# Patient Record
Sex: Female | Born: 1965
Health system: Southern US, Community
[De-identification: ages and names within clinical notes are randomized; demographics above are authoritative.]

## PROBLEM LIST (undated history)

## (undated) DIAGNOSIS — E559 Vitamin D deficiency, unspecified: Secondary | ICD-10-CM

## (undated) DIAGNOSIS — Z8489 Family history of other specified conditions: Secondary | ICD-10-CM

## (undated) DIAGNOSIS — R232 Flushing: Secondary | ICD-10-CM

## (undated) DIAGNOSIS — T7840XA Allergy, unspecified, initial encounter: Secondary | ICD-10-CM

## (undated) DIAGNOSIS — M1990 Unspecified osteoarthritis, unspecified site: Secondary | ICD-10-CM

## (undated) DIAGNOSIS — I10 Essential (primary) hypertension: Secondary | ICD-10-CM

## (undated) DIAGNOSIS — E876 Hypokalemia: Secondary | ICD-10-CM

## (undated) HISTORY — PX: CATARACT EXTRACTION: SUR2

## (undated) HISTORY — PX: WISDOM TOOTH EXTRACTION: SHX21

## (undated) HISTORY — PX: HERNIA REPAIR: SHX51

---

## 2010-03-16 ENCOUNTER — Emergency Department (HOSPITAL_COMMUNITY): Admission: EM | Admit: 2010-03-16 | Discharge: 2010-03-16 | Payer: Self-pay | Admitting: Family Medicine

## 2011-10-04 ENCOUNTER — Emergency Department (INDEPENDENT_AMBULATORY_CARE_PROVIDER_SITE_OTHER)
Admission: EM | Admit: 2011-10-04 | Discharge: 2011-10-04 | Disposition: A | Discharge status: Home / Self Care | Payer: 59 | Source: Home / Self Care

## 2011-10-04 DIAGNOSIS — T148XXA Other injury of unspecified body region, initial encounter: Secondary | ICD-10-CM

## 2011-10-04 DIAGNOSIS — M25569 Pain in unspecified knee: Secondary | ICD-10-CM

## 2011-10-04 HISTORY — DX: Essential (primary) hypertension: I10

## 2011-10-04 MED ORDER — CYCLOBENZAPRINE HCL 5 MG PO TABS: 5.0000 mg | ORAL_TABLET | Freq: Three times a day (TID) | ORAL | Status: AC | PRN

## 2011-10-04 NOTE — ED Provider Notes (Signed)
Medical screening examination/treatment/procedure(s) were performed by non-physician practitioner and as supervising physician I was immediately available for consultation/collaboration.  Raynald Blend, MD 10/04/11 2110
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
## 2013-03-24 ENCOUNTER — Encounter: Payer: Self-pay | Admitting: Nurse Practitioner

## 2013-03-24 ENCOUNTER — Ambulatory Visit (INDEPENDENT_AMBULATORY_CARE_PROVIDER_SITE_OTHER): Payer: 59 | Admitting: Nurse Practitioner

## 2013-03-24 VITALS — BP 122/58 | HR 66 | Ht 64.5 in | Wt 224.6 lb

## 2013-03-24 DIAGNOSIS — Z0000 Encounter for general adult medical examination without abnormal findings: Secondary | ICD-10-CM

## 2013-03-24 DIAGNOSIS — Z01419 Encounter for gynecological examination (general) (routine) without abnormal findings: Secondary | ICD-10-CM

## 2013-03-24 DIAGNOSIS — T8339XA Other mechanical complication of intrauterine contraceptive device, initial encounter: Secondary | ICD-10-CM

## 2013-03-24 DIAGNOSIS — T8332XA Displacement of intrauterine contraceptive device, initial encounter: Secondary | ICD-10-CM

## 2013-03-24 DIAGNOSIS — Z30433 Encounter for removal and reinsertion of intrauterine contraceptive device: Secondary | ICD-10-CM

## 2013-03-24 LAB — COMPREHENSIVE METABOLIC PANEL
AST: 15 U/L (ref 0–37)
Albumin: 3.7 g/dL (ref 3.5–5.2)
Alkaline Phosphatase: 57 U/L (ref 39–117)
BUN: 12 mg/dL (ref 6–23)
Potassium: 3.6 mEq/L (ref 3.5–5.3)
Sodium: 136 mEq/L (ref 135–145)
Total Bilirubin: 0.4 mg/dL (ref 0.3–1.2)
Total Protein: 6.6 g/dL (ref 6.0–8.3)

## 2013-03-24 LAB — LIPID PANEL
LDL Cholesterol: 107 mg/dL — ABNORMAL HIGH (ref 0–99)
VLDL: 30 mg/dL (ref 0–40)

## 2013-03-24 LAB — POCT URINALYSIS DIPSTICK
Leukocytes, UA: NEGATIVE
pH, UA: 5.5

## 2013-03-24 LAB — TSH: TSH: 2.703 u[IU]/mL (ref 0.350–4.500)

## 2013-03-24 NOTE — Patient Instructions (Signed)

## 2013-03-24 NOTE — Progress Notes (Signed)
47 y.o. G2. Single African American Fe here for annual exam.  Same partner for 4 years .Menses is regular lasting 7 days, moderate to light, some cramps. Has IUD for over 10 years.  (inserted after last pregnancy about 4-6 months later - per history last pregnancy was 1985). She is unsure as to which type of IUD and does not check her strings. She has no vaso symptoms.  Patient's last menstrual period was 03/14/2013.          Sexually active: yes  The current method of family planning is IUD.    Exercising: yes  walking Smoker:  no  Health Maintenance: Pap:  2012 normal MMG:  2012 normal done in [facility] Colonoscopy:  never TDaP: within 6 years per pt.  Labs: PCP does lab (blood) work.  Dr. Renae Fickle in [place]   reports that she has never smoked. She has never used smokeless tobacco. She reports that she does not drink alcohol or use illicit drugs.  Past Medical History  Diagnosis Date  . Hypertension   . Asthma     Past Surgical History  Procedure Laterality Date  . Cesarean section    . Hernia repair    . Wisdom tooth extraction      Current Outpatient Prescriptions  Medication Sig Dispense Refill  . aspirin 81 MG tablet Take 81 mg by mouth daily.      Marland Kitchen glucosamine-chondroitin 500-400 MG tablet Take 1 tablet by mouth 3 (three) times daily.      Marland Kitchen losartan-hydrochlorothiazide (HYZAAR) 50-12.5 MG per tablet Take 50 tablets by mouth.      . Mag Aspart-Potassium Aspart (POTASSIUM & MAGNESIUM ASPARTAT) 250-250 MG CAPS Take 10 mg by mouth.      . vitamin B-12 (CYANOCOBALAMIN) 100 MCG tablet Take 50 mcg by mouth daily.      Marland Kitchen olmesartan (BENICAR) 20 MG tablet Take 20 mg by mouth daily.         No current facility-administered medications for this visit.    Family History  Problem Relation Age of Onset  . Diabetes Mother   . Hypertension Mother   . Hypertension Father     ROS:  Pertinent items are noted in HPI.  Otherwise, a comprehensive ROS was negative.  Exam:    BP 122/58  Pulse 66  Ht 5' 4.5" (1.638 m)  Wt 224 lb 9.6 oz (101.878 kg)  BMI 37.97 kg/m2  LMP 03/14/2013 Height: 5' 4.5" (163.8 cm)  Ht Readings from Last 3 Encounters:  03/24/13 5' 4.5" (1.638 m)    General appearance: alert, cooperative and appears stated age Head: Normocephalic, without obvious abnormality, atraumatic Neck: no adenopathy, supple, symmetrical, trachea midline and thyroid normal to inspection and palpation Lungs: clear to auscultation bilaterally Breasts: normal appearance, no masses or tenderness Heart: regular rate and rhythm Abdomen: soft, non-tender; no masses,  no organomegaly, presence of a fairly large umbilical hernia Extremities: extremities normal, atraumatic, no cyanosis or edema Skin: Skin color, texture, turgor normal. No rashes or lesions Lymph nodes: Cervical, supraclavicular, and axillary nodes normal. No abnormal inguinal nodes palpated Neurologic: Grossly normal   Pelvic: External genitalia:  no lesions              Urethra:  normal appearing urethra with no masses, tenderness or lesions              Bartholin's and Skene's: normal  Vagina: normal appearing vagina with normal color and discharge, no lesions              Cervix: multiparous appearance, nabothian cyst and no IUD strings found after trying numerous times              Pap taken: yes Bimanual Exam:  Uterus:  normal size, contour, position, consistency, mobility, non-tender              Adnexa: no mass, fullness, tenderness               Rectovaginal: Confirms               Anus:  normal sphincter tone, no lesions  A:  Well Woman with normal exam  S/P Para guard IUD since 1985 needs removal and replacement  Unable to find IUD string  S/P umbilical hernia with past surgical repair with mesh with failure  HTN  P:   Pap smear as per guidelines   Mammogram due now  Follow for labs and send results to PCP  Will Schedule PUS for confirmation of IUD and removal with  replacement  counseled on breast self exam, family planning choices, adequate intake of calcium and vitamin D,   diet and exercise return annually or prn  An After Visit Summary was printed and given to the patient.

## 2013-03-25 ENCOUNTER — Telehealth: Payer: Self-pay | Admitting: *Deleted

## 2013-03-25 NOTE — Telephone Encounter (Signed)
Pt is aware of all lab results and will begin to take Vitamin D 2000 IU po qd (otc).

## 2013-03-25 NOTE — Telephone Encounter (Signed)
Message copied by Osie Bond on Fri Mar 25, 2013  9:12 AM ------      Message from: Roanna Banning      Created: Fri Mar 25, 2013  8:23 AM       Let patient know lab results. Her triglyceride is elevated so needs to watch foods that are high is sugars, TSH is noraml, Vit D was a little low start Vit D 2000 IU daily. Liver and kidney function test are normal. She wanted a copy for PCP. ------

## 2013-03-25 NOTE — Progress Notes (Signed)
Reviewed personally. 

## 2013-03-25 NOTE — Telephone Encounter (Signed)
Message copied by Osie Bond on Fri Mar 25, 2013  8:54 AM ------      Message from: Roanna Banning      Created: Fri Mar 25, 2013  8:23 AM       Let patient know lab results. Her triglyceride is elevated so needs to watch foods that are high is sugars, TSH is noraml, Vit D was a little low start Vit D 2000 IU daily. Liver and kidney function test are normal. She wanted a copy for PCP. ------

## 2013-03-25 NOTE — Telephone Encounter (Signed)
LVM for pt to return my call in regards to lab results.  

## 2013-03-28 ENCOUNTER — Telehealth: Payer: Self-pay | Admitting: Nurse Practitioner

## 2013-03-28 NOTE — Telephone Encounter (Signed)
Patient wants to know if she can schedule procedure a couple months out ahead of time coinciding with her cycle schedule?

## 2013-03-28 NOTE — Telephone Encounter (Signed)
LVM advising 100% covered and to call with her cycle to schedule during 1st 5 days.

## 2013-03-30 ENCOUNTER — Telehealth: Payer: Self-pay | Admitting: Nurse Practitioner

## 2013-03-30 NOTE — Telephone Encounter (Signed)
Needs to talk to Cherlyn Cushing nurse about ultrasound

## 2013-03-30 NOTE — Telephone Encounter (Signed)
Please call her she has questions about ultrasound wants to speak with [facility]

## 2013-03-31 ENCOUNTER — Telehealth: Payer: Self-pay | Admitting: *Deleted

## 2013-03-31 NOTE — Telephone Encounter (Signed)
Pt had question's in regards to IUD placement-scheduling. I advised pt she needs to be having her menses at insertion. Pt is agreeable and will call office at next menses.

## 2013-03-31 NOTE — Telephone Encounter (Signed)
LVM for pt to return my call-returning pt's call.

## 2013-04-18 ENCOUNTER — Telehealth: Payer: Self-pay | Admitting: Nurse Practitioner

## 2013-04-18 NOTE — Telephone Encounter (Signed)
LMTCB  aa    (Need to tell her that her Pap was normal.)

## 2013-04-18 NOTE — Telephone Encounter (Signed)
Patient has not gotten results from her PAP.  03-24-2013. Please call with results.

## 2013-04-18 NOTE — Telephone Encounter (Signed)
Spoke with pt to let her know Pap was normal. Pt requests copy be mailed to her.

## 2013-04-20 ENCOUNTER — Telehealth: Payer: Self-pay | Admitting: Nurse Practitioner

## 2013-04-20 NOTE — Telephone Encounter (Signed)
Patient needs her appointment for mammogram

## 2013-04-20 NOTE — Telephone Encounter (Signed)
LMTCB  aa 

## 2013-04-21 ENCOUNTER — Other Ambulatory Visit: Payer: Self-pay | Admitting: Nurse Practitioner

## 2013-04-21 DIAGNOSIS — Z1231 Encounter for screening mammogram for malignant neoplasm of breast: Secondary | ICD-10-CM

## 2013-04-21 NOTE — Telephone Encounter (Signed)
LM on pt's VM [facility] at [facility] am for [facility]G. Advised she will need to make sure it has been at least 365 days since her previous MMG as of Dec 4 in order for insurance to pay for it. Advised I would mail out an appt slip with a map to [facility] on the back. Pt to have old films transferred prior to appt. Pt to call back with any questions.

## 2013-04-21 NOTE — Telephone Encounter (Signed)
Spoke with pt about MMG appt. Pt had last one done at [facility] in [place], but would like to have next one done locally in December. Advised pt to call and have old films transferred for comparison. Will try [facility] for [facility] appt on Dec 4 per pt request.

## 2013-06-01 ENCOUNTER — Telehealth: Payer: Self-pay | Admitting: Nurse Practitioner

## 2013-06-01 NOTE — Telephone Encounter (Signed)
6/2 LMTCB to discuss insurance benefits and schedule pus guided IUD removal/insertion.  7/2LMTCB 8/6 LMTCB & message to Patty.

## 2013-06-02 NOTE — Telephone Encounter (Signed)
Patient returned my call. She wanted to let me know that she does not want to move forward with having an ultrasound to locate her IUD or to have it removed/replaced. Per Dr. Hyacinth Meeker,  I informed the patient that a letter was stuck in the mail today explaining that she has 30 days to make the appointment or we will not be able to provide her care anymore. The patient was slightly frustrated and felt "pressured" into having the procedure. I assured her that we are just trying to provide adequate care and make sure that the IUD is not causing any damage. She asked if she could have the scan but not the procedure. I informed her that if she had the scan and the IUD was not in the proper place it would have to be removed and replaced in order to prevent further damage. The patient ultimately decided that she will just find another doctor that will not make her have this scan and possible procedure completed.

## 2013-06-08 ENCOUNTER — Telehealth: Payer: Self-pay | Admitting: Nurse Practitioner

## 2013-06-23 NOTE — Telephone Encounter (Signed)
error 

## 2013-09-01 ENCOUNTER — Other Ambulatory Visit: Payer: Self-pay

## 2013-09-29 ENCOUNTER — Ambulatory Visit
Admission: RE | Admit: 2013-09-29 | Discharge: 2013-09-29 | Disposition: A | Discharge status: Home / Self Care | Payer: 59 | Source: Ambulatory Visit | Attending: Nurse Practitioner | Admitting: Nurse Practitioner

## 2013-09-29 DIAGNOSIS — Z1231 Encounter for screening mammogram for malignant neoplasm of breast: Secondary | ICD-10-CM

## 2013-09-30 ENCOUNTER — Other Ambulatory Visit: Payer: Self-pay | Admitting: Nurse Practitioner

## 2013-09-30 DIAGNOSIS — R928 Other abnormal and inconclusive findings on diagnostic imaging of breast: Secondary | ICD-10-CM

## 2013-10-17 ENCOUNTER — Other Ambulatory Visit: Payer: Self-pay | Admitting: Nurse Practitioner

## 2013-10-17 ENCOUNTER — Other Ambulatory Visit: Payer: Self-pay

## 2013-10-17 DIAGNOSIS — R928 Other abnormal and inconclusive findings on diagnostic imaging of breast: Secondary | ICD-10-CM

## 2013-10-21 ENCOUNTER — Ambulatory Visit
Admission: RE | Admit: 2013-10-21 | Discharge: 2013-10-21 | Disposition: A | Discharge status: Home / Self Care | Payer: 59 | Source: Ambulatory Visit | Attending: Nurse Practitioner | Admitting: Nurse Practitioner

## 2013-10-21 DIAGNOSIS — R928 Other abnormal and inconclusive findings on diagnostic imaging of breast: Secondary | ICD-10-CM

## 2014-01-09 ENCOUNTER — Other Ambulatory Visit: Payer: Self-pay | Admitting: Family Medicine

## 2014-01-09 DIAGNOSIS — N63 Unspecified lump in unspecified breast: Secondary | ICD-10-CM

## 2014-04-24 ENCOUNTER — Ambulatory Visit
Admission: RE | Admit: 2014-04-24 | Discharge: 2014-04-24 | Disposition: A | Discharge status: Home / Self Care | Payer: 59 | Source: Ambulatory Visit | Attending: Family Medicine | Admitting: Family Medicine

## 2014-04-24 ENCOUNTER — Other Ambulatory Visit: Payer: Self-pay | Admitting: Family Medicine

## 2014-04-24 DIAGNOSIS — N63 Unspecified lump in unspecified breast: Secondary | ICD-10-CM

## 2014-04-24 DIAGNOSIS — R921 Mammographic calcification found on diagnostic imaging of breast: Secondary | ICD-10-CM

## 2014-08-28 ENCOUNTER — Encounter: Payer: Self-pay | Admitting: Nurse Practitioner

## 2014-10-24 ENCOUNTER — Other Ambulatory Visit: Payer: Self-pay | Admitting: Family Medicine

## 2014-10-24 ENCOUNTER — Ambulatory Visit
Admission: RE | Admit: 2014-10-24 | Discharge: 2014-10-24 | Disposition: A | Discharge status: Home / Self Care | Payer: 59 | Source: Ambulatory Visit | Attending: Family Medicine | Admitting: Family Medicine

## 2014-10-24 DIAGNOSIS — N63 Unspecified lump in unspecified breast: Secondary | ICD-10-CM

## 2014-10-25 ENCOUNTER — Other Ambulatory Visit: Payer: Self-pay

## 2014-10-25 DIAGNOSIS — Z1231 Encounter for screening mammogram for malignant neoplasm of breast: Secondary | ICD-10-CM

## 2015-10-26 ENCOUNTER — Ambulatory Visit
Admission: RE | Admit: 2015-10-26 | Discharge: 2015-10-26 | Disposition: A | Discharge status: Home / Self Care | Payer: 59 | Source: Ambulatory Visit

## 2015-10-26 DIAGNOSIS — Z1231 Encounter for screening mammogram for malignant neoplasm of breast: Secondary | ICD-10-CM

## 2016-05-08 ENCOUNTER — Other Ambulatory Visit: Payer: Self-pay | Admitting: Family Medicine

## 2016-05-08 DIAGNOSIS — Z1231 Encounter for screening mammogram for malignant neoplasm of breast: Secondary | ICD-10-CM

## 2016-11-06 DIAGNOSIS — M546 Pain in thoracic spine: Secondary | ICD-10-CM | POA: Diagnosis not present

## 2016-11-06 DIAGNOSIS — M5442 Lumbago with sciatica, left side: Secondary | ICD-10-CM | POA: Diagnosis not present

## 2016-11-06 DIAGNOSIS — M5136 Other intervertebral disc degeneration, lumbar region: Secondary | ICD-10-CM | POA: Diagnosis not present

## 2016-11-19 ENCOUNTER — Ambulatory Visit (INDEPENDENT_AMBULATORY_CARE_PROVIDER_SITE_OTHER): Payer: 59 | Admitting: Sports Medicine

## 2016-11-19 ENCOUNTER — Ambulatory Visit
Admission: RE | Admit: 2016-11-19 | Discharge: 2016-11-19 | Disposition: A | Discharge status: Home / Self Care | Payer: 59 | Source: Ambulatory Visit | Attending: Family Medicine | Admitting: Family Medicine

## 2016-11-19 ENCOUNTER — Ambulatory Visit (INDEPENDENT_AMBULATORY_CARE_PROVIDER_SITE_OTHER): Payer: 59

## 2016-11-19 ENCOUNTER — Encounter: Payer: Self-pay | Admitting: Sports Medicine

## 2016-11-19 DIAGNOSIS — M2142 Flat foot [pes planus] (acquired), left foot: Secondary | ICD-10-CM | POA: Diagnosis not present

## 2016-11-19 DIAGNOSIS — M2141 Flat foot [pes planus] (acquired), right foot: Secondary | ICD-10-CM | POA: Diagnosis not present

## 2016-11-19 DIAGNOSIS — Z1231 Encounter for screening mammogram for malignant neoplasm of breast: Secondary | ICD-10-CM

## 2016-11-19 DIAGNOSIS — M722 Plantar fascial fibromatosis: Secondary | ICD-10-CM

## 2016-11-19 MED ORDER — TRIAMCINOLONE ACETONIDE 40 MG/ML IJ SUSP: 20.0000 mg | Freq: Once | INTRAMUSCULAR | Status: AC

## 2016-11-19 NOTE — Progress Notes (Signed)
Subjective: Lisa Everett is a 51 y.o. female patient presents to office with complaint of heel pain on the right>left. Patient admits to post static dyskinesia for 2 months in duration. Patient has treated this problem with change in shoes and inserts which help some. Denies any other pedal complaints.   There are no active problems to display for this patient.   Current Outpatient Prescriptions on File Prior to Visit  Medication Sig Dispense Refill  . aspirin 81 MG tablet Take 81 mg by mouth daily.    Marland Kitchen. glucosamine-chondroitin 500-400 MG tablet Take 1 tablet by mouth 3 (three) times daily.    Marland Kitchen. losartan-hydrochlorothiazide (HYZAAR) 50-12.5 MG per tablet Take 50 tablets by mouth.    . Mag Aspart-Potassium Aspart (POTASSIUM & MAGNESIUM ASPARTAT) 250-250 MG CAPS Take 10 mg by mouth.    . vitamin B-12 (CYANOCOBALAMIN) 100 MCG tablet Take 50 mcg by mouth daily.     No current facility-administered medications on file prior to visit.     No Known Allergies  Objective: Physical Exam General: The patient is alert and oriented x3 in no acute distress.  Dermatology: Skin is warm, dry and supple bilateral lower extremities. Nails 1-10 are normal. There is no erythema, edema, no eccymosis, no open lesions present. Integument is otherwise unremarkable.  Vascular: Dorsalis Pedis pulse and Posterior Tibial pulse are 2/4 bilateral. Capillary fill time is immediate to all digits.  Neurological: Grossly intact to light touch with an achilles reflex of +2/5 and a negative Tinel's sign bilateral.  Musculoskeletal: Tenderness to palpation at the medial calcaneal tubercale and through the insertion of the plantar fascia on the right foot. No pain to left. No pain with compression of calcaneus bilateral. No pain with tuning fork to calcaneus bilateral. No pain with calf compression bilateral. There is decreased Ankle joint range of motion bilateral. All other joints range of motion within normal limits  bilateral. Strength 5/5 in all groups bilateral.   Gait: Unassisted, Antalgic avoid weight on right heel  Xray, Right and Left foot:  Normal osseous mineralization. Joint spaces preserved. Pes planus foot type. No fracture/dislocation/boney destruction. Calcaneal spur present with mild thickening of plantar fascia R>L. No other soft tissue abnormalities or radiopaque foreign bodies.   Assessment and Plan: Problem List Items Addressed This Visit    None    Visit Diagnoses    Plantar fasciitis, bilateral    -  Primary   R>L   Relevant Orders   DG Foot 2 Views Left   DG Foot 2 Views Right   Plantar fasciitis of right foot       Relevant Medications   triamcinolone acetonide (KENALOG-40) injection 20 mg (Start on 11/19/2016 11:45 AM)   Pes planus of both feet          -Complete examination performed.  -Xrays reviewed -Discussed with patient in detail the condition of plantar fasciitis, how this occurs and general treatment options. Explained both conservative and surgical treatments.  -After oral consent and aseptic prep, injected a mixture containing 1 ml of 2% plain lidocaine, 1 ml 0.5% plain marcaine, 0.5 ml of kenalog 40 and 0.5 ml of dexamethasone phosphate into right heel. Post-injection care discussed with patient.  -Oral medication will be considered in moderation at next visit if symptoms are still present since hx of HTN -Recommended good supportive shoes and advised use of OTC insert. Explained to patient that if these orthoses work well, we will continue with these. If these do not improve her  condition and  pain, we will consider custom molded orthoses. - Explained in detail the use of the fascial brace for right and left which was dispensed at today's visit. -Explained and dispensed to patient daily stretching exercises. -Recommend patient to ice affected area 1-2x daily. -Patient to return to office in 3-4 weeks for follow up or sooner if problems or questions  arise.  Asencion Islam, DPM

## 2016-11-19 NOTE — Patient Instructions (Addendum)
Plantar Fasciitis Rehab Ask your health care provider which exercises are safe for you. Do exercises exactly as told by your health care provider and adjust them as directed. It is normal to feel mild stretching, pulling, tightness, or discomfort as you do these exercises, but you should stop right away if you feel sudden pain or your pain gets worse. Do not begin these exercises until told by your health care provider. Stretching and range of motion exercises These exercises warm up your muscles and joints and improve the movement and flexibility of your foot. These exercises also help to relieve pain. Exercise A: Plantar fascia stretch 1. Sit with your left / right leg crossed over your opposite knee. 2. Hold your heel with one hand with that thumb near your arch. With your other hand, hold your toes and gently pull them back toward the top of your foot. You should feel a stretch on the bottom of your toes or your foot or both. 3. Hold this stretch for__________ seconds. 4. Slowly release your toes and return to the starting position. Repeat __________ times. Complete this exercise __________ times a day. Exercise B: Gastroc, standing 1. Stand with your hands against a wall. 2. Extend your left / right leg behind you, and bend your front knee slightly. 3. Keeping your heels on the floor and keeping your back knee straight, shift your weight toward the wall without arching your back. You should feel a gentle stretch in your left / right calf. 4. Hold this position for __________ seconds. Repeat __________ times. Complete this exercise __________ times a day. Exercise C: Soleus, standing 1. Stand with your hands against a wall. 2. Extend your left / right leg behind you, and bend your front knee slightly. 3. Keeping your heels on the floor, bend your back knee and slightly shift your weight over the back leg. You should feel a gentle stretch deep in your calf. 4. Hold this position for __________  seconds. Repeat __________ times. Complete this exercise __________ times a day. Exercise D: Gastrocsoleus, standing 1. Stand with the ball of your left / right foot on a step. The ball of your foot is on the walking surface, right under your toes. 2. Keep your other foot firmly on the same step. 3. Hold onto the wall or a railing for balance. 4. Slowly lift your other foot, allowing your body weight to press your heel down over the edge of the step. You should feel a stretch in your left / right calf. 5. Hold this position for __________ seconds. 6. Return both feet to the step. 7. Repeat this exercise with a slight bend in your left / right knee. Repeat __________ times with your left / right knee straight and __________ times with your left / right knee bent. Complete this exercise __________ times a day. Balance exercise This exercise builds your balance and strength control of your arch to help take pressure off your plantar fascia. Exercise E: Single leg stand 1. Without shoes, stand near a railing or in a doorway. You may hold onto the railing or door frame as needed. 2. Stand on your left / right foot. Keep your big toe down on the floor and try to keep your arch lifted. Do not let your foot roll inward. 3. Hold this position for __________ seconds. 4. If this exercise is too easy, you can try it with your eyes closed or while standing on a pillow. Repeat __________ times. Complete this exercise   __________ times a day. This information is not intended to replace advice given to you by your health care provider. Make sure you discuss any questions you have with your health care provider. Document Released: 10/13/2005 Document Revised: 06/17/2016 Document Reviewed: 08/27/2015 Elsevier Interactive Patient Education  2017 [employer]  For tennis shoes recommend:  Anne ShutterBrooks Beast Ascis New balance Saucony Can be purchased at [employer] or [employer]  Vionic  SAS Can be purchased  at [employer] or [employer]   For work shoes recommend: [employer]ket[employer] Work Ryland [employer]berland boots  Can be purchased at a variety of places or Scientist, product/process developmenthoe Market   For casual shoes recommend: Vionic  Can be purchased at [employer] or [employer]   Over The counter Inserts Airplus- [email or web address] Superfeet- Omega sports or Fleetfeet Powersteps- Can be purchased from Dr. Wynema BirchStover's office

## 2016-11-22 DIAGNOSIS — Z79899 Other long term (current) drug therapy: Secondary | ICD-10-CM | POA: Diagnosis not present

## 2016-12-04 DIAGNOSIS — R6889 Other general symptoms and signs: Secondary | ICD-10-CM | POA: Diagnosis not present

## 2016-12-08 DIAGNOSIS — J189 Pneumonia, unspecified organism: Secondary | ICD-10-CM | POA: Diagnosis not present

## 2016-12-08 DIAGNOSIS — J301 Allergic rhinitis due to pollen: Secondary | ICD-10-CM | POA: Diagnosis not present

## 2016-12-10 ENCOUNTER — Ambulatory Visit: Payer: 59 | Admitting: Sports Medicine

## 2016-12-12 DIAGNOSIS — J189 Pneumonia, unspecified organism: Secondary | ICD-10-CM | POA: Diagnosis not present

## 2016-12-12 DIAGNOSIS — E538 Deficiency of other specified B group vitamins: Secondary | ICD-10-CM | POA: Diagnosis not present

## 2016-12-12 DIAGNOSIS — E785 Hyperlipidemia, unspecified: Secondary | ICD-10-CM | POA: Diagnosis not present

## 2016-12-12 DIAGNOSIS — E559 Vitamin D deficiency, unspecified: Secondary | ICD-10-CM | POA: Diagnosis not present

## 2016-12-12 DIAGNOSIS — J209 Acute bronchitis, unspecified: Secondary | ICD-10-CM | POA: Diagnosis not present

## 2016-12-18 DIAGNOSIS — M546 Pain in thoracic spine: Secondary | ICD-10-CM | POA: Diagnosis not present

## 2016-12-18 DIAGNOSIS — M5136 Other intervertebral disc degeneration, lumbar region: Secondary | ICD-10-CM | POA: Diagnosis not present

## 2016-12-18 DIAGNOSIS — M5442 Lumbago with sciatica, left side: Secondary | ICD-10-CM | POA: Diagnosis not present

## 2017-01-05 DIAGNOSIS — I10 Essential (primary) hypertension: Secondary | ICD-10-CM | POA: Diagnosis not present

## 2017-01-05 DIAGNOSIS — K439 Ventral hernia without obstruction or gangrene: Secondary | ICD-10-CM | POA: Diagnosis not present

## 2017-01-05 DIAGNOSIS — R1033 Periumbilical pain: Secondary | ICD-10-CM | POA: Diagnosis not present

## 2017-01-15 DIAGNOSIS — M5136 Other intervertebral disc degeneration, lumbar region: Secondary | ICD-10-CM | POA: Diagnosis not present

## 2017-01-15 DIAGNOSIS — M5442 Lumbago with sciatica, left side: Secondary | ICD-10-CM | POA: Diagnosis not present

## 2017-01-15 DIAGNOSIS — M546 Pain in thoracic spine: Secondary | ICD-10-CM | POA: Diagnosis not present

## 2017-02-12 DIAGNOSIS — M546 Pain in thoracic spine: Secondary | ICD-10-CM | POA: Diagnosis not present

## 2017-02-12 DIAGNOSIS — M5442 Lumbago with sciatica, left side: Secondary | ICD-10-CM | POA: Diagnosis not present

## 2017-02-12 DIAGNOSIS — M5136 Other intervertebral disc degeneration, lumbar region: Secondary | ICD-10-CM | POA: Diagnosis not present

## 2017-03-07 DIAGNOSIS — Z79899 Other long term (current) drug therapy: Secondary | ICD-10-CM | POA: Diagnosis not present

## 2017-03-07 DIAGNOSIS — R5383 Other fatigue: Secondary | ICD-10-CM | POA: Diagnosis not present

## 2017-03-07 DIAGNOSIS — E559 Vitamin D deficiency, unspecified: Secondary | ICD-10-CM | POA: Diagnosis not present

## 2017-03-07 DIAGNOSIS — R0602 Shortness of breath: Secondary | ICD-10-CM | POA: Diagnosis not present

## 2017-03-12 DIAGNOSIS — M5136 Other intervertebral disc degeneration, lumbar region: Secondary | ICD-10-CM | POA: Diagnosis not present

## 2017-03-12 DIAGNOSIS — M5442 Lumbago with sciatica, left side: Secondary | ICD-10-CM | POA: Diagnosis not present

## 2017-03-12 DIAGNOSIS — M546 Pain in thoracic spine: Secondary | ICD-10-CM | POA: Diagnosis not present

## 2017-04-18 DIAGNOSIS — E559 Vitamin D deficiency, unspecified: Secondary | ICD-10-CM | POA: Diagnosis not present

## 2017-04-18 DIAGNOSIS — I10 Essential (primary) hypertension: Secondary | ICD-10-CM | POA: Diagnosis not present

## 2017-04-18 DIAGNOSIS — E785 Hyperlipidemia, unspecified: Secondary | ICD-10-CM | POA: Diagnosis not present

## 2017-06-14 DIAGNOSIS — E782 Mixed hyperlipidemia: Secondary | ICD-10-CM | POA: Diagnosis not present

## 2017-06-14 DIAGNOSIS — Z79899 Other long term (current) drug therapy: Secondary | ICD-10-CM | POA: Diagnosis not present

## 2017-06-18 ENCOUNTER — Encounter (HOSPITAL_COMMUNITY): Payer: Self-pay | Admitting: Emergency Medicine

## 2017-06-18 ENCOUNTER — Ambulatory Visit (HOSPITAL_COMMUNITY)
Admission: EM | Admit: 2017-06-18 | Discharge: 2017-06-18 | Disposition: A | Discharge status: Home / Self Care | Payer: 59 | Attending: Emergency Medicine | Admitting: Emergency Medicine

## 2017-06-18 DIAGNOSIS — I10 Essential (primary) hypertension: Secondary | ICD-10-CM

## 2017-06-18 NOTE — ED Triage Notes (Signed)
PT reports she saw a doctor Sunday and her BP was 144/100. PT has had flucutations all week. Highest systolic reading was 185. Highest diastolic reading has been 113. PT reports she has felt "foggy".   PT's BP meds were switched 2-3 weeks ago. She used to take Losartan/HCTZ 50-12.5 mg.  She switched to Losartan 50mg . She was having issues with low potassium and wanted to drop the HCTZ.  PT went back to Losartan with HCTZ Tuesday due to high BP readings.

## 2017-06-18 NOTE — Discharge Instructions (Signed)
Continue taking Losartan HCT and follow up with PCP in next few weeks

## 2017-06-18 NOTE — ED Provider Notes (Signed)
  [facility]   [id number] 06/18/17 Arrival Time: 1808  ASSESSMENT & PLAN:  1. Essential hypertension     No orders of the defined types were placed in this encounter.   Reviewed expectations re: course of current medical issues. Questions answered. Outlined signs and symptoms indicating need for more acute intervention. Patient verbalized understanding. After Visit Summary given.  Explained to stay on losartan hct and follow up with pcp in next few weeks.  Try and eat banana or tomatoes.  SUBJECTIVE:  Lisa Everett is a 51 y.o. female who presents with complaint of having elevated bp readings on her losartan.  She started to take losartan hct.  ROS: As per HPI.   OBJECTIVE:  Vitals:   06/18/17 1858 06/18/17 1859  BP:  133/90  Pulse:  100  Resp:  16  Temp:  98.5 F (36.9 C)  TempSrc:  Oral  SpO2:  100%  Weight: 200 lb (90.7 kg)   Height: 5\' 2"  (1.575 m)      General appearance: alert; no distress Eyes: PERRLA; EOMI; conjunctiva normal HENT: normocephalic; atraumatic; TMs normal; nasal mucosa normal; oral mucosa normal Neck: supple Lungs: clear to auscultation bilaterally Heart: regular rate and rhythm Abdomen: soft, non-tender; bowel sounds normal; no masses or organomegaly; no guarding or rebound tenderness Back: no CVA tenderness Extremities: no cyanosis or edema; symmetrical with no gross deformities Skin: warm and dry Neurologic: normal gait; normal symmetric reflexes Psychological: alert and cooperative; normal mood and affect  Past Medical History:  Diagnosis Date  . Asthma   . Hypertension      has a past medical history of Asthma and Hypertension.    Labs Reviewed - No data to display  Imaging: No results found.  No Known Allergies  Family History  Problem Relation Age of Onset  . Diabetes Mother   . Hypertension Mother   . Hypertension Father   . Osteoarthritis Sister   . Diabetes Paternal Grandmother   . Diabetes  Paternal Aunt    Past Surgical History:  Procedure Laterality Date  . CESAREAN SECTION    . HERNIA REPAIR    . WISDOM TOOTH EXTRACTION           Deatra Canter, [place] 06/18/17 [phone]

## 2017-06-24 DIAGNOSIS — E559 Vitamin D deficiency, unspecified: Secondary | ICD-10-CM | POA: Diagnosis not present

## 2017-06-24 DIAGNOSIS — I10 Essential (primary) hypertension: Secondary | ICD-10-CM | POA: Diagnosis not present

## 2017-06-24 DIAGNOSIS — E785 Hyperlipidemia, unspecified: Secondary | ICD-10-CM | POA: Diagnosis not present

## 2017-08-01 DIAGNOSIS — E559 Vitamin D deficiency, unspecified: Secondary | ICD-10-CM | POA: Diagnosis not present

## 2017-08-01 DIAGNOSIS — E538 Deficiency of other specified B group vitamins: Secondary | ICD-10-CM | POA: Diagnosis not present

## 2017-08-01 DIAGNOSIS — I10 Essential (primary) hypertension: Secondary | ICD-10-CM | POA: Diagnosis not present

## 2017-10-03 DIAGNOSIS — E559 Vitamin D deficiency, unspecified: Secondary | ICD-10-CM | POA: Diagnosis not present

## 2017-10-03 DIAGNOSIS — E785 Hyperlipidemia, unspecified: Secondary | ICD-10-CM | POA: Diagnosis not present

## 2017-10-03 DIAGNOSIS — I10 Essential (primary) hypertension: Secondary | ICD-10-CM | POA: Diagnosis not present

## 2017-10-03 DIAGNOSIS — E539 Vitamin B deficiency, unspecified: Secondary | ICD-10-CM | POA: Diagnosis not present

## 2017-12-12 DIAGNOSIS — E785 Hyperlipidemia, unspecified: Secondary | ICD-10-CM | POA: Diagnosis not present

## 2017-12-12 DIAGNOSIS — K439 Ventral hernia without obstruction or gangrene: Secondary | ICD-10-CM | POA: Diagnosis not present

## 2017-12-12 DIAGNOSIS — I10 Essential (primary) hypertension: Secondary | ICD-10-CM | POA: Diagnosis not present

## 2017-12-16 ENCOUNTER — Other Ambulatory Visit: Payer: Self-pay | Admitting: Internal Medicine

## 2017-12-16 DIAGNOSIS — Z139 Encounter for screening, unspecified: Secondary | ICD-10-CM

## 2017-12-25 ENCOUNTER — Other Ambulatory Visit (HOSPITAL_COMMUNITY): Payer: Self-pay | Admitting: Surgery

## 2017-12-25 DIAGNOSIS — K430 Incisional hernia with obstruction, without gangrene: Secondary | ICD-10-CM

## 2018-01-04 ENCOUNTER — Encounter (HOSPITAL_COMMUNITY): Payer: Self-pay

## 2018-01-04 ENCOUNTER — Ambulatory Visit (HOSPITAL_COMMUNITY)
Admission: RE | Admit: 2018-01-04 | Discharge: 2018-01-04 | Disposition: A | Discharge status: Home / Self Care | Payer: 59 | Source: Ambulatory Visit | Attending: Surgery | Admitting: Surgery

## 2018-01-04 ENCOUNTER — Ambulatory Visit
Admission: RE | Admit: 2018-01-04 | Discharge: 2018-01-04 | Disposition: A | Discharge status: Home / Self Care | Payer: 59 | Source: Ambulatory Visit | Attending: Internal Medicine | Admitting: Internal Medicine

## 2018-01-04 DIAGNOSIS — Z1231 Encounter for screening mammogram for malignant neoplasm of breast: Secondary | ICD-10-CM | POA: Diagnosis not present

## 2018-01-04 DIAGNOSIS — K430 Incisional hernia with obstruction, without gangrene: Secondary | ICD-10-CM | POA: Insufficient documentation

## 2018-01-04 DIAGNOSIS — K439 Ventral hernia without obstruction or gangrene: Secondary | ICD-10-CM | POA: Diagnosis not present

## 2018-01-04 DIAGNOSIS — K5730 Diverticulosis of large intestine without perforation or abscess without bleeding: Secondary | ICD-10-CM | POA: Insufficient documentation

## 2018-01-04 DIAGNOSIS — Z139 Encounter for screening, unspecified: Secondary | ICD-10-CM

## 2018-01-04 MED ORDER — IOPAMIDOL (ISOVUE-300) INJECTION 61%
100.0000 mL | Freq: Once | INTRAVENOUS | Status: AC | PRN
  Administered 2018-01-04: 100 mL via INTRAVENOUS

## 2018-01-04 MED ORDER — IOPAMIDOL (ISOVUE-300) INJECTION 61%
INTRAVENOUS | Status: AC
  Administered 2018-01-04: 100 mL via INTRAVENOUS
  Filled 2018-01-04: qty 100

## 2018-01-04 MED ORDER — SODIUM CHLORIDE 0.9 % IJ SOLN
INTRAMUSCULAR | Status: AC
  Filled 2018-01-04: qty 50

## 2018-01-27 ENCOUNTER — Encounter: Payer: Self-pay | Admitting: Podiatry

## 2018-01-27 ENCOUNTER — Ambulatory Visit: Payer: 59 | Admitting: Podiatry

## 2018-01-27 DIAGNOSIS — M722 Plantar fascial fibromatosis: Secondary | ICD-10-CM | POA: Diagnosis not present

## 2018-01-27 DIAGNOSIS — Z6836 Body mass index (BMI) 36.0-36.9, adult: Secondary | ICD-10-CM | POA: Diagnosis not present

## 2018-01-27 DIAGNOSIS — K430 Incisional hernia with obstruction, without gangrene: Secondary | ICD-10-CM | POA: Diagnosis not present

## 2018-01-27 DIAGNOSIS — Z01419 Encounter for gynecological examination (general) (routine) without abnormal findings: Secondary | ICD-10-CM | POA: Diagnosis not present

## 2018-01-27 MED ORDER — MELOXICAM 15 MG PO TABS: 15.0000 mg | ORAL_TABLET | Freq: Every day | ORAL | 1 refills | Status: DC

## 2018-01-29 ENCOUNTER — Telehealth: Payer: Self-pay | Admitting: Podiatry

## 2018-01-29 NOTE — Telephone Encounter (Signed)
Left message for pt to call to discuss orthotic benefit coverage. °

## 2018-01-29 NOTE — Progress Notes (Signed)
   Subjective:  52 year old female presenting today with a chief complaint of right heel pain and flat feet bilaterally. She has been diagnosed with plantar fasciitis in the past and believes she is having a flare up. The first steps out of bed in the morning are the worst but walking and standing for long periods of time also increase the pain. She states the injection she has received in the past have helped alleviate the pain for a few months. Patient is here for further evaluation and treatment.   Past Medical History:  Diagnosis Date  . Asthma   . Hypertension       Objective/Physical Exam General: The patient is alert and oriented x3 in no acute distress.  Dermatology: Skin is warm, dry and supple bilateral lower extremities. Negative for open lesions or macerations.  Vascular: Palpable pedal pulses bilaterally. No edema or erythema noted. Capillary refill within normal limits.  Neurological: Epicritic and protective threshold grossly intact bilaterally.   Musculoskeletal Exam: Range of motion within normal limits to all pedal and ankle joints bilateral. Muscle strength 5/5 in all groups bilateral.  Upon weightbearing there is a medial longitudinal arch collapse bilaterally. Remove foot valgus noted to the bilateral lower extremities with excessive pronation upon mid stance. Tenderness to palpation to the plantar aspect of the right heel along the plantar fascia.  Assessment: 1. pes planus bilateral 2. pain in bilateral feet 3. plantar fasciitis right    Plan of Care:  1. Patient was evaluated. 2. Injection of 0.5 mLs Celestone Soluspan injected into the right heel.  3. Prescription for Meloxicam provided to patient.  4. Orthotics department to check orthotics coverage. 5. Continue wearing plantar fascial brace. 6. Return to clinic as needed.   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    Felecia ShellingBrent M. Yuka Lallier, DPM [facility]  Dr. Felecia ShellingBrent M. Porsha Skilton, DPM      [address]                                        [place], [place] [address]                Office [phone]  Fax [phone]

## 2018-02-06 DIAGNOSIS — I10 Essential (primary) hypertension: Secondary | ICD-10-CM | POA: Diagnosis not present

## 2018-02-06 DIAGNOSIS — E538 Deficiency of other specified B group vitamins: Secondary | ICD-10-CM | POA: Diagnosis not present

## 2018-02-06 DIAGNOSIS — E559 Vitamin D deficiency, unspecified: Secondary | ICD-10-CM | POA: Diagnosis not present

## 2018-02-06 DIAGNOSIS — E785 Hyperlipidemia, unspecified: Secondary | ICD-10-CM | POA: Diagnosis not present

## 2018-02-18 ENCOUNTER — Encounter (HOSPITAL_COMMUNITY): Payer: Self-pay | Admitting: Emergency Medicine

## 2018-02-18 ENCOUNTER — Other Ambulatory Visit: Payer: Self-pay

## 2018-02-18 ENCOUNTER — Ambulatory Visit (HOSPITAL_COMMUNITY)
Admission: EM | Admit: 2018-02-18 | Discharge: 2018-02-18 | Disposition: A | Discharge status: Home / Self Care | Payer: 59 | Attending: Internal Medicine | Admitting: Internal Medicine

## 2018-02-18 DIAGNOSIS — J019 Acute sinusitis, unspecified: Secondary | ICD-10-CM | POA: Diagnosis not present

## 2018-02-18 MED ORDER — AMOXICILLIN-POT CLAVULANATE 875-125 MG PO TABS: 1.0000 | ORAL_TABLET | Freq: Two times a day (BID) | ORAL | 0 refills | Status: AC

## 2018-02-18 MED ORDER — BENZONATATE 200 MG PO CAPS: 200.0000 mg | ORAL_CAPSULE | Freq: Three times a day (TID) | ORAL | 0 refills | Status: AC | PRN

## 2018-02-18 MED ORDER — PREDNISONE 50 MG PO TABS: 50.0000 mg | ORAL_TABLET | Freq: Every day | ORAL | 0 refills | Status: AC

## 2018-02-18 MED ORDER — FLUTICASONE PROPIONATE 50 MCG/ACT NA SUSP: 1.0000 | Freq: Every day | NASAL | 0 refills | Status: AC

## 2018-02-18 NOTE — ED Triage Notes (Signed)
Patient has had symptoms for more than a week.  Patient reports coughing sinus drainage, particularly at night, chest congestion.

## 2018-02-18 NOTE — ED Provider Notes (Signed)
[facility]    CSN: [id number] Arrival date & time: 02/18/18  1736     History   Chief Complaint Chief Complaint  Patient presents with  . URI    HPI Lisa Everett is a 52 y.o. female history of asthma and hypertension presenting today for evaluation of URI symptoms.  Patient has been having cough, congestion, rhinorrhea, body aches and phlegm production for over a week.  Patient has been taking over-the-counter cold medicine as well as Zyrtec.  Also taking Coricidin.  Using humidifier at home.  Feels like symptoms worse at night.  Denies sore throat.  Denies nausea, vomiting, abdominal pain, diarrhea.  Denies fevers.  HPI  Past Medical History:  Diagnosis Date  . Asthma   . Hypertension     There are no active problems to display for this patient.   Past Surgical History:  Procedure Laterality Date  . CESAREAN SECTION    . HERNIA REPAIR    . WISDOM TOOTH EXTRACTION      OB History    Gravida  2   Para  2   Term      Preterm      AB      Living  2     SAB      TAB      Ectopic      Multiple      Live Births               Home Medications    Prior to Admission medications   Medication Sig Start Date End Date Taking? Authorizing Provider  BLACK COHOSH EXTRACT PO Take by mouth.   Yes [provider]  cetirizine (ZYRTEC) 10 MG chewable tablet Chew 10 mg by mouth daily.   Yes [provider]  Chlorpheniramine-DM (CORICIDIN HBP COUGH/COLD PO) Take by mouth.   Yes [provider]  LISINOPRIL PO Take by mouth.   Yes [provider]  POTASSIUM CHLORIDE ER PO Take by mouth.   Yes [provider]  amoxicillin-clavulanate (AUGMENTIN) 875-125 MG tablet Take 1 tablet by mouth 2 (two) times daily for 10 days. 02/18/18 02/28/18  Wieters, Hallie C, PA-C  aspirin 81 MG tablet Take 81 mg by mouth daily.    [provider]  benzonatate (TESSALON) 200 MG capsule Take 1 capsule (200 mg total) by  mouth 3 (three) times daily as needed for up to 7 days for cough. 02/18/18 02/25/18  Wieters, Hallie C, PA-C  fluticasone (FLONASE) 50 MCG/ACT nasal spray Place 1-2 sprays into both nostrils daily for 7 days. 02/18/18 02/25/18  Wieters, Hallie C, PA-C  glucosamine-chondroitin 500-400 MG tablet Take 1 tablet by mouth 3 (three) times daily.    [provider]  Lorcaserin HCl ER (BELVIQ XR) 20 MG TB24  04/15/16   [provider]  losartan-hydrochlorothiazide (HYZAAR) 50-12.5 MG per tablet Take 50 tablets by mouth. 01/05/13   [provider]  Mag Aspart-Potassium Aspart (POTASSIUM & MAGNESIUM ASPARTAT) 250-250 MG CAPS Take 10 mg by mouth. 01/05/13   [provider]  meloxicam (MOBIC) 15 MG tablet Take 1 tablet (15 mg total) by mouth daily. 01/27/18 02/26/18  Felecia ShellingEvans, Brent M, DPM  ondansetron (ZOFRAN-ODT) 4 MG disintegrating tablet DISSOLVE 1 TABLET BY MOUTH EVERY FOUR-SIX HOURS AS NEEDED 09/26/16   [provider]  predniSONE (DELTASONE) 50 MG tablet Take 1 tablet (50 mg total) by mouth daily for 5 days. 02/18/18 02/23/18  Wieters, Hallie C, PA-C  vitamin B-12 (  CYANOCOBALAMIN) 100 MCG tablet Take 50 mcg by mouth daily.    [provider]    Family History Family History  Problem Relation Age of Onset  . Diabetes Mother   . Hypertension Mother   . Hypertension Father   . Osteoarthritis Sister   . Diabetes Paternal Grandmother   . Diabetes Paternal Aunt     Social History Social History   Tobacco Use  . Smoking status: Never Smoker  . Smokeless tobacco: Never Used  Substance Use Topics  . Alcohol use: No  . Drug use: No     Allergies   Patient has no known allergies.   Review of Systems Review of Systems  Constitutional: Positive for fatigue. Negative for chills and fever.  HENT: Positive for congestion, rhinorrhea and sinus pressure. Negative for ear pain, sore throat and trouble swallowing.   Respiratory: Positive for cough. Negative for  chest tightness and shortness of breath.   Cardiovascular: Negative for chest pain.  Gastrointestinal: Negative for abdominal pain, nausea and vomiting.  Musculoskeletal: Positive for myalgias.  Skin: Negative for rash.  Neurological: Negative for dizziness, light-headedness and headaches.     Physical Exam Triage Vital Signs ED Triage Vitals  Enc Vitals Group     BP 02/18/18 1817 133/86     Pulse Rate 02/18/18 1817 78     Resp 02/18/18 1817 18     Temp 02/18/18 1817 98.5 F (36.9 C)     Temp Source 02/18/18 1817 Oral     SpO2 02/18/18 1817 100 %     Weight --      Height --      Head Circumference --      Peak Flow --      Pain Score 02/18/18 1812 4     Pain Loc --      Pain Edu? --      Excl. in GC? --    No data found.  Updated Vital Signs BP 133/86 (BP Location: Left Arm) Comment (BP Location): large cuff  Pulse 78   Temp 98.5 F (36.9 C) (Oral)   Resp 18   SpO2 100%   Visual Acuity Right Eye Distance:   Left Eye Distance:   Bilateral Distance:    Right Eye Near:   Left Eye Near:    Bilateral Near:     Physical Exam  Constitutional: She is oriented to person, place, and time. She appears well-developed and well-nourished. No distress.  HENT:  Head: Normocephalic and atraumatic.  Bilateral TMs are erythematous, nasal mucosa erythematous with clear rhinorrhea present, posterior pharynx erythematous, no tonsillar enlargement or exudate.  Eyes: Conjunctivae are normal.  Neck: Neck supple.  Cardiovascular: Normal rate and regular rhythm.  No murmur heard. Pulmonary/Chest: Effort normal and breath sounds normal. No respiratory distress.  Breathing comfortably at rest, CTA BL  Musculoskeletal: She exhibits no edema.  Neurological: She is alert and oriented to person, place, and time.  Skin: Skin is warm and dry.  Psychiatric: She has a normal mood and affect.  Nursing note and vitals reviewed.    UC Treatments / Results  Labs (all labs ordered are  listed, but only abnormal results are displayed) Labs Reviewed - No data to display  EKG None Radiology No results found.  Procedures Procedures (including critical care time)  Medications Ordered in UC Medications - No data to display   Initial Impression / Assessment and Plan / UC Course  I have reviewed the triage vital signs and  the nursing notes.  Pertinent labs & imaging results that were available during my care of the patient were reviewed by me and considered in my medical decision making (see chart for details).     Patient with URI symptoms for over 1 week, concerning for acute sinusitis, will treat with Augmentin for 10 days, prednisone for 5 days.  Will continue Zyrtec, add in Flonase, Tessalon for cough.  Patient requesting Rocephin shot, request declined given vital signs stable.   Final Clinical Impressions(s) / UC Diagnoses   Final diagnoses:  Acute sinusitis with symptoms > 10 days    ED Discharge Orders        Ordered    amoxicillin-clavulanate (AUGMENTIN) 875-125 MG tablet  2 times daily     02/18/18 1837    predniSONE (DELTASONE) 50 MG tablet  Daily     02/18/18 1837    benzonatate (TESSALON) 200 MG capsule  3 times daily PRN     02/18/18 1837    fluticasone (FLONASE) 50 MCG/ACT nasal spray  Daily     02/18/18 1837       Controlled Substance Prescriptions [place] Controlled Substance Registry consulted? Not Applicable   Lew Dawes, [place] 02/18/18 1847

## 2018-02-18 NOTE — Discharge Instructions (Signed)
Please begin Augmentin twice daily for the next 10 days.  Please begin prednisone daily for the next 5 days.  Continue daily Zyrtec that you are given by your PCP, please add in Flonase nasal spray to help with congestion.  Tessalon for cough.

## 2018-03-17 ENCOUNTER — Other Ambulatory Visit: Payer: Self-pay | Admitting: Podiatry

## 2018-03-29 DIAGNOSIS — S86919A Strain of unspecified muscle(s) and tendon(s) at lower leg level, unspecified leg, initial encounter: Secondary | ICD-10-CM | POA: Diagnosis not present

## 2018-03-29 DIAGNOSIS — M25561 Pain in right knee: Secondary | ICD-10-CM | POA: Diagnosis not present

## 2018-04-06 DIAGNOSIS — M25561 Pain in right knee: Secondary | ICD-10-CM | POA: Diagnosis not present

## 2018-04-06 DIAGNOSIS — M9906 Segmental and somatic dysfunction of lower extremity: Secondary | ICD-10-CM | POA: Diagnosis not present

## 2018-04-06 DIAGNOSIS — M25562 Pain in left knee: Secondary | ICD-10-CM | POA: Diagnosis not present

## 2018-04-08 DIAGNOSIS — K432 Incisional hernia without obstruction or gangrene: Secondary | ICD-10-CM | POA: Diagnosis not present

## 2018-04-10 DIAGNOSIS — E559 Vitamin D deficiency, unspecified: Secondary | ICD-10-CM | POA: Diagnosis not present

## 2018-04-10 DIAGNOSIS — M25561 Pain in right knee: Secondary | ICD-10-CM | POA: Diagnosis not present

## 2018-04-10 DIAGNOSIS — E538 Deficiency of other specified B group vitamins: Secondary | ICD-10-CM | POA: Diagnosis not present

## 2018-04-12 DIAGNOSIS — M2142 Flat foot [pes planus] (acquired), left foot: Secondary | ICD-10-CM | POA: Diagnosis not present

## 2018-04-12 DIAGNOSIS — M9906 Segmental and somatic dysfunction of lower extremity: Secondary | ICD-10-CM | POA: Diagnosis not present

## 2018-04-12 DIAGNOSIS — M25562 Pain in left knee: Secondary | ICD-10-CM | POA: Diagnosis not present

## 2018-04-12 DIAGNOSIS — M25561 Pain in right knee: Secondary | ICD-10-CM | POA: Diagnosis not present

## 2018-04-14 DIAGNOSIS — M2142 Flat foot [pes planus] (acquired), left foot: Secondary | ICD-10-CM | POA: Diagnosis not present

## 2018-04-14 DIAGNOSIS — M25562 Pain in left knee: Secondary | ICD-10-CM | POA: Diagnosis not present

## 2018-04-14 DIAGNOSIS — M9906 Segmental and somatic dysfunction of lower extremity: Secondary | ICD-10-CM | POA: Diagnosis not present

## 2018-04-14 DIAGNOSIS — M25561 Pain in right knee: Secondary | ICD-10-CM | POA: Diagnosis not present

## 2018-04-15 DIAGNOSIS — M9906 Segmental and somatic dysfunction of lower extremity: Secondary | ICD-10-CM | POA: Diagnosis not present

## 2018-04-15 DIAGNOSIS — M2142 Flat foot [pes planus] (acquired), left foot: Secondary | ICD-10-CM | POA: Diagnosis not present

## 2018-04-15 DIAGNOSIS — M25562 Pain in left knee: Secondary | ICD-10-CM | POA: Diagnosis not present

## 2018-04-15 DIAGNOSIS — M25561 Pain in right knee: Secondary | ICD-10-CM | POA: Diagnosis not present

## 2018-04-19 DIAGNOSIS — M25562 Pain in left knee: Secondary | ICD-10-CM | POA: Diagnosis not present

## 2018-04-19 DIAGNOSIS — M2142 Flat foot [pes planus] (acquired), left foot: Secondary | ICD-10-CM | POA: Diagnosis not present

## 2018-04-19 DIAGNOSIS — M25561 Pain in right knee: Secondary | ICD-10-CM | POA: Diagnosis not present

## 2018-04-19 DIAGNOSIS — M9906 Segmental and somatic dysfunction of lower extremity: Secondary | ICD-10-CM | POA: Diagnosis not present

## 2018-04-21 DIAGNOSIS — M9906 Segmental and somatic dysfunction of lower extremity: Secondary | ICD-10-CM | POA: Diagnosis not present

## 2018-04-21 DIAGNOSIS — M25561 Pain in right knee: Secondary | ICD-10-CM | POA: Diagnosis not present

## 2018-04-21 DIAGNOSIS — M25562 Pain in left knee: Secondary | ICD-10-CM | POA: Diagnosis not present

## 2018-04-21 DIAGNOSIS — M2142 Flat foot [pes planus] (acquired), left foot: Secondary | ICD-10-CM | POA: Diagnosis not present

## 2018-04-22 DIAGNOSIS — M9906 Segmental and somatic dysfunction of lower extremity: Secondary | ICD-10-CM | POA: Diagnosis not present

## 2018-04-22 DIAGNOSIS — M25562 Pain in left knee: Secondary | ICD-10-CM | POA: Diagnosis not present

## 2018-04-22 DIAGNOSIS — M2142 Flat foot [pes planus] (acquired), left foot: Secondary | ICD-10-CM | POA: Diagnosis not present

## 2018-04-22 DIAGNOSIS — M25561 Pain in right knee: Secondary | ICD-10-CM | POA: Diagnosis not present

## 2018-04-26 DIAGNOSIS — M9906 Segmental and somatic dysfunction of lower extremity: Secondary | ICD-10-CM | POA: Diagnosis not present

## 2018-04-26 DIAGNOSIS — M25562 Pain in left knee: Secondary | ICD-10-CM | POA: Diagnosis not present

## 2018-04-26 DIAGNOSIS — M25561 Pain in right knee: Secondary | ICD-10-CM | POA: Diagnosis not present

## 2018-04-26 DIAGNOSIS — M2142 Flat foot [pes planus] (acquired), left foot: Secondary | ICD-10-CM | POA: Diagnosis not present

## 2018-04-27 DIAGNOSIS — M9906 Segmental and somatic dysfunction of lower extremity: Secondary | ICD-10-CM | POA: Diagnosis not present

## 2018-04-27 DIAGNOSIS — M2142 Flat foot [pes planus] (acquired), left foot: Secondary | ICD-10-CM | POA: Diagnosis not present

## 2018-04-27 DIAGNOSIS — M25561 Pain in right knee: Secondary | ICD-10-CM | POA: Diagnosis not present

## 2018-04-27 DIAGNOSIS — M25562 Pain in left knee: Secondary | ICD-10-CM | POA: Diagnosis not present

## 2018-05-05 DIAGNOSIS — M25561 Pain in right knee: Secondary | ICD-10-CM | POA: Diagnosis not present

## 2018-05-05 DIAGNOSIS — M2142 Flat foot [pes planus] (acquired), left foot: Secondary | ICD-10-CM | POA: Diagnosis not present

## 2018-05-05 DIAGNOSIS — M9906 Segmental and somatic dysfunction of lower extremity: Secondary | ICD-10-CM | POA: Diagnosis not present

## 2018-05-05 DIAGNOSIS — M25562 Pain in left knee: Secondary | ICD-10-CM | POA: Diagnosis not present

## 2018-05-08 DIAGNOSIS — Z79899 Other long term (current) drug therapy: Secondary | ICD-10-CM | POA: Diagnosis not present

## 2018-05-08 DIAGNOSIS — E785 Hyperlipidemia, unspecified: Secondary | ICD-10-CM | POA: Diagnosis not present

## 2018-05-08 DIAGNOSIS — Z0000 Encounter for general adult medical examination without abnormal findings: Secondary | ICD-10-CM | POA: Diagnosis not present

## 2018-05-08 DIAGNOSIS — I10 Essential (primary) hypertension: Secondary | ICD-10-CM | POA: Diagnosis not present

## 2018-05-10 DIAGNOSIS — M9906 Segmental and somatic dysfunction of lower extremity: Secondary | ICD-10-CM | POA: Diagnosis not present

## 2018-05-10 DIAGNOSIS — M25562 Pain in left knee: Secondary | ICD-10-CM | POA: Diagnosis not present

## 2018-05-10 DIAGNOSIS — M25561 Pain in right knee: Secondary | ICD-10-CM | POA: Diagnosis not present

## 2018-05-10 DIAGNOSIS — M2142 Flat foot [pes planus] (acquired), left foot: Secondary | ICD-10-CM | POA: Diagnosis not present

## 2018-05-11 DIAGNOSIS — M25561 Pain in right knee: Secondary | ICD-10-CM | POA: Diagnosis not present

## 2018-05-11 DIAGNOSIS — M25562 Pain in left knee: Secondary | ICD-10-CM | POA: Diagnosis not present

## 2018-05-11 DIAGNOSIS — M9906 Segmental and somatic dysfunction of lower extremity: Secondary | ICD-10-CM | POA: Diagnosis not present

## 2018-05-11 DIAGNOSIS — M2142 Flat foot [pes planus] (acquired), left foot: Secondary | ICD-10-CM | POA: Diagnosis not present

## 2018-05-12 DIAGNOSIS — M25561 Pain in right knee: Secondary | ICD-10-CM | POA: Diagnosis not present

## 2018-05-12 DIAGNOSIS — M9906 Segmental and somatic dysfunction of lower extremity: Secondary | ICD-10-CM | POA: Diagnosis not present

## 2018-05-12 DIAGNOSIS — M2142 Flat foot [pes planus] (acquired), left foot: Secondary | ICD-10-CM | POA: Diagnosis not present

## 2018-05-12 DIAGNOSIS — M25562 Pain in left knee: Secondary | ICD-10-CM | POA: Diagnosis not present

## 2018-05-15 DIAGNOSIS — M25561 Pain in right knee: Secondary | ICD-10-CM | POA: Diagnosis not present

## 2018-05-15 DIAGNOSIS — E538 Deficiency of other specified B group vitamins: Secondary | ICD-10-CM | POA: Diagnosis not present

## 2018-05-15 DIAGNOSIS — E559 Vitamin D deficiency, unspecified: Secondary | ICD-10-CM | POA: Diagnosis not present

## 2018-05-17 ENCOUNTER — Ambulatory Visit (INDEPENDENT_AMBULATORY_CARE_PROVIDER_SITE_OTHER): Payer: 59 | Admitting: Orthotics

## 2018-05-17 ENCOUNTER — Ambulatory Visit: Payer: 59 | Admitting: Podiatry

## 2018-05-17 DIAGNOSIS — M722 Plantar fascial fibromatosis: Secondary | ICD-10-CM | POA: Diagnosis not present

## 2018-05-17 DIAGNOSIS — M25561 Pain in right knee: Secondary | ICD-10-CM | POA: Diagnosis not present

## 2018-05-17 NOTE — Progress Notes (Signed)

## 2018-05-19 DIAGNOSIS — M25561 Pain in right knee: Secondary | ICD-10-CM | POA: Diagnosis not present

## 2018-05-19 DIAGNOSIS — M25562 Pain in left knee: Secondary | ICD-10-CM | POA: Diagnosis not present

## 2018-05-19 DIAGNOSIS — M9906 Segmental and somatic dysfunction of lower extremity: Secondary | ICD-10-CM | POA: Diagnosis not present

## 2018-05-19 DIAGNOSIS — M2142 Flat foot [pes planus] (acquired), left foot: Secondary | ICD-10-CM | POA: Diagnosis not present

## 2018-05-27 DIAGNOSIS — M25561 Pain in right knee: Secondary | ICD-10-CM | POA: Diagnosis not present

## 2018-05-27 DIAGNOSIS — M25562 Pain in left knee: Secondary | ICD-10-CM | POA: Diagnosis not present

## 2018-05-27 DIAGNOSIS — M9906 Segmental and somatic dysfunction of lower extremity: Secondary | ICD-10-CM | POA: Diagnosis not present

## 2018-05-27 DIAGNOSIS — M2142 Flat foot [pes planus] (acquired), left foot: Secondary | ICD-10-CM | POA: Diagnosis not present

## 2018-06-02 DIAGNOSIS — M25561 Pain in right knee: Secondary | ICD-10-CM | POA: Diagnosis not present

## 2018-06-02 DIAGNOSIS — M2142 Flat foot [pes planus] (acquired), left foot: Secondary | ICD-10-CM | POA: Diagnosis not present

## 2018-06-02 DIAGNOSIS — M9906 Segmental and somatic dysfunction of lower extremity: Secondary | ICD-10-CM | POA: Diagnosis not present

## 2018-06-02 DIAGNOSIS — M25562 Pain in left knee: Secondary | ICD-10-CM | POA: Diagnosis not present

## 2018-06-08 ENCOUNTER — Ambulatory Visit: Payer: 59 | Admitting: Orthotics

## 2018-06-08 DIAGNOSIS — M722 Plantar fascial fibromatosis: Secondary | ICD-10-CM

## 2018-06-08 DIAGNOSIS — M2141 Flat foot [pes planus] (acquired), right foot: Secondary | ICD-10-CM

## 2018-06-08 DIAGNOSIS — M2142 Flat foot [pes planus] (acquired), left foot: Secondary | ICD-10-CM

## 2018-06-08 NOTE — Progress Notes (Signed)
Patient came in today to pick up custom made foot orthotics.  The goals were accomplished and the patient reported no dissatisfaction with said orthotics.  Patient was advised of breakin period and how to report any issues. 

## 2018-06-09 ENCOUNTER — Ambulatory Visit: Payer: Self-pay | Admitting: General Surgery

## 2018-06-24 NOTE — Patient Instructions (Addendum)
Lisa KellyClarissa J Everett  06/24/2018   Your procedure is scheduled on: 07-02-18  Report to [facility] Main  Entrance  Report to admitting at        0530 AM    Call this number if you have problems the morning of surgery [phone]   Remember: Do not eat food:After Midnight.   NO SOLID FOOD AFTER MIDNIGHT THE NIGHT PRIOR TO SURGERY. NOTHING BY MOUTH EXCEPT CLEAR LIQUIDS UNTIL 3 HOURS PRIOR TO SCHEULED SURGERY. PLEASE FINISH ENSURE DRINK PER SURGEON ORDER 3 HOURS PRIOR TO SCHEDULED SURGERY TIME WHICH NEEDS TO BE COMPLETED AT ___0430 am_________.   Take these medicines the morning of surgery with A SIP OF WATER: none                                You may not have any metal on your body including hair pins and              piercings  Do not wear jewelry, make-up, lotions, powders or perfumes, deodorant             Do not wear nail polish.  Do not shave  48 hours prior to surgery.     Do not bring valuables to the hospital. [facility] IS NOT             RESPONSIBLE   FOR VALUABLES.  Contacts, dentures or bridgework may not be worn into surgery.  Leave suitcase in the car. After surgery it may be brought to your room.                  Please read over the following fact sheets you were given: _____________________________________________________________________          [facility] - Preparing for Surgery Before surgery, you can play an important role.  Because skin is not sterile, your skin needs to be as free of germs as possible.  You can reduce the number of germs on your skin by washing with CHG (chlorahexidine gluconate) soap before surgery.  CHG is an antiseptic cleaner which kills germs and bonds with the skin to continue killing germs even after washing. Please DO NOT use if you have an allergy to CHG or antibacterial soaps.  If your skin becomes reddened/irritated stop using the CHG and inform your nurse when you arrive at Short Stay. Do not shave (including  legs and underarms) for at least 48 hours prior to the first CHG shower.  You may shave your face/neck. Please follow these instructions carefully:  1.  Shower with CHG Soap the night before surgery and the  morning of Surgery.  2.  If you choose to wash your hair, wash your hair first as usual with your  normal  shampoo.  3.  After you shampoo, rinse your hair and body thoroughly to remove the  shampoo.                           4.  Use CHG as you would any other liquid soap.  You can apply chg directly  to the skin and wash                       Gently with a scrungie or clean washcloth.  5.  Apply  the CHG Soap to your body ONLY FROM THE NECK DOWN.   Do not use on face/ open                           Wound or open sores. Avoid contact with eyes, ears mouth and genitals (private parts).                       Wash face,  Genitals (private parts) with your normal soap.             6.  Wash thoroughly, paying special attention to the area where your surgery  will be performed.  7.  Thoroughly rinse your body with warm water from the neck down.  8.  DO NOT shower/wash with your normal soap after using and rinsing off  the CHG Soap.                9.  Pat yourself dry with a clean towel.            10.  Wear clean pajamas.            11.  Place clean sheets on your bed the night of your first shower and do not  sleep with pets. Day of Surgery : Do not apply any lotions/deodorants the morning of surgery.  Please wear clean clothes to the hospital/surgery center.  FAILURE TO FOLLOW THESE INSTRUCTIONS MAY RESULT IN THE CANCELLATION OF YOUR SURGERY PATIENT SIGNATURE_________________________________  NURSE SIGNATURE__________________________________  ________________________________________________________________________

## 2018-06-30 ENCOUNTER — Encounter (HOSPITAL_COMMUNITY): Payer: Self-pay

## 2018-06-30 ENCOUNTER — Encounter (HOSPITAL_COMMUNITY)
Admission: RE | Admit: 2018-06-30 | Discharge: 2018-06-30 | Disposition: A | Discharge status: Home / Self Care | Payer: 59 | Source: Ambulatory Visit | Attending: General Surgery | Admitting: General Surgery

## 2018-06-30 ENCOUNTER — Other Ambulatory Visit: Payer: Self-pay

## 2018-06-30 DIAGNOSIS — K432 Incisional hernia without obstruction or gangrene: Secondary | ICD-10-CM | POA: Diagnosis not present

## 2018-06-30 DIAGNOSIS — L7632 Postprocedural hematoma of skin and subcutaneous tissue following other procedure: Secondary | ICD-10-CM | POA: Diagnosis not present

## 2018-06-30 DIAGNOSIS — J9811 Atelectasis: Secondary | ICD-10-CM | POA: Diagnosis not present

## 2018-06-30 HISTORY — DX: Vitamin D deficiency, unspecified: E55.9

## 2018-06-30 HISTORY — DX: Flushing: R23.2

## 2018-06-30 HISTORY — DX: Allergy, unspecified, initial encounter: T78.40XA

## 2018-06-30 HISTORY — DX: Hypokalemia: E87.6

## 2018-06-30 HISTORY — DX: Family history of other specified conditions: Z84.89

## 2018-06-30 LAB — BASIC METABOLIC PANEL
ANION GAP: 8 (ref 5–15)
BUN: 15 mg/dL (ref 6–20)
CO2: 30 mmol/L (ref 22–32)
Calcium: 8.9 mg/dL (ref 8.9–10.3)
Chloride: 102 mmol/L (ref 98–111)
Creatinine, Ser: 1.02 mg/dL — ABNORMAL HIGH (ref 0.44–1.00)
GFR calc Af Amer: >60 mL/min (ref >60)
GFR calc non Af Amer: >60 mL/min (ref >60)
Glucose, Bld: 79 mg/dL (ref 70–99)
POTASSIUM: 3.7 mmol/L (ref 3.5–5.1)
SODIUM: 140 mmol/L (ref 135–145)

## 2018-06-30 LAB — CBC WITH DIFFERENTIAL/PLATELET
BASOS ABS: 0 10*3/uL (ref 0.0–0.1)
Basophils Relative: 1 %
EOS PCT: 4 %
Eosinophils Absolute: 0.2 10*3/uL (ref 0.0–0.7)
HCT: 41.4 % (ref 36.0–46.0)
Hemoglobin: 14 g/dL (ref 12.0–15.0)
LYMPHS PCT: 30 %
Lymphs Abs: 1.2 10*3/uL (ref 0.7–4.0)
MCH: 30.2 pg (ref 26.0–34.0)
MCHC: 33.8 g/dL (ref 30.0–36.0)
MCV: 89.2 fL (ref 78.0–100.0)
Monocytes Absolute: 0.3 10*3/uL (ref 0.1–1.0)
Monocytes Relative: 8 %
NEUTROS ABS: 2.3 10*3/uL (ref 1.7–7.7)
Neutrophils Relative %: 57 %
PLATELETS: 205 10*3/uL (ref 150–400)
RBC: 4.64 MIL/uL (ref 3.87–5.11)
RDW: 13.6 % (ref 11.5–15.5)
WBC: 3.9 10*3/uL — AB (ref 4.0–10.5)

## 2018-06-30 LAB — HCG, SERUM, QUALITATIVE: Preg, Serum: NEGATIVE

## 2018-07-02 ENCOUNTER — Inpatient Hospital Stay (HOSPITAL_COMMUNITY): Payer: 59 | Admitting: Anesthesiology

## 2018-07-02 ENCOUNTER — Encounter (HOSPITAL_COMMUNITY): Payer: Self-pay | Admitting: *Deleted

## 2018-07-02 ENCOUNTER — Inpatient Hospital Stay (HOSPITAL_COMMUNITY)
Admission: RE | Admit: 2018-07-02 | Discharge: 2018-07-08 | DRG: 354 | Disposition: A | Discharge status: Home / Self Care | Payer: 59 | Attending: General Surgery | Admitting: General Surgery

## 2018-07-02 ENCOUNTER — Encounter (HOSPITAL_COMMUNITY)
Admission: RE | Disposition: A | Discharge status: Home / Self Care | Payer: Self-pay | Source: Home / Self Care | Attending: General Surgery

## 2018-07-02 ENCOUNTER — Other Ambulatory Visit: Payer: Self-pay

## 2018-07-02 DIAGNOSIS — I10 Essential (primary) hypertension: Secondary | ICD-10-CM | POA: Diagnosis present

## 2018-07-02 DIAGNOSIS — J45909 Unspecified asthma, uncomplicated: Secondary | ICD-10-CM | POA: Diagnosis not present

## 2018-07-02 DIAGNOSIS — L7632 Postprocedural hematoma of skin and subcutaneous tissue following other procedure: Secondary | ICD-10-CM | POA: Diagnosis not present

## 2018-07-02 DIAGNOSIS — Z791 Long term (current) use of non-steroidal anti-inflammatories (NSAID): Secondary | ICD-10-CM

## 2018-07-02 DIAGNOSIS — R0600 Dyspnea, unspecified: Secondary | ICD-10-CM

## 2018-07-02 DIAGNOSIS — J9811 Atelectasis: Secondary | ICD-10-CM | POA: Diagnosis not present

## 2018-07-02 DIAGNOSIS — R0902 Hypoxemia: Secondary | ICD-10-CM

## 2018-07-02 DIAGNOSIS — E559 Vitamin D deficiency, unspecified: Secondary | ICD-10-CM | POA: Diagnosis present

## 2018-07-02 DIAGNOSIS — Z79899 Other long term (current) drug therapy: Secondary | ICD-10-CM | POA: Diagnosis not present

## 2018-07-02 DIAGNOSIS — K91840 Postprocedural hemorrhage and hematoma of a digestive system organ or structure following a digestive system procedure: Secondary | ICD-10-CM | POA: Diagnosis not present

## 2018-07-02 DIAGNOSIS — Z7982 Long term (current) use of aspirin: Secondary | ICD-10-CM

## 2018-07-02 DIAGNOSIS — Y848 Other medical procedures as the cause of abnormal reaction of the patient, or of later complication, without mention of misadventure at the time of the procedure: Secondary | ICD-10-CM | POA: Diagnosis not present

## 2018-07-02 DIAGNOSIS — K5900 Constipation, unspecified: Secondary | ICD-10-CM | POA: Diagnosis not present

## 2018-07-02 DIAGNOSIS — L7622 Postprocedural hemorrhage and hematoma of skin and subcutaneous tissue following other procedure: Secondary | ICD-10-CM | POA: Diagnosis not present

## 2018-07-02 DIAGNOSIS — K432 Incisional hernia without obstruction or gangrene: Principal | ICD-10-CM | POA: Diagnosis present

## 2018-07-02 DIAGNOSIS — I959 Hypotension, unspecified: Secondary | ICD-10-CM | POA: Diagnosis not present

## 2018-07-02 HISTORY — PX: INCISIONAL HERNIA REPAIR: SHX193

## 2018-07-02 HISTORY — PX: INSERTION OF MESH: SHX5868

## 2018-07-02 LAB — CBC
HCT: 40.8 % (ref 36.0–46.0)
Hemoglobin: 13.7 g/dL (ref 12.0–15.0)
MCH: 30 pg (ref 26.0–34.0)
MCHC: 33.6 g/dL (ref 30.0–36.0)
MCV: 89.5 fL (ref 78.0–100.0)
PLATELETS: 193 10*3/uL (ref 150–400)
RBC: 4.56 MIL/uL (ref 3.87–5.11)
RDW: 13.7 % (ref 11.5–15.5)
WBC: 9.5 10*3/uL (ref 4.0–10.5)

## 2018-07-02 LAB — CREATININE, SERUM
CREATININE: 1.03 mg/dL — AB (ref 0.44–1.00)
GFR calc Af Amer: >60 mL/min (ref >60)
GFR calc non Af Amer: >60 mL/min (ref >60)

## 2018-07-02 SURGERY — REPAIR, HERNIA, INCISIONAL
Anesthesia: General | Site: Abdomen

## 2018-07-02 MED ORDER — PROPOFOL 10 MG/ML IV BOLUS
INTRAVENOUS | Status: DC | PRN
  Administered 2018-07-02: 160 mg via INTRAVENOUS

## 2018-07-02 MED ORDER — KETOROLAC TROMETHAMINE 30 MG/ML IJ SOLN
30.0000 mg | Freq: Four times a day (QID) | INTRAMUSCULAR | Status: AC
  Administered 2018-07-02 – 2018-07-06 (×12): 30 mg via INTRAVENOUS
  Filled 2018-07-02 (×13): qty 1

## 2018-07-02 MED ORDER — POLYETHYLENE GLYCOL 3350 17 G PO PACK: 17.0000 g | PACK | Freq: Every day | ORAL | Status: DC | PRN

## 2018-07-02 MED ORDER — KETAMINE HCL 10 MG/ML IJ SOLN
INTRAMUSCULAR | Status: DC | PRN
  Administered 2018-07-02: 20 mg via INTRAVENOUS
  Administered 2018-07-02: 10 mg via INTRAVENOUS

## 2018-07-02 MED ORDER — LIDOCAINE 20MG/ML (2%) 15 ML SYRINGE OPTIME
INTRAMUSCULAR | Status: DC | PRN
  Administered 2018-07-02: 1.5 mg/kg/h via INTRAVENOUS

## 2018-07-02 MED ORDER — CHLORHEXIDINE GLUCONATE 4 % EX LIQD: 60.0000 mL | Freq: Once | CUTANEOUS | Status: DC

## 2018-07-02 MED ORDER — BUPIVACAINE HCL (PF) 0.5 % IJ SOLN
INTRAMUSCULAR | Status: AC
  Filled 2018-07-02: qty 30

## 2018-07-02 MED ORDER — CELECOXIB 200 MG PO CAPS
400.0000 mg | ORAL_CAPSULE | ORAL | Status: AC
  Administered 2018-07-02: 400 mg via ORAL
  Filled 2018-07-02: qty 2

## 2018-07-02 MED ORDER — DIPHENHYDRAMINE HCL 25 MG PO CAPS
25.0000 mg | ORAL_CAPSULE | Freq: Four times a day (QID) | ORAL | Status: DC | PRN
  Administered 2018-07-05 – 2018-07-06 (×2): 25 mg via ORAL
  Filled 2018-07-02 (×2): qty 1

## 2018-07-02 MED ORDER — ONDANSETRON HCL 4 MG/2ML IJ SOLN
4.0000 mg | Freq: Four times a day (QID) | INTRAMUSCULAR | Status: DC | PRN
  Administered 2018-07-02: 4 mg via INTRAVENOUS
  Filled 2018-07-02: qty 2

## 2018-07-02 MED ORDER — LIDOCAINE 2% (20 MG/ML) 5 ML SYRINGE
INTRAMUSCULAR | Status: DC | PRN
  Administered 2018-07-02: 50 mg via INTRAVENOUS

## 2018-07-02 MED ORDER — ROCURONIUM BROMIDE 10 MG/ML (PF) SYRINGE
PREFILLED_SYRINGE | INTRAVENOUS | Status: AC
  Filled 2018-07-02: qty 10

## 2018-07-02 MED ORDER — KETAMINE HCL 10 MG/ML IJ SOLN
INTRAMUSCULAR | Status: AC
  Filled 2018-07-02: qty 1

## 2018-07-02 MED ORDER — MIDAZOLAM HCL 5 MG/5ML IJ SOLN
INTRAMUSCULAR | Status: DC | PRN
  Administered 2018-07-02: 2 mg via INTRAVENOUS

## 2018-07-02 MED ORDER — ONDANSETRON HCL 4 MG/2ML IJ SOLN: 4.0000 mg | Freq: Once | INTRAMUSCULAR | Status: DC | PRN

## 2018-07-02 MED ORDER — HEPARIN SODIUM (PORCINE) 5000 UNIT/ML IJ SOLN
5000.0000 [IU] | Freq: Once | INTRAMUSCULAR | Status: AC
  Administered 2018-07-02: 5000 [IU] via SUBCUTANEOUS
  Filled 2018-07-02: qty 1

## 2018-07-02 MED ORDER — SUGAMMADEX SODIUM 200 MG/2ML IV SOLN
INTRAVENOUS | Status: DC | PRN
  Administered 2018-07-02: 200 mg via INTRAVENOUS

## 2018-07-02 MED ORDER — DEXAMETHASONE SODIUM PHOSPHATE 10 MG/ML IJ SOLN
INTRAMUSCULAR | Status: AC
  Filled 2018-07-02: qty 1

## 2018-07-02 MED ORDER — POTASSIUM CHLORIDE CRYS ER 10 MEQ PO TBCR
10.0000 meq | EXTENDED_RELEASE_TABLET | Freq: Every day | ORAL | Status: DC
  Administered 2018-07-02 – 2018-07-08 (×6): 10 meq via ORAL
  Filled 2018-07-02 (×6): qty 1

## 2018-07-02 MED ORDER — LORATADINE 10 MG PO TABS
10.0000 mg | ORAL_TABLET | Freq: Every day | ORAL | Status: DC
  Administered 2018-07-02 – 2018-07-08 (×6): 10 mg via ORAL
  Filled 2018-07-02 (×6): qty 1

## 2018-07-02 MED ORDER — GABAPENTIN 300 MG PO CAPS
300.0000 mg | ORAL_CAPSULE | Freq: Two times a day (BID) | ORAL | Status: DC
  Administered 2018-07-02 – 2018-07-08 (×11): 300 mg via ORAL
  Filled 2018-07-02 (×11): qty 1

## 2018-07-02 MED ORDER — HYDROCODONE-ACETAMINOPHEN 5-325 MG PO TABS
1.0000 | ORAL_TABLET | ORAL | Status: DC | PRN
  Administered 2018-07-02: 2 via ORAL
  Administered 2018-07-03: 1 via ORAL
  Administered 2018-07-03 – 2018-07-08 (×17): 2 via ORAL
  Filled 2018-07-02 (×8): qty 2
  Filled 2018-07-02: qty 1
  Filled 2018-07-02: qty 2
  Filled 2018-07-02: qty 1
  Filled 2018-07-02 (×8): qty 2
  Filled 2018-07-02: qty 1

## 2018-07-02 MED ORDER — BUPIVACAINE HCL (PF) 0.25 % IJ SOLN
INTRAMUSCULAR | Status: AC
  Filled 2018-07-02: qty 30

## 2018-07-02 MED ORDER — SUGAMMADEX SODIUM 200 MG/2ML IV SOLN
INTRAVENOUS | Status: AC
  Filled 2018-07-02: qty 2

## 2018-07-02 MED ORDER — ONDANSETRON 4 MG PO TBDP
4.0000 mg | ORAL_TABLET | Freq: Four times a day (QID) | ORAL | Status: DC | PRN
  Filled 2018-07-02: qty 1

## 2018-07-02 MED ORDER — ONDANSETRON HCL 4 MG/2ML IJ SOLN
INTRAMUSCULAR | Status: DC | PRN
  Administered 2018-07-02: 4 mg via INTRAVENOUS

## 2018-07-02 MED ORDER — GLUCOSAMINE 500 MG PO CAPS: ORAL_CAPSULE | Freq: Every day | ORAL | Status: DC

## 2018-07-02 MED ORDER — FENTANYL CITRATE (PF) 100 MCG/2ML IJ SOLN: 25.0000 ug | INTRAMUSCULAR | Status: DC | PRN

## 2018-07-02 MED ORDER — PROPOFOL 10 MG/ML IV BOLUS
INTRAVENOUS | Status: AC
  Filled 2018-07-02: qty 20

## 2018-07-02 MED ORDER — DEXTROSE-NACL 5-0.45 % IV SOLN
INTRAVENOUS | Status: DC
  Administered 2018-07-02 – 2018-07-05 (×5): via INTRAVENOUS

## 2018-07-02 MED ORDER — FENTANYL CITRATE (PF) 100 MCG/2ML IJ SOLN
INTRAMUSCULAR | Status: DC | PRN
  Administered 2018-07-02: 50 ug via INTRAVENOUS
  Administered 2018-07-02: 100 ug via INTRAVENOUS
  Administered 2018-07-02 (×2): 50 ug via INTRAVENOUS

## 2018-07-02 MED ORDER — CEFAZOLIN SODIUM-DEXTROSE 2-4 GM/100ML-% IV SOLN
2.0000 g | INTRAVENOUS | Status: AC
  Administered 2018-07-02: 2 g via INTRAVENOUS
  Filled 2018-07-02: qty 100

## 2018-07-02 MED ORDER — LACTATED RINGERS IV SOLN
INTRAVENOUS | Status: DC
  Administered 2018-07-02: 06:00:00 via INTRAVENOUS

## 2018-07-02 MED ORDER — DIPHENHYDRAMINE HCL 50 MG/ML IJ SOLN: 25.0000 mg | Freq: Four times a day (QID) | INTRAMUSCULAR | Status: DC | PRN

## 2018-07-02 MED ORDER — MORPHINE SULFATE (PF) 2 MG/ML IV SOLN
2.0000 mg | INTRAVENOUS | Status: DC | PRN
  Administered 2018-07-02 (×2): 2 mg via INTRAVENOUS
  Filled 2018-07-02 (×2): qty 1

## 2018-07-02 MED ORDER — FENTANYL CITRATE (PF) 250 MCG/5ML IJ SOLN
INTRAMUSCULAR | Status: AC
  Filled 2018-07-02: qty 5

## 2018-07-02 MED ORDER — ENSURE PRE-SURGERY PO LIQD: 296.0000 mL | Freq: Once | ORAL | Status: DC

## 2018-07-02 MED ORDER — OXYCODONE HCL 5 MG PO TABS: 5.0000 mg | ORAL_TABLET | Freq: Once | ORAL | Status: DC | PRN

## 2018-07-02 MED ORDER — BUPIVACAINE LIPOSOME 1.3 % IJ SUSP
20.0000 mL | Freq: Once | INTRAMUSCULAR | Status: AC
  Administered 2018-07-02: 20 mL
  Filled 2018-07-02: qty 20

## 2018-07-02 MED ORDER — BUPIVACAINE HCL (PF) 0.25 % IJ SOLN
INTRAMUSCULAR | Status: DC | PRN
  Administered 2018-07-02: 30 mL

## 2018-07-02 MED ORDER — LISINOPRIL 20 MG PO TABS
20.0000 mg | ORAL_TABLET | Freq: Every day | ORAL | Status: DC
  Administered 2018-07-02 – 2018-07-08 (×5): 20 mg via ORAL
  Filled 2018-07-02 (×5): qty 1

## 2018-07-02 MED ORDER — OXYCODONE HCL 5 MG/5ML PO SOLN
5.0000 mg | Freq: Once | ORAL | Status: DC | PRN
  Filled 2018-07-02: qty 5

## 2018-07-02 MED ORDER — HYDROCHLOROTHIAZIDE 25 MG PO TABS
25.0000 mg | ORAL_TABLET | Freq: Every day | ORAL | Status: DC
  Administered 2018-07-02 – 2018-07-08 (×5): 25 mg via ORAL
  Filled 2018-07-02 (×5): qty 1

## 2018-07-02 MED ORDER — ENOXAPARIN SODIUM 40 MG/0.4ML ~~LOC~~ SOLN
40.0000 mg | SUBCUTANEOUS | Status: DC
  Administered 2018-07-04 – 2018-07-08 (×5): 40 mg via SUBCUTANEOUS
  Filled 2018-07-02 (×5): qty 0.4

## 2018-07-02 MED ORDER — ONDANSETRON HCL 4 MG/2ML IJ SOLN
INTRAMUSCULAR | Status: AC
  Filled 2018-07-02: qty 2

## 2018-07-02 MED ORDER — LIDOCAINE 2% (20 MG/ML) 5 ML SYRINGE
INTRAMUSCULAR | Status: AC
  Filled 2018-07-02: qty 5

## 2018-07-02 MED ORDER — LISINOPRIL-HYDROCHLOROTHIAZIDE 20-25 MG PO TABS: 1.0000 | ORAL_TABLET | Freq: Every day | ORAL | Status: DC

## 2018-07-02 MED ORDER — HYDRALAZINE HCL 20 MG/ML IJ SOLN: 10.0000 mg | INTRAMUSCULAR | Status: DC | PRN

## 2018-07-02 MED ORDER — MIDAZOLAM HCL 2 MG/2ML IJ SOLN
INTRAMUSCULAR | Status: AC
  Filled 2018-07-02: qty 2

## 2018-07-02 MED ORDER — TRAMADOL HCL 50 MG PO TABS
50.0000 mg | ORAL_TABLET | Freq: Four times a day (QID) | ORAL | Status: DC | PRN
  Administered 2018-07-05 – 2018-07-07 (×3): 50 mg via ORAL
  Filled 2018-07-02 (×3): qty 1

## 2018-07-02 MED ORDER — ROCURONIUM BROMIDE 10 MG/ML (PF) SYRINGE
PREFILLED_SYRINGE | INTRAVENOUS | Status: DC | PRN
  Administered 2018-07-02 (×2): 10 mg via INTRAVENOUS
  Administered 2018-07-02: 50 mg via INTRAVENOUS

## 2018-07-02 MED ORDER — FENTANYL CITRATE (PF) 100 MCG/2ML IJ SOLN
INTRAMUSCULAR | Status: AC
  Filled 2018-07-02: qty 2

## 2018-07-02 MED ORDER — ACETAMINOPHEN 500 MG PO TABS
1000.0000 mg | ORAL_TABLET | ORAL | Status: AC
  Administered 2018-07-02: 1000 mg via ORAL
  Filled 2018-07-02: qty 2

## 2018-07-02 MED ORDER — DEXAMETHASONE SODIUM PHOSPHATE 10 MG/ML IJ SOLN
INTRAMUSCULAR | Status: DC | PRN
  Administered 2018-07-02: 10 mg via INTRAVENOUS

## 2018-07-02 MED ORDER — GABAPENTIN 300 MG PO CAPS
300.0000 mg | ORAL_CAPSULE | ORAL | Status: AC
  Administered 2018-07-02: 300 mg via ORAL
  Filled 2018-07-02: qty 1

## 2018-07-02 MED ORDER — SIMETHICONE 80 MG PO CHEW: 40.0000 mg | CHEWABLE_TABLET | Freq: Four times a day (QID) | ORAL | Status: DC | PRN

## 2018-07-02 SURGICAL SUPPLY — 48 items
BENZOIN TINCTURE PRP APPL 2/3 (GAUZE/BANDAGES/DRESSINGS) ×2 IMPLANT
BLADE SURG 15 STRL LF DISP TIS (BLADE) ×1 IMPLANT
BLADE SURG 15 STRL SS (BLADE) ×1
CELLS DAT CNTRL 66122 CELL SVR (MISCELLANEOUS) IMPLANT
CHLORAPREP W/TINT 26ML (MISCELLANEOUS) ×2 IMPLANT
COVER SURGICAL LIGHT HANDLE (MISCELLANEOUS) ×2 IMPLANT
DECANTER SPIKE VIAL GLASS SM (MISCELLANEOUS) ×2 IMPLANT
DRAPE LAPAROSCOPIC ABDOMINAL (DRAPES) ×2 IMPLANT
DRESSING TELFA ISLAND 4X8 (GAUZE/BANDAGES/DRESSINGS) ×2 IMPLANT
DRSG TEGADERM 4X4.75 (GAUZE/BANDAGES/DRESSINGS) IMPLANT
ELECT PENCIL ROCKER SW 15FT (MISCELLANEOUS) ×2 IMPLANT
ELECT REM PT RETURN 15FT ADLT (MISCELLANEOUS) ×2 IMPLANT
EVACUATOR SILICONE 100CC (DRAIN) ×2 IMPLANT
GAUZE SPONGE 4X4 12PLY STRL (GAUZE/BANDAGES/DRESSINGS) IMPLANT
GLOVE BIOGEL PI IND STRL 7.0 (GLOVE) ×1 IMPLANT
GLOVE BIOGEL PI INDICATOR 7.0 (GLOVE) ×1
GLOVE SURG SS PI 7.0 STRL IVOR (GLOVE) ×2 IMPLANT
GOWN STRL REUS W/TWL LRG LVL3 (GOWN DISPOSABLE) ×2 IMPLANT
GOWN STRL REUS W/TWL XL LVL3 (GOWN DISPOSABLE) ×2 IMPLANT
KIT BASIN OR (CUSTOM PROCEDURE TRAY) ×2 IMPLANT
MESH HERNIA 6X6 BARD (Mesh General) ×1 IMPLANT
MESH HERNIA BARD 6X6 (Mesh General) ×1 IMPLANT
NEEDLE HYPO 22GX1.5 SAFETY (NEEDLE) ×2 IMPLANT
PACK BASIC VI WITH GOWN DISP (CUSTOM PROCEDURE TRAY) ×2 IMPLANT
RETRACTOR WND ALEXIS 25 LRG (MISCELLANEOUS) IMPLANT
RTRCTR WOUND ALEXIS 18CM MED (MISCELLANEOUS)
RTRCTR WOUND ALEXIS 25CM LRG (MISCELLANEOUS)
SPONGE DRAIN TRACH 4X4 STRL 2S (GAUZE/BANDAGES/DRESSINGS) ×2 IMPLANT
SPONGE LAP 18X18 RF (DISPOSABLE) ×8 IMPLANT
SPONGE LAP 4X18 RFD (DISPOSABLE) ×2 IMPLANT
STRIP CLOSURE SKIN 1/2X4 (GAUZE/BANDAGES/DRESSINGS) ×2 IMPLANT
SUT ETHILON 2 0 PS N (SUTURE) ×2 IMPLANT
SUT MNCRL AB 4-0 PS2 18 (SUTURE) ×2 IMPLANT
SUT NOVA 0 T19/GS 22DT (SUTURE) ×4 IMPLANT
SUT NOVA NAB GS-21 0 18 T12 DT (SUTURE) ×2 IMPLANT
SUT PDS AB 0 CT1 36 (SUTURE) ×4 IMPLANT
SUT PROLENE 2 0 CT2 30 (SUTURE) ×4 IMPLANT
SUT SILK 2 0 SH CR/8 (SUTURE) IMPLANT
SUT VIC AB 2-0 CT1 27 (SUTURE) ×1
SUT VIC AB 2-0 CT1 TAPERPNT 27 (SUTURE) ×1 IMPLANT
SUT VIC AB 3-0 SH 27 (SUTURE) ×1
SUT VIC AB 3-0 SH 27XBRD (SUTURE) ×1 IMPLANT
SYR BULB IRRIGATION 50ML (SYRINGE) ×2 IMPLANT
SYR CONTROL 10ML LL (SYRINGE) ×2 IMPLANT
TOWEL OR 17X26 10 PK STRL BLUE (TOWEL DISPOSABLE) ×2 IMPLANT
TOWEL OR NON WOVEN STRL DISP B (DISPOSABLE) ×2 IMPLANT
TRAY FOLEY CATH 14FR (SET/KITS/TRAYS/PACK) ×2 IMPLANT
YANKAUER SUCT BULB TIP 10FT TU (MISCELLANEOUS) ×2 IMPLANT

## 2018-07-02 NOTE — H&P (Signed)
Lisa Everett is an 52 y.o. female.   Chief Complaint: abdominal pain HPI: 52 yo female with large abdominal ventral hernia. She had a laparoscopic repair followed by an infraumbilical incision based repair. The hernia gives pain multiple times a week.  Past Medical History:  Diagnosis Date  . Allergy    pet dander  . Asthma   . Family history of adverse reaction to anesthesia    Sister has [place] and vomiting  . Hot flashes   . Hypertension   . Hypokalemia   . Vitamin D deficiency     Past Surgical History:  Procedure Laterality Date  . CESAREAN SECTION    . HERNIA REPAIR     x3 3rd with Dr. Sheliah Hatch 07-02-18  . WISDOM TOOTH EXTRACTION      Family History  Problem Relation Age of Onset  . Diabetes Mother   . Hypertension Mother   . Hypertension Father   . Osteoarthritis Sister   . Diabetes Paternal Grandmother   . Diabetes Paternal Aunt    Social History:  reports that she has never smoked. She has never used smokeless tobacco. She reports that she does not drink alcohol or use drugs.  Allergies: No Known Allergies  Facility-Administered Medications Prior to Admission  Medication Dose Route Frequency Provider Last Rate Last Dose  . triamcinolone acetonide (KENALOG-40) injection 20 mg  20 mg Other Once Asencion Islam, DPM       Medications Prior to Admission  Medication Sig Dispense Refill  . aspirin 81 MG tablet Take 81 mg by mouth daily.    Marland Kitchen BLACK COHOSH EXTRACT PO Take 1 capsule by mouth daily.     . cetirizine (ZYRTEC) 10 MG tablet Take 10 mg by mouth at bedtime.     . Glucosamine HCl (GLUCOSAMINE PO) Take 1 tablet by mouth daily.    Marland Kitchen lisinopril-hydrochlorothiazide (PRINZIDE,ZESTORETIC) 20-25 MG tablet Take 1 tablet by mouth daily.    . meloxicam (MOBIC) 15 MG tablet TAKE 1 TABLET BY MOUTH  DAILY (Patient taking differently: Take 15 mg by mouth daily as needed for pain. ) 30 tablet 0  . potassium chloride (MICRO-K) 10 MEQ CR capsule Take 20 mEq by mouth  daily.     . SOY ISOFLAVONE PO Take 1 capsule by mouth daily.    . Turmeric Curcumin 500 MG CAPS Take 500 mg by mouth daily.    . vitamin B-12 (CYANOCOBALAMIN) 500 MCG tablet Take 500 mcg by mouth daily.     . Vitamin D, Ergocalciferol, (DRISDOL) 50000 units CAPS capsule Take 50,000 Units by mouth once a week.    . fluticasone (FLONASE) 50 MCG/ACT nasal spray Place 1-2 sprays into both nostrils daily for 7 days. (Patient not taking: Reported on 06/23/2018) 1 g 0    Results for orders placed or performed during the hospital encounter of 07/02/18 (from the past 48 hour(s))  hCG, serum, qualitative (Not at [facility])     Status: None   Collection Time: 06/30/18  8:01 AM  Result Value Ref Range   Preg, Serum NEGATIVE NEGATIVE    Comment:        THE SENSITIVITY OF THIS METHODOLOGY IS >10 mIU/mL. Performed at [facility], [address], [place], [place] [address]    No results found.  Review of Systems  Constitutional: Negative for chills and fever.  HENT: Negative for hearing loss.   Eyes: Negative for blurred vision and double vision.  Respiratory: Negative for cough and hemoptysis.  Cardiovascular: Negative for chest pain and palpitations.  Gastrointestinal: Positive for abdominal pain. Negative for nausea and vomiting.  Genitourinary: Negative for dysuria and urgency.  Musculoskeletal: Negative for myalgias and neck pain.  Skin: Negative for itching and rash.  Neurological: Negative for dizziness, tingling and headaches.  Endo/Heme/Allergies: Does not bruise/bleed easily.  Psychiatric/Behavioral: Negative for depression and suicidal ideas.    Blood pressure (!) 123/92, pulse 80, temperature 98.6 F (37 C), temperature source Oral, resp. rate 16, height 5\' 1"  (1.549 m), weight 94.3 kg, last menstrual period 06/30/2018, SpO2 97 %. Physical Exam  Vitals reviewed. Constitutional: She is oriented to person, place, and time. She appears well-developed and  well-nourished.  HENT:  Head: Normocephalic and atraumatic.  Eyes: Pupils are equal, round, and reactive to light. Conjunctivae and EOM are normal.  Neck: Normal range of motion. Neck supple.  Cardiovascular: Normal rate and regular rhythm.  Respiratory: Effort normal and breath sounds normal.  GI: Soft. Bowel sounds are normal. She exhibits no distension. There is no tenderness.  Large abdominal hernia  Musculoskeletal: Normal range of motion.  Neurological: She is alert and oriented to person, place, and time.  Skin: Skin is warm and dry.  Psychiatric: She has a normal mood and affect. Her behavior is normal.     Assessment/Plan 52 yo female with recurrent incisional hernia -open incisional hernia repair via retrorectus technique -inpatient admission  Rodman Pickle, MD 07/02/2018, 7:26 AM

## 2018-07-02 NOTE — Anesthesia Preprocedure Evaluation (Signed)
Anesthesia Evaluation  Patient identified by MRN, date of birth, ID band Patient awake    Reviewed: Allergy & Precautions, NPO status , Patient's Chart, lab work & pertinent test results  History of Anesthesia Complications Negative for: history of anesthetic complications  Airway Mallampati: I  TM Distance: >3 FB Neck ROM: Full    Dental no notable dental hx.    Pulmonary asthma ,    Pulmonary exam normal        Cardiovascular hypertension, Normal cardiovascular exam     Neuro/Psych negative neurological ROS  negative psych ROS   GI/Hepatic Neg liver ROS,   Endo/Other  negative endocrine ROS  Renal/GU negative Renal ROS  negative genitourinary   Musculoskeletal negative musculoskeletal ROS (+)   Abdominal   Peds  Hematology negative hematology ROS (+)   Anesthesia Other Findings   Reproductive/Obstetrics negative OB ROS                             Anesthesia Physical Anesthesia Plan  ASA: II  Anesthesia Plan: General   Post-op Pain Management:    Induction:   PONV Risk Score and Plan: 4 or greater and Ondansetron, Dexamethasone, Midazolam and Treatment may vary due to age or medical condition  Airway Management Planned: Oral ETT  Additional Equipment:   Intra-op Plan:   Post-operative Plan: Extubation in OR  Informed Consent: I have reviewed the patients History and Physical, chart, labs and discussed the procedure including the risks, benefits and alternatives for the proposed anesthesia with the patient or authorized representative who has indicated his/her understanding and acceptance.     Plan Discussed with: CRNA, Anesthesiologist and Surgeon  Anesthesia Plan Comments:         Anesthesia Quick Evaluation

## 2018-07-02 NOTE — Anesthesia Procedure Notes (Signed)
Procedure Name: Intubation Date/Time: 07/02/2018 7:38 AM Performed by: Anne Fu, CRNA Pre-anesthesia Checklist: Patient identified, Emergency Drugs available, Suction available, Patient being monitored and Timeout performed Patient Re-evaluated:Patient Re-evaluated prior to induction Oxygen Delivery Method: Circle system utilized Preoxygenation: Pre-oxygenation with 100% oxygen Induction Type: IV induction Ventilation: Mask ventilation without difficulty Laryngoscope Size: Mac and 4 Grade View: Grade I Tube type: Oral Tube size: 7.5 mm Number of attempts: 1 Airway Equipment and Method: Stylet Placement Confirmation: ETT inserted through vocal cords under direct vision,  positive ETCO2 and breath sounds checked- equal and bilateral Secured at: 21 cm Tube secured with: Tape Dental Injury: Teeth and Oropharynx as per pre-operative assessment

## 2018-07-02 NOTE — Op Note (Signed)
Preoperative diagnosis: recurrent incisional hernia  Postoperative diagnosis: same   Procedure: open recurrent incisional hernia repair with mesh by retrorectus technique  Surgeon: Feliciana Rossetti, M.D.  Asst: Estelle Grumbles  Anesthesia: general  Indications for procedure: Lisa Everett is a 52 y.o. year old female with symptoms of abdominal pain and enlarging abdominal bulge.  Description of procedure: The patient was brought into the operative suite. Anesthesia was administered with General endotracheal anesthesia. WHO checklist was applied. The patient was then placed in supine position. The area was prepped and draped in the usual sterile fashion.  Next a vertical incision was made directly over the palpable hernia.  Cautery was used to dissect the tissues using the odor.  Blunt dissection was used to dissect through the subtenons tissue away from the hernia sac the hernia sac was easily entered.  The hernia sac contained colon and omentum.  There were 2 attachments of the fascia to the visceral contents.  These were lysed and the visceral contents were reduced.  Defect was about 6 cm in size.  Patient was made to proceed with retrorectus repair.  Palpation of the peritoneum showed a intact small umbilical mesh that was inferior to the hernia recurrence.  This time the Exparel Marcaine mix was injected into the lateral rectus subfascial areas.  A vertical incision was made in the anterior rectus sheath.  This was continued superiorly and inferiorly and blunt dissection was used to separate the rectus muscle away from the posterior sheath.  This was continued on the left and right sides.  Hemostasis was applied with cautery.  Next the posterior rectus sheath was closed with 0 PDS in running fashion.  A 15 x 15 cm Bard mesh was inserted in the retrorectus space and sutured to the posterior sheath with 0 Novafil in 4 corners.  Next, the mesh was sutured to the anterior sheath with 4 additional  refills 2 on each left and right side.  Next, a 60 [place] Blake drain was introduced via a right lower quadrant stab incision such that the drain was placed into the simultaneous area and into the retrorectus area and sutured in place with a 2-0 nylon.  Wound was irrigated with saline.  Next the anterior rectus sheath was closed with a running 0 PDS.  The skin was removed with cautery.  3-0 Vicryl interrupted suture was used to close the deep subcutaneous areas to limit dead space.  And a running 4-0 Monocryl subcuticular stitch was used to close skin.  Steri-Strips and dressing were put in place.  Patient awoke from anesthesia brought to PACU in stable condition.  All counts are correct.  Findings: 6 x 4 cm supraumbilical hernia containing colon and omentum  Specimen: None  Implant: 15 x 15 cm Bard mesh in the retrorectus space  Blood loss:50 Millimeters  Local anesthesia: 50 ml Exparel:Marcaine Mix  Complications: none  Feliciana Rossetti, M.D. General, Bariatric, & Minimally Invasive Surgery [facility]

## 2018-07-02 NOTE — Anesthesia Postprocedure Evaluation (Signed)
Anesthesia Post Note  Patient: AROOSH VESTAL  Procedure(s) Performed: HERNIA REPAIR INCISIONAL OPEN RECURRENT (N/A Abdomen) INSERTION OF MESH (N/A Abdomen)     Patient location during evaluation: PACU Anesthesia Type: General Level of consciousness: awake and alert Pain management: pain level controlled Vital Signs Assessment: post-procedure vital signs reviewed and stable Respiratory status: spontaneous breathing, nonlabored ventilation, respiratory function stable and patient connected to nasal cannula oxygen Cardiovascular status: blood pressure returned to baseline and stable Postop Assessment: no apparent nausea or vomiting Anesthetic complications: no    Last Vitals:  Vitals:   07/02/18 1145 07/02/18 1200  BP: (!) 133/94   Pulse: 70   Resp: 12   Temp:  36.7 C  SpO2: 100%     Last Pain:  Vitals:   07/02/18 1200  TempSrc:   PainSc: Asleep                 Lucretia Kern

## 2018-07-02 NOTE — Transfer of Care (Signed)
Immediate Anesthesia Transfer of Care Note  Patient: Lisa Everett  Procedure(s) Performed: Procedure(s): HERNIA REPAIR INCISIONAL OPEN RECURRENT (N/A) INSERTION OF MESH (N/A)  Patient Location: PACU  Anesthesia Type:General  Level of Consciousness:  sedated, patient cooperative and responds to stimulation  Airway & Oxygen Therapy:Patient Spontanous Breathing and Patient connected to face mask oxgen  Post-op Assessment:  Report given to PACU RN and Post -op Vital signs reviewed and stable  Post vital signs:  Reviewed and stable  Last Vitals:  Vitals:   07/02/18 0600  BP: (!) 123/92  Pulse: 80  Resp: 16  Temp: 37 C  SpO2: 97%    Complications: No apparent anesthesia complications

## 2018-07-03 ENCOUNTER — Encounter (HOSPITAL_COMMUNITY)
Admission: RE | Disposition: A | Discharge status: Home / Self Care | Payer: Self-pay | Source: Home / Self Care | Attending: General Surgery

## 2018-07-03 ENCOUNTER — Encounter (HOSPITAL_COMMUNITY): Payer: Self-pay | Admitting: Certified Registered"

## 2018-07-03 ENCOUNTER — Inpatient Hospital Stay (HOSPITAL_COMMUNITY): Payer: 59 | Admitting: Anesthesiology

## 2018-07-03 HISTORY — PX: LAPAROTOMY: SHX154

## 2018-07-03 LAB — CBC
HCT: 30.8 % — ABNORMAL LOW (ref 36.0–46.0)
Hemoglobin: 10.2 g/dL — ABNORMAL LOW (ref 12.0–15.0)
MCH: 30.1 pg (ref 26.0–34.0)
MCHC: 33.1 g/dL (ref 30.0–36.0)
MCV: 90.9 fL (ref 78.0–100.0)
Platelets: 209 10*3/uL (ref 150–400)
RBC: 3.39 MIL/uL — AB (ref 3.87–5.11)
RDW: 13.9 % (ref 11.5–15.5)
WBC: 8.4 10*3/uL (ref 4.0–10.5)

## 2018-07-03 LAB — BASIC METABOLIC PANEL
Anion gap: 8 (ref 5–15)
BUN: 16 mg/dL (ref 6–20)
CALCIUM: 7.2 mg/dL — AB (ref 8.9–10.3)
CHLORIDE: 105 mmol/L (ref 98–111)
CO2: 28 mmol/L (ref 22–32)
CREATININE: 1.25 mg/dL — AB (ref 0.44–1.00)
GFR calc Af Amer: 56 mL/min — ABNORMAL LOW (ref >60)
GFR, EST NON AFRICAN AMERICAN: 48 mL/min — AB (ref >60)
Glucose, Bld: 162 mg/dL — ABNORMAL HIGH (ref 70–99)
Potassium: 3.3 mmol/L — ABNORMAL LOW (ref 3.5–5.1)
SODIUM: 141 mmol/L (ref 135–145)

## 2018-07-03 LAB — ABO/RH: ABO/RH(D): O POS

## 2018-07-03 LAB — GLUCOSE, CAPILLARY: Glucose-Capillary: 143 mg/dL — ABNORMAL HIGH (ref 70–99)

## 2018-07-03 LAB — MRSA PCR SCREENING: MRSA BY PCR: NEGATIVE

## 2018-07-03 LAB — PREPARE RBC (CROSSMATCH)

## 2018-07-03 LAB — TROPONIN I: Troponin I: <0.03 ng/mL (ref <0.03)

## 2018-07-03 SURGERY — LAPAROTOMY, EXPLORATORY
Anesthesia: General | Site: Abdomen

## 2018-07-03 MED ORDER — SUGAMMADEX SODIUM 200 MG/2ML IV SOLN
INTRAVENOUS | Status: DC | PRN
  Administered 2018-07-03: 300 mg via INTRAVENOUS

## 2018-07-03 MED ORDER — PHENYLEPHRINE HCL-NACL 10-0.9 MG/250ML-% IV SOLN
INTRAVENOUS | Status: AC
  Filled 2018-07-03: qty 250

## 2018-07-03 MED ORDER — PROPOFOL 10 MG/ML IV BOLUS
INTRAVENOUS | Status: AC
  Filled 2018-07-03: qty 20

## 2018-07-03 MED ORDER — 0.9 % SODIUM CHLORIDE (POUR BTL) OPTIME
TOPICAL | Status: DC | PRN
  Administered 2018-07-03: 3000 mL

## 2018-07-03 MED ORDER — PROPOFOL 10 MG/ML IV BOLUS
INTRAVENOUS | Status: DC | PRN
  Administered 2018-07-03: 150 mg via INTRAVENOUS

## 2018-07-03 MED ORDER — FENTANYL CITRATE (PF) 100 MCG/2ML IJ SOLN: 25.0000 ug | INTRAMUSCULAR | Status: DC | PRN

## 2018-07-03 MED ORDER — FENTANYL CITRATE (PF) 250 MCG/5ML IJ SOLN
INTRAMUSCULAR | Status: AC
  Filled 2018-07-03: qty 5

## 2018-07-03 MED ORDER — SODIUM CHLORIDE 0.9% IV SOLUTION: Freq: Once | INTRAVENOUS | Status: DC

## 2018-07-03 MED ORDER — LACTATED RINGERS IV SOLN
INTRAVENOUS | Status: DC | PRN
  Administered 2018-07-03 (×2): via INTRAVENOUS

## 2018-07-03 MED ORDER — LACTATED RINGERS IV SOLN
INTRAVENOUS | Status: DC
  Administered 2018-07-03: 10:00:00 via INTRAVENOUS

## 2018-07-03 MED ORDER — CEFAZOLIN SODIUM-DEXTROSE 2-4 GM/100ML-% IV SOLN
INTRAVENOUS | Status: AC
  Filled 2018-07-03: qty 100

## 2018-07-03 MED ORDER — SUCCINYLCHOLINE CHLORIDE 200 MG/10ML IV SOSY
PREFILLED_SYRINGE | INTRAVENOUS | Status: DC | PRN
  Administered 2018-07-03: 100 mg via INTRAVENOUS

## 2018-07-03 MED ORDER — MEPERIDINE HCL 50 MG/ML IJ SOLN: 6.2500 mg | INTRAMUSCULAR | Status: DC | PRN

## 2018-07-03 MED ORDER — SODIUM CHLORIDE 0.9 % IV SOLN
INTRAVENOUS | Status: DC | PRN
  Administered 2018-07-03: 11:00:00 via INTRAVENOUS

## 2018-07-03 MED ORDER — MIDAZOLAM HCL 2 MG/2ML IJ SOLN
INTRAMUSCULAR | Status: DC | PRN
  Administered 2018-07-03: 1.5 mg via INTRAVENOUS
  Administered 2018-07-03: 0.5 mg via INTRAVENOUS

## 2018-07-03 MED ORDER — CEFAZOLIN SODIUM-DEXTROSE 2-3 GM-%(50ML) IV SOLR
INTRAVENOUS | Status: DC | PRN
  Administered 2018-07-03: 2 g via INTRAVENOUS

## 2018-07-03 MED ORDER — LACTATED RINGERS IV SOLN: INTRAVENOUS | Status: DC

## 2018-07-03 MED ORDER — ONDANSETRON HCL 4 MG/2ML IJ SOLN
INTRAMUSCULAR | Status: DC | PRN
  Administered 2018-07-03: 4 mg via INTRAVENOUS

## 2018-07-03 MED ORDER — MIDAZOLAM HCL 2 MG/2ML IJ SOLN
INTRAMUSCULAR | Status: AC
  Filled 2018-07-03: qty 2

## 2018-07-03 MED ORDER — FENTANYL CITRATE (PF) 250 MCG/5ML IJ SOLN
INTRAMUSCULAR | Status: DC | PRN
  Administered 2018-07-03: 75 ug via INTRAVENOUS
  Administered 2018-07-03: 25 ug via INTRAVENOUS

## 2018-07-03 MED ORDER — METOCLOPRAMIDE HCL 5 MG/ML IJ SOLN: 10.0000 mg | Freq: Once | INTRAMUSCULAR | Status: DC | PRN

## 2018-07-03 MED ORDER — SODIUM CHLORIDE 0.9 % IV BOLUS
1000.0000 mL | Freq: Once | INTRAVENOUS | Status: AC
  Administered 2018-07-03: 1000 mL via INTRAVENOUS

## 2018-07-03 MED ORDER — SODIUM CHLORIDE 0.9 % IV SOLN
INTRAVENOUS | Status: DC | PRN
  Administered 2018-07-03: 25 ug/min via INTRAVENOUS

## 2018-07-03 MED ORDER — LIDOCAINE 2% (20 MG/ML) 5 ML SYRINGE
INTRAMUSCULAR | Status: DC | PRN
  Administered 2018-07-03: 100 mg via INTRAVENOUS

## 2018-07-03 MED ORDER — ROCURONIUM BROMIDE 10 MG/ML (PF) SYRINGE
PREFILLED_SYRINGE | INTRAVENOUS | Status: DC | PRN
  Administered 2018-07-03: 40 mg via INTRAVENOUS

## 2018-07-03 SURGICAL SUPPLY — 42 items
BINDER ABDOMINAL 12 ML 46-62 (SOFTGOODS) ×2 IMPLANT
CHLORAPREP W/TINT 26ML (MISCELLANEOUS) ×2 IMPLANT
COVER MAYO STAND STRL (DRAPES) ×2 IMPLANT
COVER SURGICAL LIGHT HANDLE (MISCELLANEOUS) ×2 IMPLANT
DRAPE LAPAROSCOPIC ABDOMINAL (DRAPES) ×2 IMPLANT
DRAPE UTILITY XL STRL (DRAPES) ×2 IMPLANT
DRAPE WARM FLUID 44X44 (DRAPE) ×2 IMPLANT
DRSG OPSITE POSTOP 4X10 (GAUZE/BANDAGES/DRESSINGS) ×2 IMPLANT
DRSG OPSITE POSTOP 4X6 (GAUZE/BANDAGES/DRESSINGS) IMPLANT
DRSG OPSITE POSTOP 4X8 (GAUZE/BANDAGES/DRESSINGS) IMPLANT
ELECT REM PT RETURN 15FT ADLT (MISCELLANEOUS) ×2 IMPLANT
GLOVE BIOGEL PI IND STRL 7.0 (GLOVE) ×1 IMPLANT
GLOVE BIOGEL PI INDICATOR 7.0 (GLOVE) ×1
GLOVE SURG SS PI 7.0 STRL IVOR (GLOVE) ×2 IMPLANT
GOWN STRL REUS W/TWL LRG LVL3 (GOWN DISPOSABLE) ×2 IMPLANT
GOWN STRL REUS W/TWL XL LVL3 (GOWN DISPOSABLE) ×2 IMPLANT
HANDLE SUCTION POOLE (INSTRUMENTS) ×1 IMPLANT
KIT BASIN OR (CUSTOM PROCEDURE TRAY) ×2 IMPLANT
PACK GENERAL/GYN (CUSTOM PROCEDURE TRAY) ×2 IMPLANT
RELOAD PROXIMATE 100 BLUE (MISCELLANEOUS) IMPLANT
RELOAD PROXIMATE 100MM BLUE (MISCELLANEOUS)
SPONGE DRAIN TRACH 4X4 STRL 2S (GAUZE/BANDAGES/DRESSINGS) ×2 IMPLANT
SPONGE LAP 18X18 RF (DISPOSABLE) IMPLANT
STAPLER PROXIMATE 100MM BLUE (MISCELLANEOUS) IMPLANT
STAPLER VISISTAT 35W (STAPLE) ×2 IMPLANT
SUCTION POOLE HANDLE (INSTRUMENTS) ×2
SUT MNCRL AB 4-0 PS2 18 (SUTURE) ×2 IMPLANT
SUT PDS AB 0 CT1 36 (SUTURE) IMPLANT
SUT SILK 2 0 (SUTURE) ×1
SUT SILK 2 0 SH CR/8 (SUTURE) ×2 IMPLANT
SUT SILK 2-0 18XBRD TIE 12 (SUTURE) ×1 IMPLANT
SUT SILK 3 0 (SUTURE) ×1
SUT SILK 3 0 SH CR/8 (SUTURE) ×2 IMPLANT
SUT SILK 3-0 18XBRD TIE 12 (SUTURE) ×1 IMPLANT
SUT VIC AB 2-0 SH 18 (SUTURE) ×2 IMPLANT
SUT VIC AB 3-0 SH 18 (SUTURE) ×2 IMPLANT
TAPE CLOTH SURG 4X10 WHT LF (GAUZE/BANDAGES/DRESSINGS) ×2 IMPLANT
TOWEL OR 17X26 10 PK STRL BLUE (TOWEL DISPOSABLE) ×2 IMPLANT
TOWEL OR NON WOVEN STRL DISP B (DISPOSABLE) ×2 IMPLANT
TRAY FOLEY MTR SLVR 14FR STAT (SET/KITS/TRAYS/PACK) ×2 IMPLANT
TRAY FOLEY MTR SLVR 16FR STAT (SET/KITS/TRAYS/PACK) ×2 IMPLANT
YANKAUER SUCT BULB TIP NO VENT (SUCTIONS) ×2 IMPLANT

## 2018-07-03 NOTE — Anesthesia Postprocedure Evaluation (Signed)
Anesthesia Post Note  Patient: Lisa Everett  Procedure(s) Performed: EXPLORATORY LAPAROTOMY, RE-OPENING OF INCISION AND REMOVAL OF HEMATOMA (N/A Abdomen)     Patient location during evaluation: PACU Anesthesia Type: General Level of consciousness: awake and alert Pain management: pain level controlled Vital Signs Assessment: post-procedure vital signs reviewed and stable Respiratory status: spontaneous breathing, nonlabored ventilation, respiratory function stable and patient connected to nasal cannula oxygen Cardiovascular status: blood pressure returned to baseline and stable Postop Assessment: no apparent nausea or vomiting Anesthetic complications: no    Last Vitals:  Vitals:   07/03/18 1230 07/03/18 1245  BP:  98/73  Pulse:    Resp: 12   Temp:    SpO2:      Last Pain:  Vitals:   07/03/18 1245  TempSrc:   PainSc: 0-No pain                 Phillips Grout

## 2018-07-03 NOTE — Progress Notes (Signed)
Pt up & ambulated with NT and want to use bathroom before she get in bed. NT took pt to bathroom.pt was on commode ,voiding. Started c/o feeling dizzy. NT notified to the pt's primary nurse. Pt noted diaphoretic ,cold, calmly , very lethargic and pale. Pt was verbally responsive. Called Rapid Response & helped pt to get back in bed. V/S B/P-75/60,HR-89,R-15,T-98.3,O2 sats 100%with 4 lit of O2 N/C. NS bolus administered initially by RR Nurse. Called Dr Sheliah Hatch ,gave him updates about pt's condition . Received order to give 1 lit NS bolus & if not improving call MD back. After completed NS bolus pt's B/P still 80's and 90's . Pt's abdominal wound more distended than initially shift assessment . dark bloody drain empty from JP drain . RR Nurse called MD and received new order for several labs,EKG and transfer pt to stepdown unit. DR Kinsinger present at the bedside. Called pt's daughter Kalayah Mazzarese ,gave her updates. Transferred pt to stepdown Room #1231. Report given to [facility]. Pt was more alert.

## 2018-07-03 NOTE — Anesthesia Procedure Notes (Signed)
Date/Time: 07/03/2018 11:54 AM Performed by: Minerva Ends, CRNA Oxygen Delivery Method: Simple face mask Placement Confirmation: positive ETCO2 and breath sounds checked- equal and bilateral Dental Injury: Teeth and Oropharynx as per pre-operative assessment

## 2018-07-03 NOTE — Transfer of Care (Signed)
Immediate Anesthesia Transfer of Care Note  Patient: Lisa Everett  Procedure(s) Performed: EXPLORATORY LAPAROTOMY, RE-OPENING OF INCISION AND REMOVAL OF HEMATOMA (N/A Abdomen)  Patient Location: PACU  Anesthesia Type:General  Level of Consciousness: awake and alert   Airway & Oxygen Therapy: Patient Spontanous Breathing and Patient connected to face mask oxygen  Post-op Assessment: Report given to RN and Post -op Vital signs reviewed and stable  Post vital signs: Reviewed and stable  Last Vitals:  Vitals Value Taken Time  BP    Temp    Pulse    Resp    SpO2      Last Pain:  Vitals:   07/03/18 1015  TempSrc:   PainSc: 0-No pain      Patients Stated Pain Goal: 2 (07/02/18 2035)  Complications: No apparent anesthesia complications

## 2018-07-03 NOTE — Anesthesia Procedure Notes (Signed)
Procedure Name: Intubation Date/Time: 07/03/2018 10:50 AM Performed by: Minerva Ends, CRNA Pre-anesthesia Checklist: Patient identified, Emergency Drugs available, Suction available and Patient being monitored Patient Re-evaluated:Patient Re-evaluated prior to induction Oxygen Delivery Method: Circle System Utilized Preoxygenation: Pre-oxygenation with 100% oxygen Induction Type: IV induction, Rapid sequence and Cricoid Pressure applied Laryngoscope Size: Miller and 2 Grade View: Grade I Tube type: Oral Tube size: 7.0 mm Number of attempts: 1 Airway Equipment and Method: Stylet Placement Confirmation: ETT inserted through vocal cords under direct vision,  positive ETCO2 and breath sounds checked- equal and bilateral Secured at: 21 cm Tube secured with: Tape Dental Injury: Teeth and Oropharynx as per pre-operative assessment  Comments: Smooth IV induction Carignan- intubation AM CRNA atraumatic--- teeth and mouth as preop-- bilat BS

## 2018-07-03 NOTE — Progress Notes (Signed)
PRE OP NSG NOTE: Pt arrived via bed into pre op area, report received from RN. Pt placed on cont cardiac monitoring with int NBP and cont POX monitoring, IV sites assessed. MDA aware of pts arrival. MD aware of pts in pre op

## 2018-07-03 NOTE — Significant Event (Signed)
Rapid Response Event Note  Overview: Time Called: 0000 Arrival Time: 0005 Event Type: Hypotension  Initial Focused Assessment: Upon entering pt room, pt found to be diaphoretic but oriented and somewhat anxious.  VS see flow sheet.  Interventions:  Pt placed on monitor, SR.  Pt A/O.  Pt given fluid bolus per protocol, MD does not want to start Dopamine.  MD ordered more fluid, labs and EKG.  MD made round at the bedside after wanting pt transferred to the SD.  Plan of Care (if not transferred):   Event Summary: Name of Physician Notified: Kinsinger, De Blanch, MD at 0035(pt RN and I had multiple reports with MD)    at    Outcome: Transferred (Comment)  Event End Time: 0222  Conley Rolls

## 2018-07-03 NOTE — Op Note (Signed)
Preoperative diagnosis: postoperative bleed  Postoperative diagnosis: same   Procedure: removing of wound, removal of hematoma, complex closure of 15cm abdominal wound  Surgeon: Feliciana Rossetti, M.D.  Asst: none  Anesthesia: general  Indications for procedure: Lisa Everett is a 52 y.o. year old female with symptoms of hypotension in the early post operative period after hernia repair. Exam was concerning for bleeding in the subcutaneous area so patient was taken to the OR for reexploration.  Description of procedure: The patient was brought into the operative suite. Anesthesia was administered with General endotracheal anesthesia. WHO checklist was applied. The patient was then placed in supine position. The area was prepped and draped in the usual sterile fashion.  The monocryl was cut. The vicryls were removed. A large amount of blood and clots was encountered in the subcutaneous space. The clots were evacuated. The area was irrigated with warm saline. The subcutaneous area was inspected and multiple areas of slow ooze were identified and hemostasis was established with cautery. There did not appear to be any blood coming from the subfascial area as there was no liquid around the drain and bulge of the anterior fascia. Therefore, the anterior rectus closure was not disrupted. The area was re-inspected and hemostasis appeared intact.  In order to close down the dead space interrupted 2-0 vicryls were used to appose the subcutaneous tissue down to the fascia. Next, additional 2-0 vicryls were used to appose the subcutaneous tissue to itself in 2 layers of interrupted sutures. Next, interrupted 3-0 vicryl was used to appose the deep dermal layer. A 4-0 monocryl was used in running fashion to close the subcuticular layer. Dressing was put in place. The patient was transferred to PACU and had been on neo during the case.  Findings: large amount of hematoma in subcutaneous space  Specimen:  none  Implant: none   Blood loss:  Local anesthesia: none  Complications: none  Feliciana Rossetti, M.D. General, Bariatric, & Minimally Invasive Surgery [facility]

## 2018-07-03 NOTE — Progress Notes (Signed)
  Progress Note: General Surgery Service   Assessment/Plan: Active Problems:   Incisional hernia  s/p Procedure(s): HERNIA REPAIR INCISIONAL OPEN RECURRENT INSERTION OF MESH 07/02/2018 -persistent hypotension with high blake drain and distension of wound concerning for bleed -2 units of blood for acute blood loss and hypotension -return to OR for hematoma evacuation and evaluation for active bleed    LOS: 1 day  Chief Complaint/Subjective: Pain moderate, not dizzy currently  Objective: Vital signs in last 24 hours: Temp:  [97.5 F (36.4 C)-98.4 F (36.9 C)] 98.3 F (36.8 C) (09/07 0253) Pulse Rate:  [70-98] 76 (09/07 0730) Resp:  [11-27] 21 (09/07 0730) BP: (75-142)/(44-96) 77/58 (09/07 0730) SpO2:  [97 %-100 %] 99 % (09/07 0730) Weight:  [98.6 kg] 98.6 kg (09/07 0253) Last BM Date: 07/01/18  Intake/Output from previous day: 09/06 0701 - 09/07 0700 In: 5466.9 [P.O.:390; I.V.:3993.9; IV Piggyback:1083] Out: 1075 [Urine:600; Drains:325; Blood:150] Intake/Output this shift: No intake/output data recorded.  Lungs: CTAB  Cardiovascular: RRR  Abd: soft, upper abdominal skin very distended, dark blood coming from blake drain  Extremities: no edema  Neuro: AOx4  Lab Results: CBC  Recent Labs    07/02/18 1351 07/03/18 0213  WBC 9.5 8.4  HGB 13.7 10.2*  HCT 40.8 30.8*  PLT 193 209   BMET Recent Labs    06/30/18 0801 07/02/18 1351 07/03/18 0213  NA 140  --  141  K 3.7  --  3.3*  CL 102  --  105  CO2 30  --  28  GLUCOSE 79  --  162*  BUN 15  --  16  CREATININE 1.02* 1.03* 1.25*  CALCIUM 8.9  --  7.2*   PT/INR No results for input(s): LABPROT, INR in the last 72 hours. ABG No results for input(s): PHART, HCO3 in the last 72 hours.  Invalid input(s): PCO2, PO2  Studies/Results:  Anti-infectives: Anti-infectives (From admission, onward)   Start     Dose/Rate Route Frequency Ordered Stop   07/02/18 0600  ceFAZolin (ANCEF) IVPB 2g/100 mL premix     2 g 200 mL/hr over 30 Minutes Intravenous On call to O.R. 07/02/18 0545 07/02/18 0753      Medications: Scheduled Meds: . sodium chloride   Intravenous Once  . enoxaparin (LOVENOX) injection  40 mg Subcutaneous Q24H  . gabapentin  300 mg Oral BID  . lisinopril  20 mg Oral Daily   And  . hydrochlorothiazide  25 mg Oral Daily  . ketorolac  30 mg Intravenous Q6H  . loratadine  10 mg Oral Daily  . potassium chloride  10 mEq Oral Daily   Continuous Infusions: . dextrose 5 % and 0.45% NaCl 75 mL/hr at 07/03/18 0634   PRN Meds:.diphenhydrAMINE **OR** diphenhydrAMINE, hydrALAZINE, HYDROcodone-acetaminophen, morphine injection, ondansetron **OR** ondansetron (ZOFRAN) IV, polyethylene glycol, simethicone, traMADol  Rodman Pickle, MD Pg# [phone] [facility]

## 2018-07-03 NOTE — Anesthesia Preprocedure Evaluation (Signed)
Anesthesia Evaluation  Patient identified by MRN, date of birth, ID band Patient awake    Reviewed: Allergy & Precautions, NPO status , Patient's Chart, lab work & pertinent test results  Airway Mallampati: II  TM Distance: >3 FB Neck ROM: Full    Dental no notable dental hx.    Pulmonary asthma ,    Pulmonary exam normal breath sounds clear to auscultation       Cardiovascular hypertension, Normal cardiovascular exam Rhythm:Regular Rate:Normal     Neuro/Psych negative neurological ROS  negative psych ROS   GI/Hepatic negative GI ROS, Neg liver ROS,   Endo/Other  negative endocrine ROS  Renal/GU negative Renal ROS  negative genitourinary   Musculoskeletal negative musculoskeletal ROS (+)   Abdominal   Peds negative pediatric ROS (+)  Hematology negative hematology ROS (+)   Anesthesia Other Findings   Reproductive/Obstetrics negative OB ROS                             Anesthesia Physical Anesthesia Plan  ASA: II and emergent  Anesthesia Plan: General   Post-op Pain Management:    Induction: Intravenous, Rapid sequence and Cricoid pressure planned  PONV Risk Score and Plan: 4 or greater and Treatment may vary due to age or medical condition and Ondansetron  Airway Management Planned: Oral ETT  Additional Equipment:   Intra-op Plan:   Post-operative Plan: Extubation in OR  Informed Consent: I have reviewed the patients History and Physical, chart, labs and discussed the procedure including the risks, benefits and alternatives for the proposed anesthesia with the patient or authorized representative who has indicated his/her understanding and acceptance.   Dental advisory given  Plan Discussed with: CRNA  Anesthesia Plan Comments:         Anesthesia Quick Evaluation

## 2018-07-03 NOTE — Progress Notes (Signed)
Notified of hypotension, partial response to fluid bolus. No chest pain, initially dizzy now improved. Abdominal wound distended with more than expected dark red output concerning for post op bleed. -labs now -EKG -troponin

## 2018-07-04 ENCOUNTER — Encounter (HOSPITAL_COMMUNITY): Payer: Self-pay | Admitting: General Surgery

## 2018-07-04 LAB — TYPE AND SCREEN
ABO/RH(D): O POS
ANTIBODY SCREEN: NEGATIVE
UNIT DIVISION: 0
Unit division: 0

## 2018-07-04 LAB — BPAM RBC
BLOOD PRODUCT EXPIRATION DATE: 201910062359
BLOOD PRODUCT EXPIRATION DATE: 201910062359
ISSUE DATE / TIME: 201909070910
ISSUE DATE / TIME: 201909071110
UNIT TYPE AND RH: 5100
Unit Type and Rh: 5100

## 2018-07-04 LAB — CBC
HEMATOCRIT: 33.1 % — AB (ref 36.0–46.0)
Hemoglobin: 10.9 g/dL — ABNORMAL LOW (ref 12.0–15.0)
MCH: 29.6 pg (ref 26.0–34.0)
MCHC: 32.9 g/dL (ref 30.0–36.0)
MCV: 89.9 fL (ref 78.0–100.0)
Platelets: 152 10*3/uL (ref 150–400)
RBC: 3.68 MIL/uL — ABNORMAL LOW (ref 3.87–5.11)
RDW: 14.5 % (ref 11.5–15.5)
WBC: 6 10*3/uL (ref 4.0–10.5)

## 2018-07-04 LAB — POCT I-STAT 4, (NA,K, GLUC, HGB,HCT)
Glucose, Bld: 118 mg/dL — ABNORMAL HIGH (ref 70–99)
HCT: 28 % — ABNORMAL LOW (ref 36.0–46.0)
Hemoglobin: 9.5 g/dL — ABNORMAL LOW (ref 12.0–15.0)
Potassium: 3.7 mmol/L (ref 3.5–5.1)
Sodium: 136 mmol/L (ref 135–145)

## 2018-07-04 LAB — BASIC METABOLIC PANEL
Anion gap: 8 (ref 5–15)
BUN: 11 mg/dL (ref 6–20)
CALCIUM: 7.7 mg/dL — AB (ref 8.9–10.3)
CO2: 29 mmol/L (ref 22–32)
CREATININE: 1.16 mg/dL — AB (ref 0.44–1.00)
Chloride: 102 mmol/L (ref 98–111)
GFR calc Af Amer: >60 mL/min (ref >60)
GFR, EST NON AFRICAN AMERICAN: 53 mL/min — AB (ref >60)
GLUCOSE: 111 mg/dL — AB (ref 70–99)
Potassium: 3.8 mmol/L (ref 3.5–5.1)
Sodium: 139 mmol/L (ref 135–145)

## 2018-07-04 MED ORDER — PHENOL 1.4 % MT LIQD
1.0000 | OROMUCOSAL | Status: DC | PRN
  Filled 2018-07-04: qty 177

## 2018-07-04 NOTE — Progress Notes (Addendum)
Spoke with Dr Sheliah Hatch RE: pt vitals. He stated to initiate O2 via [place] and continue to monitor. O2 initiated, and pt responded positively. Will continue to monitor.  Encouraged pt to use IS. Educated patient on proper use and pt demonstrated and verbalized understanding of teaching. Encouraged pt to cough and deep breathe, encouraged splinting on patients abdomen for pain control. Pt demonstrated and verbalized understanding of teaching.

## 2018-07-05 ENCOUNTER — Encounter (HOSPITAL_COMMUNITY): Payer: Self-pay | Admitting: General Surgery

## 2018-07-05 LAB — CBC
HCT: 32.3 % — ABNORMAL LOW (ref 36.0–46.0)
Hemoglobin: 10.8 g/dL — ABNORMAL LOW (ref 12.0–15.0)
MCH: 30.2 pg (ref 26.0–34.0)
MCHC: 33.4 g/dL (ref 30.0–36.0)
MCV: 90.2 fL (ref 78.0–100.0)
PLATELETS: 168 10*3/uL (ref 150–400)
RBC: 3.58 MIL/uL — ABNORMAL LOW (ref 3.87–5.11)
RDW: 14.2 % (ref 11.5–15.5)
WBC: 6.3 10*3/uL (ref 4.0–10.5)

## 2018-07-05 MED ORDER — POLYETHYLENE GLYCOL 3350 17 G PO PACK
17.0000 g | PACK | Freq: Every day | ORAL | Status: DC
  Administered 2018-07-05 – 2018-07-08 (×4): 17 g via ORAL
  Filled 2018-07-05 (×4): qty 1

## 2018-07-05 NOTE — Progress Notes (Signed)
  Progress Note: General Surgery Service   Assessment/Plan: Active Problems:   Incisional hernia  s/p Procedure(s): EXPLORATORY LAPAROTOMY, RE-OPENING OF INCISION AND REMOVAL OF HEMATOMA 07/03/2018 -saline lock IV -continue IS and ambulation -miralax for constipation    LOS: 3 days  Chief Complaint/Subjective: +BM, feeling tired, tolerating diet, abdomen is sore  Objective: Vital signs in last 24 hours: Temp:  [98.2 F (36.8 C)-98.3 F (36.8 C)] 98.2 F (36.8 C) (09/09 0383) Pulse Rate:  [92-97] 92 (09/09 0613) Resp:  [15-17] 17 (09/09 0613) BP: (103-124)/(66-73) 103/73 (09/09 3383) SpO2:  [95 %-98 %] 98 % (09/09 0613) Last BM Date: 07/04/18  Intake/Output from previous day: 09/08 0701 - 09/09 0700 In: 1972.2 [P.O.:280; I.V.:1692.2] Out: 630 [Urine:500; Drains:130] Intake/Output this shift: Total I/O In: 240 [P.O.:240] Out: 15 [Drains:15]  Lungs: CTAB  Cardiovascular: RRR  Abd: soft, ATTP, ND, drain with thin serosang fluid  Extremities: no edema  Neuro: AOx4  Lab Results: CBC  Recent Labs    07/04/18 0619 07/05/18 0411  WBC 6.0 6.3  HGB 10.9* 10.8*  HCT 33.1* 32.3*  PLT 152 168   BMET Recent Labs    07/03/18 0213 07/03/18 1108 07/04/18 0619  NA 141 136 139  K 3.3* 3.7 3.8  CL 105  --  102  CO2 28  --  29  GLUCOSE 162* 118* 111*  BUN 16  --  11  CREATININE 1.25*  --  1.16*  CALCIUM 7.2*  --  7.7*   PT/INR No results for input(s): LABPROT, INR in the last 72 hours. ABG No results for input(s): PHART, HCO3 in the last 72 hours.  Invalid input(s): PCO2, PO2  Studies/Results:  Anti-infectives: Anti-infectives (From admission, onward)   Start     Dose/Rate Route Frequency Ordered Stop   07/03/18 1034  ceFAZolin (ANCEF) 2-4 GM/100ML-% IVPB    Note to Pharmacy:  Juanda Crumble   : cabinet override      07/03/18 1034 07/03/18 1316   07/02/18 0600  ceFAZolin (ANCEF) IVPB 2g/100 mL premix     2 g 200 mL/hr over 30 Minutes Intravenous On  call to O.R. 07/02/18 0545 07/02/18 0753      Medications: Scheduled Meds: . sodium chloride   Intravenous Once  . enoxaparin (LOVENOX) injection  40 mg Subcutaneous Q24H  . gabapentin  300 mg Oral BID  . lisinopril  20 mg Oral Daily   And  . hydrochlorothiazide  25 mg Oral Daily  . ketorolac  30 mg Intravenous Q6H  . loratadine  10 mg Oral Daily  . potassium chloride  10 mEq Oral Daily   Continuous Infusions: . dextrose 5 % and 0.45% NaCl 75 mL/hr at 07/05/18 0600  . lactated ringers 75 mL/hr at 07/03/18 1017   PRN Meds:.diphenhydrAMINE **OR** diphenhydrAMINE, hydrALAZINE, HYDROcodone-acetaminophen, morphine injection, ondansetron **OR** ondansetron (ZOFRAN) IV, phenol, polyethylene glycol, simethicone, traMADol  Rodman Pickle, MD Pg# [phone] [facility]

## 2018-07-06 ENCOUNTER — Inpatient Hospital Stay (HOSPITAL_COMMUNITY): Payer: 59

## 2018-07-06 MED ORDER — FUROSEMIDE 40 MG PO TABS
40.0000 mg | ORAL_TABLET | Freq: Once | ORAL | Status: AC
  Administered 2018-07-06: 40 mg via ORAL
  Filled 2018-07-06: qty 1

## 2018-07-06 MED ORDER — IPRATROPIUM-ALBUTEROL 0.5-2.5 (3) MG/3ML IN SOLN
3.0000 mL | Freq: Four times a day (QID) | RESPIRATORY_TRACT | Status: DC | PRN
  Administered 2018-07-06: 3 mL via RESPIRATORY_TRACT
  Filled 2018-07-06: qty 3

## 2018-07-06 NOTE — Progress Notes (Signed)
  Progress Note: General Surgery Service   Assessment/Plan: Active Problems:   Incisional hernia  s/p Procedure(s): EXPLORATORY LAPAROTOMY, RE-OPENING OF INCISION AND REMOVAL OF HEMATOMA 07/03/2018 -lasix today -chest xr -duonebs -continue ambulate -continue miralax -continue pain control    LOS: 4 days  Chief Complaint/Subjective: Still getting short of breath with activity, working on IS, pain moderate, tolerating diet, no BM  Objective: Vital signs in last 24 hours: Temp:  [97.6 F (36.4 C)-98.7 F (37.1 C)] 97.6 F (36.4 C) (09/10 0609) Pulse Rate:  [99-112] 101 (09/10 0609) Resp:  [20-22] 20 (09/10 0609) BP: (109-122)/(79-94) 122/79 (09/10 0609) SpO2:  [91 %-98 %] 98 % (09/10 0609) Last BM Date: 07/04/18  Intake/Output from previous day: 09/09 0701 - 09/10 0700 In: 5045 [P.O.:1220; I.V.:3825] Out: 430 [Urine:300; Drains:130] Intake/Output this shift: No intake/output data recorded.  Lungs: CTAB  Cardiovascular: RRR  Abd: soft, NT, ND, incision c/d/i, thin red fluid in drain  Extremities: +edema b/l  Neuro: AOx4  Lab Results: CBC  Recent Labs    07/04/18 0619 07/05/18 0411  WBC 6.0 6.3  HGB 10.9* 10.8*  HCT 33.1* 32.3*  PLT 152 168   BMET Recent Labs    07/03/18 1108 07/04/18 0619  NA 136 139  K 3.7 3.8  CL  --  102  CO2  --  29  GLUCOSE 118* 111*  BUN  --  11  CREATININE  --  1.16*  CALCIUM  --  7.7*   PT/INR No results for input(s): LABPROT, INR in the last 72 hours. ABG No results for input(s): PHART, HCO3 in the last 72 hours.  Invalid input(s): PCO2, PO2  Studies/Results:  Anti-infectives: Anti-infectives (From admission, onward)   Start     Dose/Rate Route Frequency Ordered Stop   07/03/18 1034  ceFAZolin (ANCEF) 2-4 GM/100ML-% IVPB    Note to Pharmacy:  Juanda Crumble   : cabinet override      07/03/18 1034 07/03/18 1316   07/02/18 0600  ceFAZolin (ANCEF) IVPB 2g/100 mL premix     2 g 200 mL/hr over 30 Minutes  Intravenous On call to O.R. 07/02/18 0545 07/02/18 0753      Medications: Scheduled Meds: . sodium chloride   Intravenous Once  . enoxaparin (LOVENOX) injection  40 mg Subcutaneous Q24H  . furosemide  40 mg Oral Once  . gabapentin  300 mg Oral BID  . lisinopril  20 mg Oral Daily   And  . hydrochlorothiazide  25 mg Oral Daily  . ketorolac  30 mg Intravenous Q6H  . loratadine  10 mg Oral Daily  . polyethylene glycol  17 g Oral Daily  . potassium chloride  10 mEq Oral Daily   Continuous Infusions: . lactated ringers 75 mL/hr at 07/03/18 1017   PRN Meds:.diphenhydrAMINE **OR** diphenhydrAMINE, hydrALAZINE, HYDROcodone-acetaminophen, ipratropium-albuterol, morphine injection, ondansetron **OR** ondansetron (ZOFRAN) IV, phenol, simethicone, traMADol  Rodman Pickle, MD Pg# [phone] [facility]

## 2018-07-07 LAB — BASIC METABOLIC PANEL
Anion gap: 9 (ref 5–15)
BUN: 18 mg/dL (ref 6–20)
CALCIUM: 8.6 mg/dL — AB (ref 8.9–10.3)
CHLORIDE: 101 mmol/L (ref 98–111)
CO2: 30 mmol/L (ref 22–32)
CREATININE: 1.01 mg/dL — AB (ref 0.44–1.00)
GFR calc non Af Amer: >60 mL/min (ref >60)
Glucose, Bld: 111 mg/dL — ABNORMAL HIGH (ref 70–99)
Potassium: 3.9 mmol/L (ref 3.5–5.1)
SODIUM: 140 mmol/L (ref 135–145)

## 2018-07-07 MED ORDER — MENTHOL 3 MG MT LOZG
LOZENGE | OROMUCOSAL | Status: AC
  Administered 2018-07-07: 1
  Filled 2018-07-07: qty 9

## 2018-07-07 MED ORDER — FUROSEMIDE 40 MG PO TABS
40.0000 mg | ORAL_TABLET | Freq: Every day | ORAL | Status: DC
  Administered 2018-07-07 – 2018-07-08 (×2): 40 mg via ORAL
  Filled 2018-07-07 (×2): qty 1

## 2018-07-07 MED ORDER — MENTHOL 3 MG MT LOZG: 1.0000 | LOZENGE | OROMUCOSAL | Status: DC | PRN

## 2018-07-07 MED ORDER — HYDROCODONE-ACETAMINOPHEN 5-325 MG PO TABS: 1.0000 | ORAL_TABLET | Freq: Four times a day (QID) | ORAL | 0 refills | Status: DC | PRN

## 2018-07-07 MED ORDER — FUROSEMIDE 40 MG PO TABS
40.0000 mg | ORAL_TABLET | Freq: Once | ORAL | Status: AC
  Administered 2018-07-07: 40 mg via ORAL
  Filled 2018-07-07: qty 1

## 2018-07-07 MED ORDER — GABAPENTIN 300 MG PO CAPS: 300.0000 mg | ORAL_CAPSULE | Freq: Two times a day (BID) | ORAL | 0 refills | Status: DC

## 2018-07-07 MED ORDER — IBUPROFEN 800 MG PO TABS: 800.0000 mg | ORAL_TABLET | Freq: Three times a day (TID) | ORAL | 0 refills | Status: DC | PRN

## 2018-07-07 NOTE — Progress Notes (Signed)
Family concerned patient became short of breath earlier and required oxygen. No desaturation noted. Will delay discharge due to this issue

## 2018-07-07 NOTE — Discharge Summary (Signed)
Physician Discharge Summary  Patient ID: Lisa Everett MRN: [id number] DOB/AGE: [birth year] 52 y.o.  Admit date: 07/02/2018 Discharge date: 07/07/2018  Admission Diagnoses:  Discharge Diagnoses:  Active Problems:   Incisional hernia   Discharged Condition: good  Hospital Course: she presented for incisional hernia repair.  Postoperatively she was admitted to the floor however she became hypotensive and was transferred down to the stepdown on evaluation appear that she had a large hematoma in her surgical area was taken back to the operating room the next day for reexploration and hemostasis.  After that she has not hospital an additional week she had a lot of pain and she had some atelectasis and was on incentive spirometry.  She eventually was able to walk around.  Return of bowel function and is tolerating a diet and was discharged home.  Consults: None  Significant Diagnostic Studies:  CBC    Component Value Date/Time   WBC 6.3 07/05/2018 0411   RBC 3.58 (L) 07/05/2018 0411   HGB 10.8 (L) 07/05/2018 0411   HCT 32.3 (L) 07/05/2018 0411   PLT 168 07/05/2018 0411   MCV 90.2 07/05/2018 0411   MCH 30.2 07/05/2018 0411   MCHC 33.4 07/05/2018 0411   RDW 14.2 07/05/2018 0411   LYMPHSABS 1.2 06/30/2018 0801   MONOABS 0.3 06/30/2018 0801   EOSABS 0.2 06/30/2018 0801   BASOSABS 0.0 06/30/2018 0801     Treatments: open incisional hernia repair, reexploration for bleeding  Discharge Exam: Blood pressure 130/82, pulse 100, temperature 98.3 F (36.8 C), temperature source Oral, resp. rate 20, height 5\' 1"  (1.549 m), weight 98.6 kg, last menstrual period 06/30/2018, SpO2 97 %. General appearance: alert and cooperative Head: Normocephalic, without obvious abnormality, atraumatic Neck: no adenopathy, no carotid bruit, no JVD, supple, symmetrical, trachea midline and thyroid not enlarged, symmetric, no tenderness/mass/nodules Resp: clear to auscultation bilaterally Cardio: regular  rate and rhythm, S1, S2 normal, no murmur, click, rub or gallop GI: soft, non-tender; bowel sounds normal; no masses,  no organomegaly and incision c/d/i  Disposition: Discharge disposition: 01-Home or Self Care       Discharge Instructions    Call MD for:  difficulty breathing, headache or visual disturbances   Complete by:  As directed    Call MD for:  persistant nausea and vomiting   Complete by:  As directed    Call MD for:  redness, tenderness, or signs of infection (pain, swelling, redness, odor or green/yellow discharge around incision site)   Complete by:  As directed    Call MD for:  severe uncontrolled pain   Complete by:  As directed    Call MD for:  temperature >100.4   Complete by:  As directed    Diet - low sodium heart healthy   Complete by:  As directed    Discharge wound care:   Complete by:  As directed    Remove dressing tomorrow. Ok to shower today. Continue drain maintenance daily or when full.   Increase activity slowly   Complete by:  As directed    Lifting restrictions   Complete by:  As directed    Do not lift more than 20 pounds for 3-4 weeks     Allergies as of 07/07/2018   No Known Allergies     Medication List    STOP taking these medications   meloxicam 15 MG tablet Commonly known as:  MOBIC     TAKE these medications   aspirin 81 MG tablet Take  81 mg by mouth daily.   BLACK COHOSH EXTRACT PO Take 1 capsule by mouth daily.   cetirizine 10 MG tablet Commonly known as:  ZYRTEC Take 10 mg by mouth at bedtime.   fluticasone 50 MCG/ACT nasal spray Commonly known as:  FLONASE Place 1-2 sprays into both nostrils daily for 7 days.   gabapentin 300 MG capsule Commonly known as:  NEURONTIN Take 1 capsule (300 mg total) by mouth 2 (two) times daily.   GLUCOSAMINE PO Take 1 tablet by mouth daily.   HYDROcodone-acetaminophen 5-325 MG tablet Commonly known as:  NORCO/VICODIN Take 1 tablet by mouth every 6 (six) hours as needed for  moderate pain.   ibuprofen 800 MG tablet Commonly known as:  ADVIL,MOTRIN Take 1 tablet (800 mg total) by mouth every 8 (eight) hours as needed.   lisinopril-hydrochlorothiazide 20-25 MG tablet Commonly known as:  PRINZIDE,ZESTORETIC Take 1 tablet by mouth daily.   potassium chloride 10 MEQ CR capsule Commonly known as:  MICRO-K Take 20 mEq by mouth daily.   SOY ISOFLAVONE PO Take 1 capsule by mouth daily.   Turmeric Curcumin 500 MG Caps Take 500 mg by mouth daily.   vitamin B-12 500 MCG tablet Commonly known as:  CYANOCOBALAMIN Take 500 mcg by mouth daily.   Vitamin D (Ergocalciferol) 50000 units Caps capsule Commonly known as:  DRISDOL Take 50,000 Units by mouth once a week.            Discharge Care Instructions  (From admission, onward)         Start     Ordered   07/07/18 0000  Discharge wound care:    Comments:  Remove dressing tomorrow. Ok to shower today. Continue drain maintenance daily or when full.   07/07/18 1414         Follow-up Information    Kinsinger, De Blanch, MD Follow up in 3 week(s).   Specialty:  General Surgery Contact information: [address] [place] [address] [phone]        [facility], [place] Follow up in 10 day(s).   Specialty:  General Surgery Why:  nurse visit for drain pull Contact information: [address] [place] [address] [phone]          Signed: De Blanch Kinsinger 07/07/2018, 2:18 PM

## 2018-07-07 NOTE — Progress Notes (Signed)
  Progress Note: General Surgery Service   Assessment/Plan: Active Problems:   Incisional hernia  s/p Procedure(s): EXPLORATORY LAPAROTOMY, RE-OPENING OF INCISION AND REMOVAL OF HEMATOMA 07/03/2018 -lasix today -increase ambulation -ok for shower -drain education -pain control via tablets    LOS: 5 days  Chief Complaint/Subjective: Pain moderate, ambulating, +BM, tolerating diet  Objective: Vital signs in last 24 hours: Temp:  [98.1 F (36.7 C)-98.5 F (36.9 C)] 98.3 F (36.8 C) (09/11 0553) Pulse Rate:  [92-106] 92 (09/11 0553) Resp:  [18-20] 18 (09/11 0553) BP: (128-155)/(91-108) 155/108 (09/11 0553) SpO2:  [91 %-98 %] 91 % (09/11 0553) Last BM Date: 07/06/18  Intake/Output from previous day: 09/10 0701 - 09/11 0700 In: 1360 [P.O.:1360] Out: 815 [Urine:600; Drains:215] Intake/Output this shift: No intake/output data recorded.   Abd: soft, ATTP, nondistended, drain with thin red liquid  Extremities: +edema  Neuro: AOx4  Lab Results: CBC  Recent Labs    07/05/18 0411  WBC 6.3  HGB 10.8*  HCT 32.3*  PLT 168   BMET Recent Labs    07/07/18 0444  NA 140  K 3.9  CL 101  CO2 30  GLUCOSE 111*  BUN 18  CREATININE 1.01*  CALCIUM 8.6*   PT/INR No results for input(s): LABPROT, INR in the last 72 hours. ABG No results for input(s): PHART, HCO3 in the last 72 hours.  Invalid input(s): PCO2, PO2  Studies/Results:  Anti-infectives: Anti-infectives (From admission, onward)   Start     Dose/Rate Route Frequency Ordered Stop   07/03/18 1034  ceFAZolin (ANCEF) 2-4 GM/100ML-% IVPB    Note to Pharmacy:  Juanda Crumble   : cabinet override      07/03/18 1034 07/03/18 1316   07/02/18 0600  ceFAZolin (ANCEF) IVPB 2g/100 mL premix     2 g 200 mL/hr over 30 Minutes Intravenous On call to O.R. 07/02/18 0545 07/02/18 0753      Medications: Scheduled Meds: . sodium chloride   Intravenous Once  . enoxaparin (LOVENOX) injection  40 mg Subcutaneous Q24H    . furosemide  40 mg Oral Daily  . gabapentin  300 mg Oral BID  . lisinopril  20 mg Oral Daily   And  . hydrochlorothiazide  25 mg Oral Daily  . ketorolac  30 mg Intravenous Q6H  . loratadine  10 mg Oral Daily  . polyethylene glycol  17 g Oral Daily  . potassium chloride  10 mEq Oral Daily   Continuous Infusions: . lactated ringers 75 mL/hr at 07/03/18 1017   PRN Meds:.diphenhydrAMINE **OR** diphenhydrAMINE, hydrALAZINE, HYDROcodone-acetaminophen, ipratropium-albuterol, morphine injection, ondansetron **OR** ondansetron (ZOFRAN) IV, phenol, simethicone, traMADol  Rodman Pickle, MD Pg# [phone] [facility]

## 2018-07-08 ENCOUNTER — Inpatient Hospital Stay (HOSPITAL_COMMUNITY): Payer: 59

## 2018-07-08 NOTE — Progress Notes (Signed)
Discharge instructions reviewed with patient and daughter. All questions answered. Demonstrated JP care and teaching. Provided dressing materials for home. Patient wheeled to vehicle with belongings by nurse tech

## 2018-08-14 DIAGNOSIS — E785 Hyperlipidemia, unspecified: Secondary | ICD-10-CM | POA: Diagnosis not present

## 2018-08-14 DIAGNOSIS — K439 Ventral hernia without obstruction or gangrene: Secondary | ICD-10-CM | POA: Diagnosis not present

## 2018-08-14 DIAGNOSIS — E559 Vitamin D deficiency, unspecified: Secondary | ICD-10-CM | POA: Diagnosis not present

## 2018-08-14 DIAGNOSIS — E538 Deficiency of other specified B group vitamins: Secondary | ICD-10-CM | POA: Diagnosis not present

## 2018-08-14 DIAGNOSIS — I10 Essential (primary) hypertension: Secondary | ICD-10-CM | POA: Diagnosis not present

## 2018-08-18 DIAGNOSIS — Z09 Encounter for follow-up examination after completed treatment for conditions other than malignant neoplasm: Secondary | ICD-10-CM | POA: Diagnosis not present

## 2018-08-18 DIAGNOSIS — R0683 Snoring: Secondary | ICD-10-CM | POA: Diagnosis not present

## 2018-08-26 DIAGNOSIS — J3089 Other allergic rhinitis: Secondary | ICD-10-CM | POA: Diagnosis not present

## 2018-08-26 DIAGNOSIS — I10 Essential (primary) hypertension: Secondary | ICD-10-CM | POA: Diagnosis not present

## 2018-08-26 DIAGNOSIS — G4710 Hypersomnia, unspecified: Secondary | ICD-10-CM | POA: Diagnosis not present

## 2018-08-28 DIAGNOSIS — E876 Hypokalemia: Secondary | ICD-10-CM | POA: Diagnosis not present

## 2018-08-28 DIAGNOSIS — I10 Essential (primary) hypertension: Secondary | ICD-10-CM | POA: Diagnosis not present

## 2018-08-28 DIAGNOSIS — E785 Hyperlipidemia, unspecified: Secondary | ICD-10-CM | POA: Diagnosis not present

## 2018-09-11 DIAGNOSIS — G4733 Obstructive sleep apnea (adult) (pediatric): Secondary | ICD-10-CM | POA: Diagnosis not present

## 2018-09-11 DIAGNOSIS — M25561 Pain in right knee: Secondary | ICD-10-CM | POA: Diagnosis not present

## 2018-09-11 DIAGNOSIS — E538 Deficiency of other specified B group vitamins: Secondary | ICD-10-CM | POA: Diagnosis not present

## 2018-10-05 DIAGNOSIS — G4733 Obstructive sleep apnea (adult) (pediatric): Secondary | ICD-10-CM | POA: Diagnosis not present

## 2018-12-10 ENCOUNTER — Ambulatory Visit (HOSPITAL_COMMUNITY)
Admission: EM | Admit: 2018-12-10 | Discharge: 2018-12-10 | Disposition: A | Discharge status: Home / Self Care | Payer: Managed Care, Other (non HMO) | Attending: Family Medicine | Admitting: Family Medicine

## 2018-12-10 ENCOUNTER — Encounter (HOSPITAL_COMMUNITY): Payer: Self-pay | Admitting: Emergency Medicine

## 2018-12-10 DIAGNOSIS — R059 Cough, unspecified: Secondary | ICD-10-CM

## 2018-12-10 DIAGNOSIS — R05 Cough: Secondary | ICD-10-CM

## 2018-12-10 NOTE — ED Triage Notes (Signed)
Pt c/o cough for several months, states her pcp has placed her on several rounds of antibiotics but she still is coughing.

## 2018-12-10 NOTE — Discharge Instructions (Signed)
Please try things such as zyrtec-D or allegra-D which is an antihistamine and decongestant.  Please try honey, vick's vapor rub, lozenges and humidifer for cough  You could try different medications such as those for reflux to see if they help with the cough.

## 2018-12-10 NOTE — ED Provider Notes (Signed)
[facility]    CSN: [id number] Arrival date & time: 12/10/18  1743     History   Chief Complaint No chief complaint on file.   HPI Lisa Everett is a 53 y.o. female.   She is presenting with an ongoing cough that is in 2 months of duration.  Reports the cough is worse at night.  Denies any PND.  She has been to her primary care physician and prescribed antibiotics at 3 different times as well as a steroid injection.  She feels like her symptoms are ongoing without much improvement.  Is a dry cough that is nonproductive.  Nonbloody.  Denies any tobacco use or history of asthma.  Denies any change in medications or travel outside the country.  HPI  Past Medical History:  Diagnosis Date  . Allergy    pet dander  . Asthma   . Family history of adverse reaction to anesthesia    Sister has [place] and vomiting  . Hot flashes   . Hypertension   . Hypokalemia   . Vitamin D deficiency     Patient Active Problem List   Diagnosis Date Noted  . Incisional hernia 07/02/2018    Past Surgical History:  Procedure Laterality Date  . CESAREAN SECTION    . HERNIA REPAIR     x3 3rd with Dr. Sheliah Hatch 07-02-18  . INCISIONAL HERNIA REPAIR N/A 07/02/2018   Procedure: HERNIA REPAIR INCISIONAL OPEN RECURRENT;  Surgeon: Kinsinger, De Blanch, MD;  Location: [facility];  Service: General;  Laterality: N/A;  . INSERTION OF MESH N/A 07/02/2018   Procedure: INSERTION OF MESH;  Surgeon: Sheliah Hatch De Blanch, MD;  Location: [facility];  Service: General;  Laterality: N/A;  . LAPAROTOMY N/A 07/03/2018   Procedure: EXPLORATORY LAPAROTOMY, RE-OPENING OF INCISION AND REMOVAL OF HEMATOMA;  Surgeon: Sheliah Hatch, De Blanch, MD;  Location: [facility];  Service: General;  Laterality: N/A;  . WISDOM TOOTH EXTRACTION      OB History    Gravida  2   Para  2   Term      Preterm      AB      Living  2     SAB      TAB      Ectopic      Multiple      Live Births               Home  Medications    Prior to Admission medications   Medication Sig Start Date End Date Taking? Authorizing Provider  benzonatate (TESSALON) 100 MG capsule Take by mouth 3 (three) times daily as needed for cough.   Yes [provider]  doxycycline (DORYX) 100 MG EC tablet Take 100 mg by mouth 2 (two) times daily.   Yes [provider]  meloxicam (MOBIC) 15 MG tablet Take 15 mg by mouth daily.   Yes [provider]  aspirin 81 MG tablet Take 81 mg by mouth daily.    [provider]  BLACK COHOSH EXTRACT PO Take 1 capsule by mouth daily.     [provider]  cetirizine (ZYRTEC) 10 MG tablet Take 10 mg by mouth at bedtime.     [provider]  fluticasone (FLONASE) 50 MCG/ACT nasal spray Place 1-2 sprays into both nostrils daily for 7 days. Patient not taking: Reported on 06/23/2018 02/18/18 06/23/18  Wieters, Hallie C, PA-C  gabapentin (NEURONTIN) 300 MG capsule Take 1 capsule (300 mg total) by  mouth 2 (two) times daily. 07/07/18   Kinsinger, De BlanchLuke Aaron, MD  Glucosamine HCl (GLUCOSAMINE PO) Take 1 tablet by mouth daily.    [provider]  HYDROcodone-acetaminophen (NORCO/VICODIN) 5-325 MG tablet Take 1 tablet by mouth every 6 (six) hours as needed for moderate pain. 07/07/18   Kinsinger, De BlanchLuke Aaron, MD  ibuprofen (ADVIL,MOTRIN) 800 MG tablet Take 1 tablet (800 mg total) by mouth every 8 (eight) hours as needed. 07/07/18   Kinsinger, De BlanchLuke Aaron, MD  lisinopril-hydrochlorothiazide (PRINZIDE,ZESTORETIC) 20-25 MG tablet Take 1 tablet by mouth daily.    [provider]  potassium chloride (MICRO-K) 10 MEQ CR capsule Take 20 mEq by mouth daily.     [provider]  SOY ISOFLAVONE PO Take 1 capsule by mouth daily.    [provider]  Turmeric Curcumin 500 MG CAPS Take 500 mg by mouth daily.    [provider]  vitamin B-12 (CYANOCOBALAMIN) 500 MCG tablet Take 500 mcg by mouth daily.     [provider]    Vitamin D, Ergocalciferol, (DRISDOL) 50000 units CAPS capsule Take 50,000 Units by mouth once a week. 06/07/18   [provider]    Family History Family History  Problem Relation Age of Onset  . Diabetes Mother   . Hypertension Mother   . Hypertension Father   . Osteoarthritis Sister   . Diabetes Paternal Grandmother   . Diabetes Paternal Aunt     Social History Social History   Tobacco Use  . Smoking status: Never Smoker  . Smokeless tobacco: Never Used  Substance Use Topics  . Alcohol use: No  . Drug use: No     Allergies   Patient has no known allergies.   Review of Systems Review of Systems  Constitutional: Negative for fever.  HENT: Negative for congestion.   Respiratory: Positive for cough.   Cardiovascular: Negative for chest pain.  Gastrointestinal: Negative for abdominal distention.  Musculoskeletal: Negative for back pain.  Skin: Negative for color change.  Neurological: Negative for weakness.  Hematological: Negative for adenopathy.  Psychiatric/Behavioral: Negative for agitation.     Physical Exam Triage Vital Signs ED Triage Vitals  Enc Vitals Group     BP 12/10/18 1757 113/72     Pulse Rate 12/10/18 1757 99     Resp 12/10/18 1757 20     Temp 12/10/18 1757 97.9 F (36.6 C)     Temp src --      SpO2 12/10/18 1757 100 %     Weight --      Height --      Head Circumference --      Peak Flow --      Pain Score 12/10/18 1758 0     Pain Loc --      Pain Edu? --      Excl. in GC? --    No data found.  Updated Vital Signs BP 113/72   Pulse 99   Temp 97.9 F (36.6 C)   Resp 20   SpO2 100%   Visual Acuity Right Eye Distance:   Left Eye Distance:   Bilateral Distance:    Right Eye Near:   Left Eye Near:    Bilateral Near:     Physical Exam Gen: NAD, alert, cooperative with exam, well-appearing ENT: normal lips, normal nasal mucosa, tympanic membranes clear and intact bilaterally, normal oropharynx, no cervical  lymphadenopathy Eye: normal EOM, normal conjunctiva and lids CV:  no edema, +2 pedal pulses, regular  rate and rhythm, S1-S2   Resp: no accessory muscle use, non-labored, clear to auscultation bilaterally, no crackles or wheezes  Skin: no rashes, no areas of induration  Neuro: normal tone, normal sensation to touch Psych:  normal insight, alert and oriented MSK: Normal gait, normal strength      UC Treatments / Results  Labs (all labs ordered are listed, but only abnormal results are displayed) Labs Reviewed - No data to display  EKG None  Radiology No results found.  Procedures Procedures (including critical care time)  Medications Ordered in UC Medications - No data to display  Initial Impression / Assessment and Plan / UC Course  I have reviewed the triage vital signs and the nursing notes.  Pertinent labs & imaging results that were available during my care of the patient were reviewed by me and considered in my medical decision making (see chart for details).     Lisa Everett is a 53 year old female that is presenting with an ongoing cough.  Has had several treatments of antibiotics as well as steroids with no improvement.  She does seem to have the cough that is worse at night but no signs of volume overload and no swelling today.  Possibly related reflux.  Discussed this and she opted not to be treated today.  Given indications to follow-up or if she worsens.  Could consider treating for reflux or referral to pulmonology.  Final Clinical Impressions(s) / UC Diagnoses   Final diagnoses:  Cough     Discharge Instructions     Please try things such as zyrtec-D or allegra-D which is an antihistamine and decongestant.  Please try honey, vick's vapor rub, lozenges and humidifer for cough  You could try different medications such as those for reflux to see if they help with the cough.     ED Prescriptions    None     Controlled Substance Prescriptions [place] Controlled  Substance Registry consulted? Not Applicable   Myra Rude, MD 12/10/18 2139

## 2019-04-12 ENCOUNTER — Other Ambulatory Visit: Payer: Self-pay | Admitting: Internal Medicine

## 2019-04-12 DIAGNOSIS — Z1231 Encounter for screening mammogram for malignant neoplasm of breast: Secondary | ICD-10-CM

## 2019-07-20 DIAGNOSIS — M546 Pain in thoracic spine: Secondary | ICD-10-CM | POA: Diagnosis not present

## 2019-07-20 DIAGNOSIS — M5442 Lumbago with sciatica, left side: Secondary | ICD-10-CM | POA: Diagnosis not present

## 2019-07-20 DIAGNOSIS — E559 Vitamin D deficiency, unspecified: Secondary | ICD-10-CM | POA: Diagnosis not present

## 2019-07-20 DIAGNOSIS — M9903 Segmental and somatic dysfunction of lumbar region: Secondary | ICD-10-CM | POA: Diagnosis not present

## 2019-07-20 DIAGNOSIS — M159 Polyosteoarthritis, unspecified: Secondary | ICD-10-CM | POA: Diagnosis not present

## 2019-07-20 DIAGNOSIS — M549 Dorsalgia, unspecified: Secondary | ICD-10-CM | POA: Diagnosis not present

## 2019-07-20 DIAGNOSIS — M5136 Other intervertebral disc degeneration, lumbar region: Secondary | ICD-10-CM | POA: Diagnosis not present

## 2019-07-20 DIAGNOSIS — E876 Hypokalemia: Secondary | ICD-10-CM | POA: Diagnosis not present

## 2019-07-20 DIAGNOSIS — J208 Acute bronchitis due to other specified organisms: Secondary | ICD-10-CM | POA: Diagnosis not present

## 2019-07-20 DIAGNOSIS — E538 Deficiency of other specified B group vitamins: Secondary | ICD-10-CM | POA: Diagnosis not present

## 2019-07-20 DIAGNOSIS — G4733 Obstructive sleep apnea (adult) (pediatric): Secondary | ICD-10-CM | POA: Diagnosis not present

## 2019-07-20 DIAGNOSIS — K439 Ventral hernia without obstruction or gangrene: Secondary | ICD-10-CM | POA: Diagnosis not present

## 2019-07-20 DIAGNOSIS — M9902 Segmental and somatic dysfunction of thoracic region: Secondary | ICD-10-CM | POA: Diagnosis not present

## 2019-07-21 ENCOUNTER — Ambulatory Visit
Admission: RE | Admit: 2019-07-21 | Discharge: 2019-07-21 | Disposition: A | Discharge status: Home / Self Care | Payer: 59 | Source: Ambulatory Visit | Attending: Internal Medicine | Admitting: Internal Medicine

## 2019-07-21 ENCOUNTER — Other Ambulatory Visit: Payer: Self-pay

## 2019-07-21 DIAGNOSIS — Z1231 Encounter for screening mammogram for malignant neoplasm of breast: Secondary | ICD-10-CM | POA: Diagnosis not present

## 2019-07-21 DIAGNOSIS — H524 Presbyopia: Secondary | ICD-10-CM | POA: Diagnosis not present

## 2019-07-21 DIAGNOSIS — H5213 Myopia, bilateral: Secondary | ICD-10-CM | POA: Diagnosis not present

## 2019-07-21 DIAGNOSIS — M549 Dorsalgia, unspecified: Secondary | ICD-10-CM | POA: Diagnosis not present

## 2019-07-22 ENCOUNTER — Other Ambulatory Visit: Payer: Self-pay | Admitting: Internal Medicine

## 2019-07-22 DIAGNOSIS — R928 Other abnormal and inconclusive findings on diagnostic imaging of breast: Secondary | ICD-10-CM

## 2019-07-23 DIAGNOSIS — M25561 Pain in right knee: Secondary | ICD-10-CM | POA: Diagnosis not present

## 2019-07-23 DIAGNOSIS — E538 Deficiency of other specified B group vitamins: Secondary | ICD-10-CM | POA: Diagnosis not present

## 2019-07-23 DIAGNOSIS — G4733 Obstructive sleep apnea (adult) (pediatric): Secondary | ICD-10-CM | POA: Diagnosis not present

## 2019-07-23 DIAGNOSIS — Z6839 Body mass index (BMI) 39.0-39.9, adult: Secondary | ICD-10-CM | POA: Diagnosis not present

## 2019-07-23 DIAGNOSIS — E876 Hypokalemia: Secondary | ICD-10-CM | POA: Diagnosis not present

## 2019-07-23 DIAGNOSIS — K439 Ventral hernia without obstruction or gangrene: Secondary | ICD-10-CM | POA: Diagnosis not present

## 2019-07-23 DIAGNOSIS — M159 Polyosteoarthritis, unspecified: Secondary | ICD-10-CM | POA: Diagnosis not present

## 2019-07-23 DIAGNOSIS — E559 Vitamin D deficiency, unspecified: Secondary | ICD-10-CM | POA: Diagnosis not present

## 2019-07-23 DIAGNOSIS — E669 Obesity, unspecified: Secondary | ICD-10-CM | POA: Diagnosis not present

## 2019-07-27 DIAGNOSIS — M9902 Segmental and somatic dysfunction of thoracic region: Secondary | ICD-10-CM | POA: Diagnosis not present

## 2019-07-27 DIAGNOSIS — M5136 Other intervertebral disc degeneration, lumbar region: Secondary | ICD-10-CM | POA: Diagnosis not present

## 2019-07-27 DIAGNOSIS — M5442 Lumbago with sciatica, left side: Secondary | ICD-10-CM | POA: Diagnosis not present

## 2019-07-27 DIAGNOSIS — M546 Pain in thoracic spine: Secondary | ICD-10-CM | POA: Diagnosis not present

## 2019-07-27 DIAGNOSIS — M9903 Segmental and somatic dysfunction of lumbar region: Secondary | ICD-10-CM | POA: Diagnosis not present

## 2019-08-06 DIAGNOSIS — G4733 Obstructive sleep apnea (adult) (pediatric): Secondary | ICD-10-CM | POA: Diagnosis not present

## 2019-08-06 DIAGNOSIS — M25562 Pain in left knee: Secondary | ICD-10-CM | POA: Diagnosis not present

## 2019-08-06 DIAGNOSIS — E559 Vitamin D deficiency, unspecified: Secondary | ICD-10-CM | POA: Diagnosis not present

## 2019-08-06 DIAGNOSIS — E538 Deficiency of other specified B group vitamins: Secondary | ICD-10-CM | POA: Diagnosis not present

## 2019-08-06 DIAGNOSIS — M159 Polyosteoarthritis, unspecified: Secondary | ICD-10-CM | POA: Diagnosis not present

## 2019-08-06 DIAGNOSIS — M549 Dorsalgia, unspecified: Secondary | ICD-10-CM | POA: Diagnosis not present

## 2019-08-06 DIAGNOSIS — G8929 Other chronic pain: Secondary | ICD-10-CM | POA: Diagnosis not present

## 2019-08-06 DIAGNOSIS — K439 Ventral hernia without obstruction or gangrene: Secondary | ICD-10-CM | POA: Diagnosis not present

## 2019-08-06 DIAGNOSIS — E876 Hypokalemia: Secondary | ICD-10-CM | POA: Diagnosis not present

## 2019-08-10 ENCOUNTER — Other Ambulatory Visit: Payer: 59

## 2019-08-10 DIAGNOSIS — M5442 Lumbago with sciatica, left side: Secondary | ICD-10-CM | POA: Diagnosis not present

## 2019-08-10 DIAGNOSIS — M9903 Segmental and somatic dysfunction of lumbar region: Secondary | ICD-10-CM | POA: Diagnosis not present

## 2019-08-10 DIAGNOSIS — M9902 Segmental and somatic dysfunction of thoracic region: Secondary | ICD-10-CM | POA: Diagnosis not present

## 2019-08-10 DIAGNOSIS — M546 Pain in thoracic spine: Secondary | ICD-10-CM | POA: Diagnosis not present

## 2019-08-10 DIAGNOSIS — M5136 Other intervertebral disc degeneration, lumbar region: Secondary | ICD-10-CM | POA: Diagnosis not present

## 2019-08-19 ENCOUNTER — Ambulatory Visit
Admission: RE | Admit: 2019-08-19 | Discharge: 2019-08-19 | Disposition: A | Discharge status: Home / Self Care | Payer: 59 | Source: Ambulatory Visit | Attending: Internal Medicine | Admitting: Internal Medicine

## 2019-08-19 ENCOUNTER — Other Ambulatory Visit: Payer: Self-pay

## 2019-08-19 ENCOUNTER — Other Ambulatory Visit: Payer: Self-pay | Admitting: Internal Medicine

## 2019-08-19 DIAGNOSIS — N6012 Diffuse cystic mastopathy of left breast: Secondary | ICD-10-CM | POA: Diagnosis not present

## 2019-08-19 DIAGNOSIS — N6320 Unspecified lump in the left breast, unspecified quadrant: Secondary | ICD-10-CM

## 2019-08-19 DIAGNOSIS — R928 Other abnormal and inconclusive findings on diagnostic imaging of breast: Secondary | ICD-10-CM

## 2019-08-24 DIAGNOSIS — M5442 Lumbago with sciatica, left side: Secondary | ICD-10-CM | POA: Diagnosis not present

## 2019-08-24 DIAGNOSIS — M546 Pain in thoracic spine: Secondary | ICD-10-CM | POA: Diagnosis not present

## 2019-08-24 DIAGNOSIS — M9903 Segmental and somatic dysfunction of lumbar region: Secondary | ICD-10-CM | POA: Diagnosis not present

## 2019-08-24 DIAGNOSIS — M9902 Segmental and somatic dysfunction of thoracic region: Secondary | ICD-10-CM | POA: Diagnosis not present

## 2019-08-24 DIAGNOSIS — M5136 Other intervertebral disc degeneration, lumbar region: Secondary | ICD-10-CM | POA: Diagnosis not present

## 2019-08-27 DIAGNOSIS — E559 Vitamin D deficiency, unspecified: Secondary | ICD-10-CM | POA: Diagnosis not present

## 2019-08-27 DIAGNOSIS — G4733 Obstructive sleep apnea (adult) (pediatric): Secondary | ICD-10-CM | POA: Diagnosis not present

## 2019-08-27 DIAGNOSIS — J45909 Unspecified asthma, uncomplicated: Secondary | ICD-10-CM | POA: Diagnosis not present

## 2019-08-27 DIAGNOSIS — M549 Dorsalgia, unspecified: Secondary | ICD-10-CM | POA: Diagnosis not present

## 2019-08-27 DIAGNOSIS — E538 Deficiency of other specified B group vitamins: Secondary | ICD-10-CM | POA: Diagnosis not present

## 2019-08-27 DIAGNOSIS — K439 Ventral hernia without obstruction or gangrene: Secondary | ICD-10-CM | POA: Diagnosis not present

## 2019-08-27 DIAGNOSIS — E785 Hyperlipidemia, unspecified: Secondary | ICD-10-CM | POA: Diagnosis not present

## 2019-08-27 DIAGNOSIS — I10 Essential (primary) hypertension: Secondary | ICD-10-CM | POA: Diagnosis not present

## 2019-08-27 DIAGNOSIS — E876 Hypokalemia: Secondary | ICD-10-CM | POA: Diagnosis not present

## 2019-08-27 DIAGNOSIS — M159 Polyosteoarthritis, unspecified: Secondary | ICD-10-CM | POA: Diagnosis not present

## 2019-09-07 DIAGNOSIS — M5442 Lumbago with sciatica, left side: Secondary | ICD-10-CM | POA: Diagnosis not present

## 2019-09-07 DIAGNOSIS — M546 Pain in thoracic spine: Secondary | ICD-10-CM | POA: Diagnosis not present

## 2019-09-07 DIAGNOSIS — M9903 Segmental and somatic dysfunction of lumbar region: Secondary | ICD-10-CM | POA: Diagnosis not present

## 2019-09-07 DIAGNOSIS — M5136 Other intervertebral disc degeneration, lumbar region: Secondary | ICD-10-CM | POA: Diagnosis not present

## 2019-09-07 DIAGNOSIS — M9902 Segmental and somatic dysfunction of thoracic region: Secondary | ICD-10-CM | POA: Diagnosis not present

## 2019-09-20 DIAGNOSIS — E559 Vitamin D deficiency, unspecified: Secondary | ICD-10-CM | POA: Diagnosis not present

## 2019-09-20 DIAGNOSIS — M25562 Pain in left knee: Secondary | ICD-10-CM | POA: Diagnosis not present

## 2019-09-20 DIAGNOSIS — M5442 Lumbago with sciatica, left side: Secondary | ICD-10-CM | POA: Diagnosis not present

## 2019-09-20 DIAGNOSIS — M549 Dorsalgia, unspecified: Secondary | ICD-10-CM | POA: Diagnosis not present

## 2019-09-20 DIAGNOSIS — M5136 Other intervertebral disc degeneration, lumbar region: Secondary | ICD-10-CM | POA: Diagnosis not present

## 2019-09-20 DIAGNOSIS — M9902 Segmental and somatic dysfunction of thoracic region: Secondary | ICD-10-CM | POA: Diagnosis not present

## 2019-09-20 DIAGNOSIS — E538 Deficiency of other specified B group vitamins: Secondary | ICD-10-CM | POA: Diagnosis not present

## 2019-09-20 DIAGNOSIS — M546 Pain in thoracic spine: Secondary | ICD-10-CM | POA: Diagnosis not present

## 2019-09-20 DIAGNOSIS — M9903 Segmental and somatic dysfunction of lumbar region: Secondary | ICD-10-CM | POA: Diagnosis not present

## 2019-09-20 DIAGNOSIS — K439 Ventral hernia without obstruction or gangrene: Secondary | ICD-10-CM | POA: Diagnosis not present

## 2019-09-20 DIAGNOSIS — M159 Polyosteoarthritis, unspecified: Secondary | ICD-10-CM | POA: Diagnosis not present

## 2019-09-20 DIAGNOSIS — J45909 Unspecified asthma, uncomplicated: Secondary | ICD-10-CM | POA: Diagnosis not present

## 2019-09-20 DIAGNOSIS — G4733 Obstructive sleep apnea (adult) (pediatric): Secondary | ICD-10-CM | POA: Diagnosis not present

## 2019-09-20 DIAGNOSIS — E876 Hypokalemia: Secondary | ICD-10-CM | POA: Diagnosis not present

## 2019-09-24 DIAGNOSIS — M25562 Pain in left knee: Secondary | ICD-10-CM | POA: Diagnosis not present

## 2019-09-24 DIAGNOSIS — G4733 Obstructive sleep apnea (adult) (pediatric): Secondary | ICD-10-CM | POA: Diagnosis not present

## 2019-09-24 DIAGNOSIS — J45909 Unspecified asthma, uncomplicated: Secondary | ICD-10-CM | POA: Diagnosis not present

## 2019-09-24 DIAGNOSIS — E876 Hypokalemia: Secondary | ICD-10-CM | POA: Diagnosis not present

## 2019-09-24 DIAGNOSIS — M549 Dorsalgia, unspecified: Secondary | ICD-10-CM | POA: Diagnosis not present

## 2019-09-24 DIAGNOSIS — K439 Ventral hernia without obstruction or gangrene: Secondary | ICD-10-CM | POA: Diagnosis not present

## 2019-09-24 DIAGNOSIS — E538 Deficiency of other specified B group vitamins: Secondary | ICD-10-CM | POA: Diagnosis not present

## 2019-09-24 DIAGNOSIS — M159 Polyosteoarthritis, unspecified: Secondary | ICD-10-CM | POA: Diagnosis not present

## 2019-09-24 DIAGNOSIS — E559 Vitamin D deficiency, unspecified: Secondary | ICD-10-CM | POA: Diagnosis not present

## 2019-10-12 DIAGNOSIS — M5136 Other intervertebral disc degeneration, lumbar region: Secondary | ICD-10-CM | POA: Diagnosis not present

## 2019-10-12 DIAGNOSIS — M9902 Segmental and somatic dysfunction of thoracic region: Secondary | ICD-10-CM | POA: Diagnosis not present

## 2019-10-12 DIAGNOSIS — M9903 Segmental and somatic dysfunction of lumbar region: Secondary | ICD-10-CM | POA: Diagnosis not present

## 2019-10-12 DIAGNOSIS — M546 Pain in thoracic spine: Secondary | ICD-10-CM | POA: Diagnosis not present

## 2019-10-12 DIAGNOSIS — M5442 Lumbago with sciatica, left side: Secondary | ICD-10-CM | POA: Diagnosis not present

## 2019-11-09 DIAGNOSIS — M546 Pain in thoracic spine: Secondary | ICD-10-CM | POA: Diagnosis not present

## 2019-11-09 DIAGNOSIS — M9903 Segmental and somatic dysfunction of lumbar region: Secondary | ICD-10-CM | POA: Diagnosis not present

## 2019-11-09 DIAGNOSIS — M5136 Other intervertebral disc degeneration, lumbar region: Secondary | ICD-10-CM | POA: Diagnosis not present

## 2019-11-09 DIAGNOSIS — M5442 Lumbago with sciatica, left side: Secondary | ICD-10-CM | POA: Diagnosis not present

## 2019-11-09 DIAGNOSIS — M9902 Segmental and somatic dysfunction of thoracic region: Secondary | ICD-10-CM | POA: Diagnosis not present

## 2019-11-11 DIAGNOSIS — Z113 Encounter for screening for infections with a predominantly sexual mode of transmission: Secondary | ICD-10-CM | POA: Diagnosis not present

## 2019-11-11 DIAGNOSIS — Z01419 Encounter for gynecological examination (general) (routine) without abnormal findings: Secondary | ICD-10-CM | POA: Diagnosis not present

## 2019-11-11 DIAGNOSIS — Z6838 Body mass index (BMI) 38.0-38.9, adult: Secondary | ICD-10-CM | POA: Diagnosis not present

## 2019-11-30 DIAGNOSIS — M9903 Segmental and somatic dysfunction of lumbar region: Secondary | ICD-10-CM | POA: Diagnosis not present

## 2019-11-30 DIAGNOSIS — M5136 Other intervertebral disc degeneration, lumbar region: Secondary | ICD-10-CM | POA: Diagnosis not present

## 2019-11-30 DIAGNOSIS — M5442 Lumbago with sciatica, left side: Secondary | ICD-10-CM | POA: Diagnosis not present

## 2019-11-30 DIAGNOSIS — M546 Pain in thoracic spine: Secondary | ICD-10-CM | POA: Diagnosis not present

## 2019-11-30 DIAGNOSIS — M9902 Segmental and somatic dysfunction of thoracic region: Secondary | ICD-10-CM | POA: Diagnosis not present

## 2019-12-03 DIAGNOSIS — E538 Deficiency of other specified B group vitamins: Secondary | ICD-10-CM | POA: Diagnosis not present

## 2019-12-03 DIAGNOSIS — M159 Polyosteoarthritis, unspecified: Secondary | ICD-10-CM | POA: Diagnosis not present

## 2019-12-03 DIAGNOSIS — K439 Ventral hernia without obstruction or gangrene: Secondary | ICD-10-CM | POA: Diagnosis not present

## 2019-12-03 DIAGNOSIS — G4733 Obstructive sleep apnea (adult) (pediatric): Secondary | ICD-10-CM | POA: Diagnosis not present

## 2019-12-03 DIAGNOSIS — I10 Essential (primary) hypertension: Secondary | ICD-10-CM | POA: Diagnosis not present

## 2019-12-03 DIAGNOSIS — E785 Hyperlipidemia, unspecified: Secondary | ICD-10-CM | POA: Diagnosis not present

## 2019-12-03 DIAGNOSIS — M549 Dorsalgia, unspecified: Secondary | ICD-10-CM | POA: Diagnosis not present

## 2019-12-03 DIAGNOSIS — E876 Hypokalemia: Secondary | ICD-10-CM | POA: Diagnosis not present

## 2019-12-03 DIAGNOSIS — E559 Vitamin D deficiency, unspecified: Secondary | ICD-10-CM | POA: Diagnosis not present

## 2019-12-03 DIAGNOSIS — G8929 Other chronic pain: Secondary | ICD-10-CM | POA: Diagnosis not present

## 2019-12-08 MED FILL — BENZONATATE 200 MG CAP: 200 | 10 days supply | Qty: 30 | Fill #0

## 2019-12-08 MED FILL — LISINOPRIL-HCTZ 20-12.5 MG: 20-12.5 | 90 days supply | Qty: 90 | Fill #0

## 2019-12-08 MED FILL — VIT D2 1.25 MG (50,000 UNIT: 1.25 MG | 84 days supply | Qty: 12 | Fill #0

## 2019-12-08 MED FILL — POTASSIUM CHLORIDE CRYS ER: 20 | 30 days supply | Qty: 90 | Fill #0

## 2019-12-08 MED FILL — MELOXICAM 15 MG TABLET: 15 | 90 days supply | Qty: 90 | Fill #0

## 2019-12-08 MED FILL — ALBUTEROL SULFATE HFA 108 (: 108 (90 BAS | 25 days supply | Qty: 9 | Fill #0

## 2019-12-14 DIAGNOSIS — M9902 Segmental and somatic dysfunction of thoracic region: Secondary | ICD-10-CM | POA: Diagnosis not present

## 2019-12-14 DIAGNOSIS — M546 Pain in thoracic spine: Secondary | ICD-10-CM | POA: Diagnosis not present

## 2019-12-14 DIAGNOSIS — M5136 Other intervertebral disc degeneration, lumbar region: Secondary | ICD-10-CM | POA: Diagnosis not present

## 2019-12-14 DIAGNOSIS — M9903 Segmental and somatic dysfunction of lumbar region: Secondary | ICD-10-CM | POA: Diagnosis not present

## 2019-12-14 DIAGNOSIS — M5442 Lumbago with sciatica, left side: Secondary | ICD-10-CM | POA: Diagnosis not present

## 2019-12-19 DIAGNOSIS — K439 Ventral hernia without obstruction or gangrene: Secondary | ICD-10-CM | POA: Diagnosis not present

## 2019-12-19 DIAGNOSIS — M549 Dorsalgia, unspecified: Secondary | ICD-10-CM | POA: Diagnosis not present

## 2019-12-19 DIAGNOSIS — G4733 Obstructive sleep apnea (adult) (pediatric): Secondary | ICD-10-CM | POA: Diagnosis not present

## 2019-12-19 DIAGNOSIS — G8929 Other chronic pain: Secondary | ICD-10-CM | POA: Diagnosis not present

## 2019-12-19 DIAGNOSIS — E559 Vitamin D deficiency, unspecified: Secondary | ICD-10-CM | POA: Diagnosis not present

## 2019-12-19 DIAGNOSIS — E538 Deficiency of other specified B group vitamins: Secondary | ICD-10-CM | POA: Diagnosis not present

## 2019-12-19 DIAGNOSIS — M159 Polyosteoarthritis, unspecified: Secondary | ICD-10-CM | POA: Diagnosis not present

## 2019-12-19 DIAGNOSIS — E876 Hypokalemia: Secondary | ICD-10-CM | POA: Diagnosis not present

## 2019-12-19 DIAGNOSIS — M25562 Pain in left knee: Secondary | ICD-10-CM | POA: Diagnosis not present

## 2019-12-20 DIAGNOSIS — G8929 Other chronic pain: Secondary | ICD-10-CM | POA: Diagnosis not present

## 2019-12-20 DIAGNOSIS — K439 Ventral hernia without obstruction or gangrene: Secondary | ICD-10-CM | POA: Diagnosis not present

## 2019-12-20 DIAGNOSIS — E538 Deficiency of other specified B group vitamins: Secondary | ICD-10-CM | POA: Diagnosis not present

## 2019-12-20 DIAGNOSIS — M25562 Pain in left knee: Secondary | ICD-10-CM | POA: Diagnosis not present

## 2019-12-20 DIAGNOSIS — G4733 Obstructive sleep apnea (adult) (pediatric): Secondary | ICD-10-CM | POA: Diagnosis not present

## 2019-12-20 DIAGNOSIS — M159 Polyosteoarthritis, unspecified: Secondary | ICD-10-CM | POA: Diagnosis not present

## 2019-12-20 DIAGNOSIS — E876 Hypokalemia: Secondary | ICD-10-CM | POA: Diagnosis not present

## 2019-12-20 DIAGNOSIS — E559 Vitamin D deficiency, unspecified: Secondary | ICD-10-CM | POA: Diagnosis not present

## 2019-12-20 DIAGNOSIS — M549 Dorsalgia, unspecified: Secondary | ICD-10-CM | POA: Diagnosis not present

## 2019-12-22 DIAGNOSIS — Z1212 Encounter for screening for malignant neoplasm of rectum: Secondary | ICD-10-CM | POA: Diagnosis not present

## 2019-12-22 DIAGNOSIS — Z1211 Encounter for screening for malignant neoplasm of colon: Secondary | ICD-10-CM | POA: Diagnosis not present

## 2019-12-27 DIAGNOSIS — M25562 Pain in left knee: Secondary | ICD-10-CM | POA: Diagnosis not present

## 2019-12-27 DIAGNOSIS — M9903 Segmental and somatic dysfunction of lumbar region: Secondary | ICD-10-CM | POA: Diagnosis not present

## 2019-12-27 DIAGNOSIS — M5136 Other intervertebral disc degeneration, lumbar region: Secondary | ICD-10-CM | POA: Diagnosis not present

## 2019-12-27 DIAGNOSIS — M546 Pain in thoracic spine: Secondary | ICD-10-CM | POA: Diagnosis not present

## 2019-12-27 DIAGNOSIS — M159 Polyosteoarthritis, unspecified: Secondary | ICD-10-CM | POA: Diagnosis not present

## 2019-12-27 DIAGNOSIS — M9902 Segmental and somatic dysfunction of thoracic region: Secondary | ICD-10-CM | POA: Diagnosis not present

## 2019-12-27 DIAGNOSIS — G4733 Obstructive sleep apnea (adult) (pediatric): Secondary | ICD-10-CM | POA: Diagnosis not present

## 2019-12-27 DIAGNOSIS — E876 Hypokalemia: Secondary | ICD-10-CM | POA: Diagnosis not present

## 2019-12-27 DIAGNOSIS — M549 Dorsalgia, unspecified: Secondary | ICD-10-CM | POA: Diagnosis not present

## 2019-12-27 DIAGNOSIS — E669 Obesity, unspecified: Secondary | ICD-10-CM | POA: Diagnosis not present

## 2019-12-27 DIAGNOSIS — M5442 Lumbago with sciatica, left side: Secondary | ICD-10-CM | POA: Diagnosis not present

## 2019-12-27 DIAGNOSIS — J45909 Unspecified asthma, uncomplicated: Secondary | ICD-10-CM | POA: Diagnosis not present

## 2019-12-27 DIAGNOSIS — K439 Ventral hernia without obstruction or gangrene: Secondary | ICD-10-CM | POA: Diagnosis not present

## 2019-12-27 DIAGNOSIS — E538 Deficiency of other specified B group vitamins: Secondary | ICD-10-CM | POA: Diagnosis not present

## 2019-12-27 LAB — COLOGUARD: COLOGUARD: NEGATIVE

## 2019-12-27 LAB — EXTERNAL GENERIC LAB PROCEDURE: COLOGUARD: NEGATIVE

## 2020-01-02 DIAGNOSIS — M25562 Pain in left knee: Secondary | ICD-10-CM | POA: Diagnosis not present

## 2020-01-04 DIAGNOSIS — M25562 Pain in left knee: Secondary | ICD-10-CM | POA: Diagnosis not present

## 2020-01-05 DIAGNOSIS — Z30431 Encounter for routine checking of intrauterine contraceptive device: Secondary | ICD-10-CM | POA: Diagnosis not present

## 2020-01-17 DIAGNOSIS — M25562 Pain in left knee: Secondary | ICD-10-CM | POA: Diagnosis not present

## 2020-01-18 DIAGNOSIS — M546 Pain in thoracic spine: Secondary | ICD-10-CM | POA: Diagnosis not present

## 2020-01-18 DIAGNOSIS — M5136 Other intervertebral disc degeneration, lumbar region: Secondary | ICD-10-CM | POA: Diagnosis not present

## 2020-01-18 DIAGNOSIS — M9902 Segmental and somatic dysfunction of thoracic region: Secondary | ICD-10-CM | POA: Diagnosis not present

## 2020-01-18 DIAGNOSIS — M5442 Lumbago with sciatica, left side: Secondary | ICD-10-CM | POA: Diagnosis not present

## 2020-01-18 DIAGNOSIS — M9903 Segmental and somatic dysfunction of lumbar region: Secondary | ICD-10-CM | POA: Diagnosis not present

## 2020-01-19 DIAGNOSIS — M25562 Pain in left knee: Secondary | ICD-10-CM | POA: Diagnosis not present

## 2020-01-31 DIAGNOSIS — M25562 Pain in left knee: Secondary | ICD-10-CM | POA: Diagnosis not present

## 2020-02-02 DIAGNOSIS — M25562 Pain in left knee: Secondary | ICD-10-CM | POA: Diagnosis not present

## 2020-02-06 DIAGNOSIS — M25562 Pain in left knee: Secondary | ICD-10-CM | POA: Diagnosis not present

## 2020-02-07 DIAGNOSIS — M25562 Pain in left knee: Secondary | ICD-10-CM | POA: Diagnosis not present

## 2020-02-08 DIAGNOSIS — M546 Pain in thoracic spine: Secondary | ICD-10-CM | POA: Diagnosis not present

## 2020-02-08 DIAGNOSIS — M5136 Other intervertebral disc degeneration, lumbar region: Secondary | ICD-10-CM | POA: Diagnosis not present

## 2020-02-08 DIAGNOSIS — M5442 Lumbago with sciatica, left side: Secondary | ICD-10-CM | POA: Diagnosis not present

## 2020-02-08 DIAGNOSIS — M9902 Segmental and somatic dysfunction of thoracic region: Secondary | ICD-10-CM | POA: Diagnosis not present

## 2020-02-08 DIAGNOSIS — M9903 Segmental and somatic dysfunction of lumbar region: Secondary | ICD-10-CM | POA: Diagnosis not present

## 2020-02-20 ENCOUNTER — Other Ambulatory Visit: Payer: 59

## 2020-02-20 ENCOUNTER — Other Ambulatory Visit: Payer: Self-pay

## 2020-02-20 ENCOUNTER — Ambulatory Visit
Admission: RE | Admit: 2020-02-20 | Discharge: 2020-02-20 | Disposition: A | Discharge status: Home / Self Care | Payer: 59 | Source: Ambulatory Visit | Attending: Internal Medicine | Admitting: Internal Medicine

## 2020-02-20 DIAGNOSIS — N6320 Unspecified lump in the left breast, unspecified quadrant: Secondary | ICD-10-CM

## 2020-02-20 DIAGNOSIS — N6012 Diffuse cystic mastopathy of left breast: Secondary | ICD-10-CM | POA: Diagnosis not present

## 2020-02-20 MED FILL — VIT D2 1.25 MG (50,000 UNIT: 1.25 MG | 84 days supply | Qty: 12 | Fill #1

## 2020-02-21 DIAGNOSIS — E876 Hypokalemia: Secondary | ICD-10-CM | POA: Diagnosis not present

## 2020-02-21 DIAGNOSIS — L309 Dermatitis, unspecified: Secondary | ICD-10-CM | POA: Diagnosis not present

## 2020-02-21 DIAGNOSIS — G8929 Other chronic pain: Secondary | ICD-10-CM | POA: Diagnosis not present

## 2020-02-21 DIAGNOSIS — M159 Polyosteoarthritis, unspecified: Secondary | ICD-10-CM | POA: Diagnosis not present

## 2020-02-21 DIAGNOSIS — M25562 Pain in left knee: Secondary | ICD-10-CM | POA: Diagnosis not present

## 2020-02-21 DIAGNOSIS — E559 Vitamin D deficiency, unspecified: Secondary | ICD-10-CM | POA: Diagnosis not present

## 2020-02-21 DIAGNOSIS — M549 Dorsalgia, unspecified: Secondary | ICD-10-CM | POA: Diagnosis not present

## 2020-02-21 DIAGNOSIS — E538 Deficiency of other specified B group vitamins: Secondary | ICD-10-CM | POA: Diagnosis not present

## 2020-02-21 DIAGNOSIS — K439 Ventral hernia without obstruction or gangrene: Secondary | ICD-10-CM | POA: Diagnosis not present

## 2020-02-21 MED FILL — BETAMETHASONE DP AUG 0.05%: 0.05 | 7 days supply | Qty: 15 | Fill #0

## 2020-02-22 DIAGNOSIS — M9903 Segmental and somatic dysfunction of lumbar region: Secondary | ICD-10-CM | POA: Diagnosis not present

## 2020-02-22 DIAGNOSIS — M5136 Other intervertebral disc degeneration, lumbar region: Secondary | ICD-10-CM | POA: Diagnosis not present

## 2020-02-22 DIAGNOSIS — M5442 Lumbago with sciatica, left side: Secondary | ICD-10-CM | POA: Diagnosis not present

## 2020-02-22 DIAGNOSIS — M9902 Segmental and somatic dysfunction of thoracic region: Secondary | ICD-10-CM | POA: Diagnosis not present

## 2020-02-22 DIAGNOSIS — M546 Pain in thoracic spine: Secondary | ICD-10-CM | POA: Diagnosis not present

## 2020-02-29 DIAGNOSIS — K439 Ventral hernia without obstruction or gangrene: Secondary | ICD-10-CM | POA: Diagnosis not present

## 2020-02-29 DIAGNOSIS — M549 Dorsalgia, unspecified: Secondary | ICD-10-CM | POA: Diagnosis not present

## 2020-02-29 DIAGNOSIS — Z0000 Encounter for general adult medical examination without abnormal findings: Secondary | ICD-10-CM | POA: Diagnosis not present

## 2020-02-29 DIAGNOSIS — E785 Hyperlipidemia, unspecified: Secondary | ICD-10-CM | POA: Diagnosis not present

## 2020-02-29 DIAGNOSIS — G8929 Other chronic pain: Secondary | ICD-10-CM | POA: Diagnosis not present

## 2020-02-29 DIAGNOSIS — E559 Vitamin D deficiency, unspecified: Secondary | ICD-10-CM | POA: Diagnosis not present

## 2020-02-29 DIAGNOSIS — I10 Essential (primary) hypertension: Secondary | ICD-10-CM | POA: Diagnosis not present

## 2020-02-29 DIAGNOSIS — E876 Hypokalemia: Secondary | ICD-10-CM | POA: Diagnosis not present

## 2020-02-29 DIAGNOSIS — E538 Deficiency of other specified B group vitamins: Secondary | ICD-10-CM | POA: Diagnosis not present

## 2020-02-29 DIAGNOSIS — G4733 Obstructive sleep apnea (adult) (pediatric): Secondary | ICD-10-CM | POA: Diagnosis not present

## 2020-02-29 DIAGNOSIS — M159 Polyosteoarthritis, unspecified: Secondary | ICD-10-CM | POA: Diagnosis not present

## 2020-03-21 DIAGNOSIS — M9902 Segmental and somatic dysfunction of thoracic region: Secondary | ICD-10-CM | POA: Diagnosis not present

## 2020-03-21 DIAGNOSIS — M5136 Other intervertebral disc degeneration, lumbar region: Secondary | ICD-10-CM | POA: Diagnosis not present

## 2020-03-21 DIAGNOSIS — M5442 Lumbago with sciatica, left side: Secondary | ICD-10-CM | POA: Diagnosis not present

## 2020-03-21 DIAGNOSIS — M546 Pain in thoracic spine: Secondary | ICD-10-CM | POA: Diagnosis not present

## 2020-03-21 DIAGNOSIS — M9903 Segmental and somatic dysfunction of lumbar region: Secondary | ICD-10-CM | POA: Diagnosis not present

## 2020-04-03 DIAGNOSIS — M546 Pain in thoracic spine: Secondary | ICD-10-CM | POA: Diagnosis not present

## 2020-04-03 DIAGNOSIS — M5442 Lumbago with sciatica, left side: Secondary | ICD-10-CM | POA: Diagnosis not present

## 2020-04-03 DIAGNOSIS — M5136 Other intervertebral disc degeneration, lumbar region: Secondary | ICD-10-CM | POA: Diagnosis not present

## 2020-04-03 DIAGNOSIS — M9903 Segmental and somatic dysfunction of lumbar region: Secondary | ICD-10-CM | POA: Diagnosis not present

## 2020-04-03 DIAGNOSIS — M9902 Segmental and somatic dysfunction of thoracic region: Secondary | ICD-10-CM | POA: Diagnosis not present

## 2020-04-04 ENCOUNTER — Ambulatory Visit (INDEPENDENT_AMBULATORY_CARE_PROVIDER_SITE_OTHER): Payer: 59

## 2020-04-04 ENCOUNTER — Other Ambulatory Visit: Payer: Self-pay

## 2020-04-04 ENCOUNTER — Other Ambulatory Visit: Payer: Self-pay | Admitting: Podiatry

## 2020-04-04 ENCOUNTER — Encounter: Payer: Self-pay | Admitting: Podiatry

## 2020-04-04 ENCOUNTER — Ambulatory Visit: Payer: 59 | Admitting: Podiatry

## 2020-04-04 VITALS — Temp 97.2°F

## 2020-04-04 DIAGNOSIS — M722 Plantar fascial fibromatosis: Secondary | ICD-10-CM

## 2020-04-04 MED ORDER — DICLOFENAC SODIUM 75 MG PO TBEC: 75.0000 mg | DELAYED_RELEASE_TABLET | Freq: Two times a day (BID) | ORAL | 2 refills | Status: DC

## 2020-04-04 NOTE — Patient Instructions (Signed)

## 2020-04-05 NOTE — Progress Notes (Signed)
Subjective:   Patient ID: Lisa Everett, female   DOB: 54 y.o.   MRN: [id number]   HPI Patient presents stating she has developed a lot of pain in the bottom of her right heel when walking and its been somewhat constant   ROS      Objective:  Physical Exam  Neurovascular status intact with exquisite discomfort in the plantar aspect right heel at the insertional point of the tendon into the calcaneus     Assessment:  Acute fasciitis right with inflammation fluid buildup     Plan:  H&P reviewed condition and recommended continued stretching exercises anti-inflammatories and support and did sterile prep and reinjected the fascia 3 mg Kenalog 5 mg Xylocaine advised on supportive therapy dispensed fascial brace with instructions on usage and reappoint as symptoms indicate  X-rays indicate moderate depression of the arch with small spur formation no indication of stress fracture

## 2020-04-18 DIAGNOSIS — M9903 Segmental and somatic dysfunction of lumbar region: Secondary | ICD-10-CM | POA: Diagnosis not present

## 2020-04-18 DIAGNOSIS — M9902 Segmental and somatic dysfunction of thoracic region: Secondary | ICD-10-CM | POA: Diagnosis not present

## 2020-04-18 DIAGNOSIS — M5442 Lumbago with sciatica, left side: Secondary | ICD-10-CM | POA: Diagnosis not present

## 2020-04-18 DIAGNOSIS — M5136 Other intervertebral disc degeneration, lumbar region: Secondary | ICD-10-CM | POA: Diagnosis not present

## 2020-04-18 DIAGNOSIS — M546 Pain in thoracic spine: Secondary | ICD-10-CM | POA: Diagnosis not present

## 2020-04-24 DIAGNOSIS — M25562 Pain in left knee: Secondary | ICD-10-CM | POA: Diagnosis not present

## 2020-04-24 DIAGNOSIS — G4733 Obstructive sleep apnea (adult) (pediatric): Secondary | ICD-10-CM | POA: Diagnosis not present

## 2020-04-24 DIAGNOSIS — K439 Ventral hernia without obstruction or gangrene: Secondary | ICD-10-CM | POA: Diagnosis not present

## 2020-04-24 DIAGNOSIS — M159 Polyosteoarthritis, unspecified: Secondary | ICD-10-CM | POA: Diagnosis not present

## 2020-04-24 DIAGNOSIS — G8929 Other chronic pain: Secondary | ICD-10-CM | POA: Diagnosis not present

## 2020-04-24 DIAGNOSIS — E559 Vitamin D deficiency, unspecified: Secondary | ICD-10-CM | POA: Diagnosis not present

## 2020-04-24 DIAGNOSIS — M549 Dorsalgia, unspecified: Secondary | ICD-10-CM | POA: Diagnosis not present

## 2020-04-24 DIAGNOSIS — E538 Deficiency of other specified B group vitamins: Secondary | ICD-10-CM | POA: Diagnosis not present

## 2020-04-24 DIAGNOSIS — E876 Hypokalemia: Secondary | ICD-10-CM | POA: Diagnosis not present

## 2020-04-26 ENCOUNTER — Ambulatory Visit: Payer: 59 | Admitting: Podiatry

## 2020-04-26 ENCOUNTER — Other Ambulatory Visit: Payer: Self-pay

## 2020-04-26 ENCOUNTER — Encounter: Payer: Self-pay | Admitting: Podiatry

## 2020-04-26 VITALS — Temp 97.3°F

## 2020-04-26 DIAGNOSIS — M722 Plantar fascial fibromatosis: Secondary | ICD-10-CM

## 2020-04-27 NOTE — Progress Notes (Signed)
Subjective:   Patient ID: Lisa Everett, female   DOB: 54 y.o.   MRN: [id number]   HPI Patient presents stating she is doing better but she has 1 spot on the bottom of her foot that is quite sore   ROS      Objective:  Physical Exam  Neurovascular status intact with improvement of the medial fascial band of the lateral plantar fascial band found to be inflamed and sore     Assessment:  Lateral band plantar fasciitis right     Plan:  Today I did a lateral sterile prep injected around the lateral fascial band 3 mg Kenalog 5 mg liken advised on reduced activity and reappoint as needed

## 2020-05-17 MED FILL — VIT D2 1.25 MG (50,000 UNIT: 1.25 MG | 55 days supply | Qty: 8 | Fill #2

## 2020-05-17 MED FILL — MELOXICAM 15 MG TABLET: 15 | 90 days supply | Qty: 90 | Fill #1

## 2020-05-31 DIAGNOSIS — M5136 Other intervertebral disc degeneration, lumbar region: Secondary | ICD-10-CM | POA: Diagnosis not present

## 2020-05-31 DIAGNOSIS — M9903 Segmental and somatic dysfunction of lumbar region: Secondary | ICD-10-CM | POA: Diagnosis not present

## 2020-05-31 DIAGNOSIS — M9902 Segmental and somatic dysfunction of thoracic region: Secondary | ICD-10-CM | POA: Diagnosis not present

## 2020-05-31 DIAGNOSIS — M5442 Lumbago with sciatica, left side: Secondary | ICD-10-CM | POA: Diagnosis not present

## 2020-05-31 DIAGNOSIS — M546 Pain in thoracic spine: Secondary | ICD-10-CM | POA: Diagnosis not present

## 2020-06-05 DIAGNOSIS — K439 Ventral hernia without obstruction or gangrene: Secondary | ICD-10-CM | POA: Diagnosis not present

## 2020-06-05 DIAGNOSIS — E559 Vitamin D deficiency, unspecified: Secondary | ICD-10-CM | POA: Diagnosis not present

## 2020-06-05 DIAGNOSIS — M549 Dorsalgia, unspecified: Secondary | ICD-10-CM | POA: Diagnosis not present

## 2020-06-05 DIAGNOSIS — G8929 Other chronic pain: Secondary | ICD-10-CM | POA: Diagnosis not present

## 2020-06-05 DIAGNOSIS — E538 Deficiency of other specified B group vitamins: Secondary | ICD-10-CM | POA: Diagnosis not present

## 2020-06-05 DIAGNOSIS — I10 Essential (primary) hypertension: Secondary | ICD-10-CM | POA: Diagnosis not present

## 2020-06-05 DIAGNOSIS — Z1331 Encounter for screening for depression: Secondary | ICD-10-CM | POA: Diagnosis not present

## 2020-06-05 DIAGNOSIS — E876 Hypokalemia: Secondary | ICD-10-CM | POA: Diagnosis not present

## 2020-06-05 DIAGNOSIS — M159 Polyosteoarthritis, unspecified: Secondary | ICD-10-CM | POA: Diagnosis not present

## 2020-06-05 DIAGNOSIS — G4733 Obstructive sleep apnea (adult) (pediatric): Secondary | ICD-10-CM | POA: Diagnosis not present

## 2020-06-05 DIAGNOSIS — E785 Hyperlipidemia, unspecified: Secondary | ICD-10-CM | POA: Diagnosis not present

## 2020-06-11 DIAGNOSIS — M549 Dorsalgia, unspecified: Secondary | ICD-10-CM | POA: Diagnosis not present

## 2020-06-11 DIAGNOSIS — K439 Ventral hernia without obstruction or gangrene: Secondary | ICD-10-CM | POA: Diagnosis not present

## 2020-06-11 DIAGNOSIS — E559 Vitamin D deficiency, unspecified: Secondary | ICD-10-CM | POA: Diagnosis not present

## 2020-06-11 DIAGNOSIS — E785 Hyperlipidemia, unspecified: Secondary | ICD-10-CM | POA: Diagnosis not present

## 2020-06-11 DIAGNOSIS — I10 Essential (primary) hypertension: Secondary | ICD-10-CM | POA: Diagnosis not present

## 2020-06-11 DIAGNOSIS — M159 Polyosteoarthritis, unspecified: Secondary | ICD-10-CM | POA: Diagnosis not present

## 2020-06-11 DIAGNOSIS — E538 Deficiency of other specified B group vitamins: Secondary | ICD-10-CM | POA: Diagnosis not present

## 2020-06-11 DIAGNOSIS — G8929 Other chronic pain: Secondary | ICD-10-CM | POA: Diagnosis not present

## 2020-06-11 DIAGNOSIS — E876 Hypokalemia: Secondary | ICD-10-CM | POA: Diagnosis not present

## 2020-06-18 ENCOUNTER — Other Ambulatory Visit: Payer: Self-pay | Admitting: Internal Medicine

## 2020-06-18 DIAGNOSIS — Z1231 Encounter for screening mammogram for malignant neoplasm of breast: Secondary | ICD-10-CM

## 2020-06-22 ENCOUNTER — Other Ambulatory Visit (HOSPITAL_COMMUNITY): Payer: Self-pay | Admitting: Internal Medicine

## 2020-06-29 MED FILL — DICLOFENAC SODIUM 75 MG TAB: 75 | 25 days supply | Qty: 50 | Fill #1

## 2020-07-06 MED FILL — VIT D2 1.25 MG (50,000 UNIT: 1.25 MG | 84 days supply | Qty: 12 | Fill #0

## 2020-07-20 DIAGNOSIS — M25562 Pain in left knee: Secondary | ICD-10-CM | POA: Diagnosis not present

## 2020-07-20 DIAGNOSIS — K439 Ventral hernia without obstruction or gangrene: Secondary | ICD-10-CM | POA: Diagnosis not present

## 2020-07-20 DIAGNOSIS — M549 Dorsalgia, unspecified: Secondary | ICD-10-CM | POA: Diagnosis not present

## 2020-07-20 DIAGNOSIS — G4733 Obstructive sleep apnea (adult) (pediatric): Secondary | ICD-10-CM | POA: Diagnosis not present

## 2020-07-20 DIAGNOSIS — G8929 Other chronic pain: Secondary | ICD-10-CM | POA: Diagnosis not present

## 2020-07-20 DIAGNOSIS — E559 Vitamin D deficiency, unspecified: Secondary | ICD-10-CM | POA: Diagnosis not present

## 2020-07-20 DIAGNOSIS — M159 Polyosteoarthritis, unspecified: Secondary | ICD-10-CM | POA: Diagnosis not present

## 2020-07-20 DIAGNOSIS — E538 Deficiency of other specified B group vitamins: Secondary | ICD-10-CM | POA: Diagnosis not present

## 2020-07-20 DIAGNOSIS — E876 Hypokalemia: Secondary | ICD-10-CM | POA: Diagnosis not present

## 2020-07-27 ENCOUNTER — Other Ambulatory Visit: Payer: Self-pay

## 2020-07-27 ENCOUNTER — Ambulatory Visit
Admission: RE | Admit: 2020-07-27 | Discharge: 2020-07-27 | Disposition: A | Discharge status: Home / Self Care | Payer: 59 | Source: Ambulatory Visit | Attending: Internal Medicine | Admitting: Internal Medicine

## 2020-07-27 DIAGNOSIS — Z1231 Encounter for screening mammogram for malignant neoplasm of breast: Secondary | ICD-10-CM | POA: Diagnosis not present

## 2020-08-03 DIAGNOSIS — E876 Hypokalemia: Secondary | ICD-10-CM | POA: Diagnosis not present

## 2020-08-03 DIAGNOSIS — E785 Hyperlipidemia, unspecified: Secondary | ICD-10-CM | POA: Diagnosis not present

## 2020-08-03 DIAGNOSIS — G4733 Obstructive sleep apnea (adult) (pediatric): Secondary | ICD-10-CM | POA: Diagnosis not present

## 2020-08-03 DIAGNOSIS — J45909 Unspecified asthma, uncomplicated: Secondary | ICD-10-CM | POA: Diagnosis not present

## 2020-08-03 DIAGNOSIS — M25562 Pain in left knee: Secondary | ICD-10-CM | POA: Diagnosis not present

## 2020-08-03 DIAGNOSIS — K439 Ventral hernia without obstruction or gangrene: Secondary | ICD-10-CM | POA: Diagnosis not present

## 2020-08-03 DIAGNOSIS — I10 Essential (primary) hypertension: Secondary | ICD-10-CM | POA: Diagnosis not present

## 2020-08-03 DIAGNOSIS — E538 Deficiency of other specified B group vitamins: Secondary | ICD-10-CM | POA: Diagnosis not present

## 2020-08-23 DIAGNOSIS — M5136 Other intervertebral disc degeneration, lumbar region: Secondary | ICD-10-CM | POA: Diagnosis not present

## 2020-08-23 DIAGNOSIS — G8929 Other chronic pain: Secondary | ICD-10-CM | POA: Diagnosis not present

## 2020-08-23 DIAGNOSIS — M159 Polyosteoarthritis, unspecified: Secondary | ICD-10-CM | POA: Diagnosis not present

## 2020-08-23 DIAGNOSIS — J45909 Unspecified asthma, uncomplicated: Secondary | ICD-10-CM | POA: Diagnosis not present

## 2020-08-23 DIAGNOSIS — E876 Hypokalemia: Secondary | ICD-10-CM | POA: Diagnosis not present

## 2020-08-23 DIAGNOSIS — E538 Deficiency of other specified B group vitamins: Secondary | ICD-10-CM | POA: Diagnosis not present

## 2020-08-23 DIAGNOSIS — K439 Ventral hernia without obstruction or gangrene: Secondary | ICD-10-CM | POA: Diagnosis not present

## 2020-08-23 DIAGNOSIS — M549 Dorsalgia, unspecified: Secondary | ICD-10-CM | POA: Diagnosis not present

## 2020-08-23 DIAGNOSIS — G4733 Obstructive sleep apnea (adult) (pediatric): Secondary | ICD-10-CM | POA: Diagnosis not present

## 2020-08-23 DIAGNOSIS — M546 Pain in thoracic spine: Secondary | ICD-10-CM | POA: Diagnosis not present

## 2020-08-23 DIAGNOSIS — M5442 Lumbago with sciatica, left side: Secondary | ICD-10-CM | POA: Diagnosis not present

## 2020-08-23 DIAGNOSIS — M9903 Segmental and somatic dysfunction of lumbar region: Secondary | ICD-10-CM | POA: Diagnosis not present

## 2020-08-23 DIAGNOSIS — M9902 Segmental and somatic dysfunction of thoracic region: Secondary | ICD-10-CM | POA: Diagnosis not present

## 2020-08-23 DIAGNOSIS — E559 Vitamin D deficiency, unspecified: Secondary | ICD-10-CM | POA: Diagnosis not present

## 2020-08-30 ENCOUNTER — Other Ambulatory Visit (HOSPITAL_COMMUNITY): Payer: Self-pay | Admitting: Internal Medicine

## 2020-08-30 MED FILL — LISINOPRIL-HCTZ 20-12.5 MG: 20-12.5 | 90 days supply | Qty: 90 | Fill #0

## 2020-08-30 MED FILL — DICLOFENAC SODIUM 75 MG TAB: 75 | 25 days supply | Qty: 50 | Fill #2

## 2020-09-05 DIAGNOSIS — E785 Hyperlipidemia, unspecified: Secondary | ICD-10-CM | POA: Diagnosis not present

## 2020-09-05 DIAGNOSIS — M9903 Segmental and somatic dysfunction of lumbar region: Secondary | ICD-10-CM | POA: Diagnosis not present

## 2020-09-05 DIAGNOSIS — E876 Hypokalemia: Secondary | ICD-10-CM | POA: Diagnosis not present

## 2020-09-05 DIAGNOSIS — I10 Essential (primary) hypertension: Secondary | ICD-10-CM | POA: Diagnosis not present

## 2020-09-05 DIAGNOSIS — M546 Pain in thoracic spine: Secondary | ICD-10-CM | POA: Diagnosis not present

## 2020-09-05 DIAGNOSIS — G8929 Other chronic pain: Secondary | ICD-10-CM | POA: Diagnosis not present

## 2020-09-05 DIAGNOSIS — J45909 Unspecified asthma, uncomplicated: Secondary | ICD-10-CM | POA: Diagnosis not present

## 2020-09-05 DIAGNOSIS — G4733 Obstructive sleep apnea (adult) (pediatric): Secondary | ICD-10-CM | POA: Diagnosis not present

## 2020-09-05 DIAGNOSIS — M9902 Segmental and somatic dysfunction of thoracic region: Secondary | ICD-10-CM | POA: Diagnosis not present

## 2020-09-05 DIAGNOSIS — E538 Deficiency of other specified B group vitamins: Secondary | ICD-10-CM | POA: Diagnosis not present

## 2020-09-05 DIAGNOSIS — M159 Polyosteoarthritis, unspecified: Secondary | ICD-10-CM | POA: Diagnosis not present

## 2020-09-05 DIAGNOSIS — E559 Vitamin D deficiency, unspecified: Secondary | ICD-10-CM | POA: Diagnosis not present

## 2020-09-05 DIAGNOSIS — K439 Ventral hernia without obstruction or gangrene: Secondary | ICD-10-CM | POA: Diagnosis not present

## 2020-09-05 DIAGNOSIS — M549 Dorsalgia, unspecified: Secondary | ICD-10-CM | POA: Diagnosis not present

## 2020-09-05 DIAGNOSIS — M5136 Other intervertebral disc degeneration, lumbar region: Secondary | ICD-10-CM | POA: Diagnosis not present

## 2020-09-05 DIAGNOSIS — M5442 Lumbago with sciatica, left side: Secondary | ICD-10-CM | POA: Diagnosis not present

## 2020-09-10 DIAGNOSIS — H25813 Combined forms of age-related cataract, bilateral: Secondary | ICD-10-CM | POA: Diagnosis not present

## 2020-09-10 DIAGNOSIS — H40013 Open angle with borderline findings, low risk, bilateral: Secondary | ICD-10-CM | POA: Diagnosis not present

## 2020-09-17 MED FILL — VIT D2 1.25 MG (50,000 UNIT: 1.25 MG | 84 days supply | Qty: 12 | Fill #1

## 2021-02-06 IMAGING — MG MM DIGITAL DIAGNOSTIC UNILAT*L* W/ TOMO
4 series · 4 of 12 positions shown · non-contrast
Comparison: 07/21/2019 and earlier

CLINICAL DATA: The patient returns after screening study for
evaluation of a mass.

EXAM:
DIGITAL DIAGNOSTIC LEFT MAMMOGRAM WITH CAD AND TOMO
ULTRASOUND LEFT BREAST

[L MLO synth-2D]
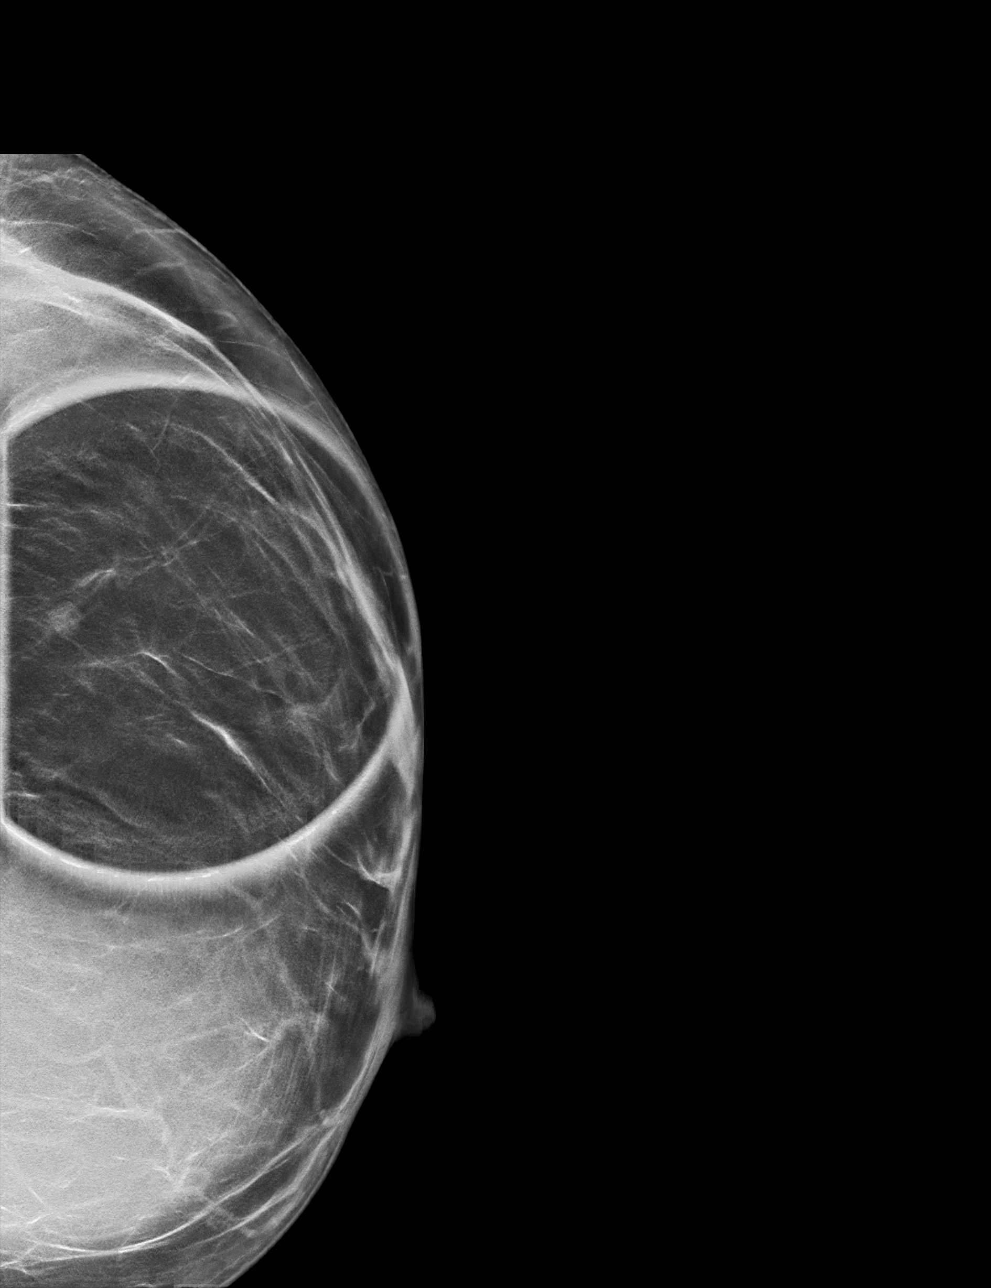

[L CC synth-2D]
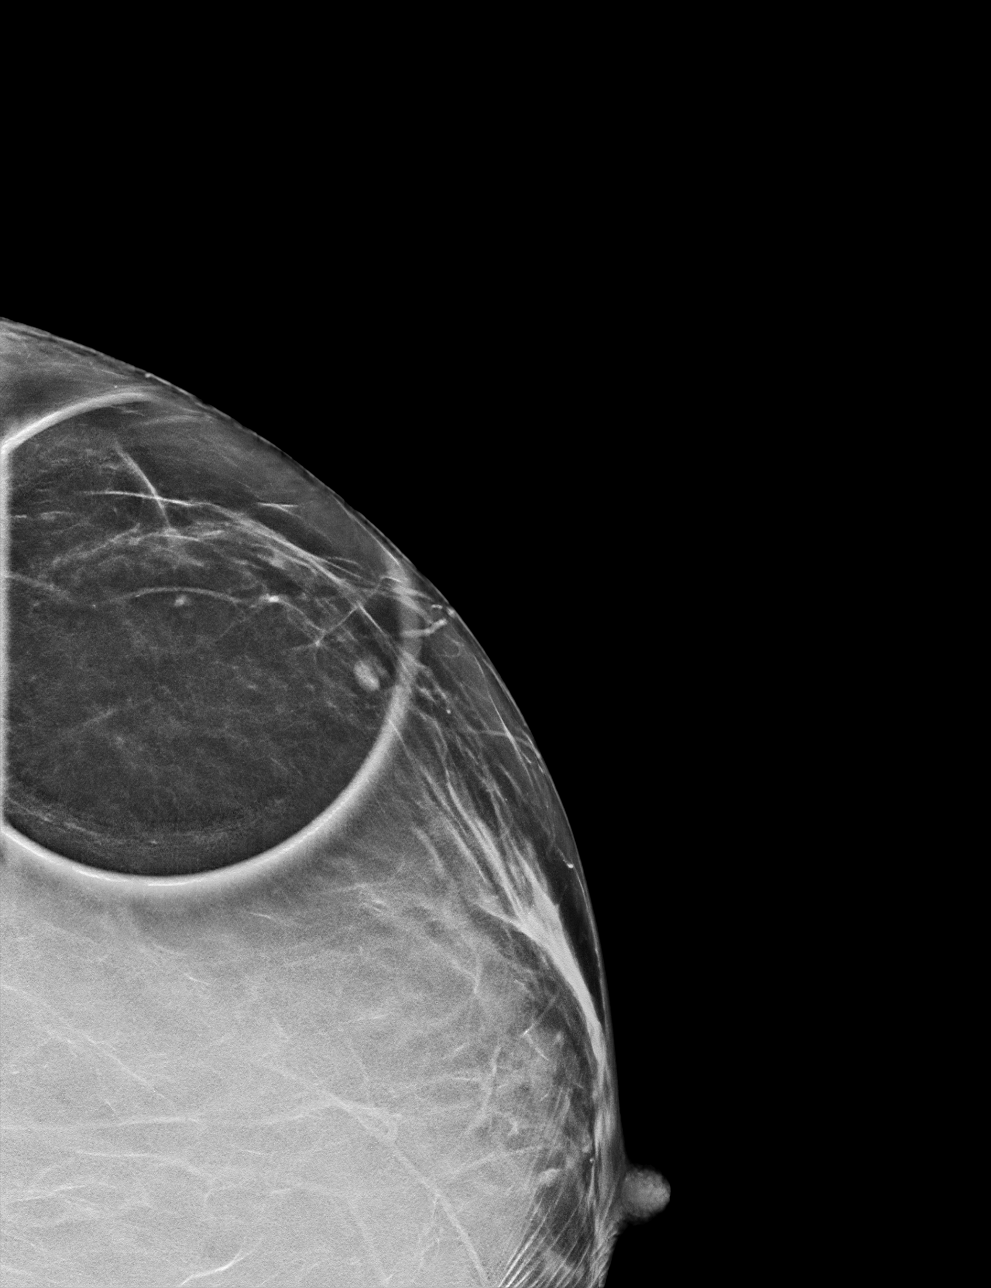

[L CC tomo · tomo slice 31/62.0]
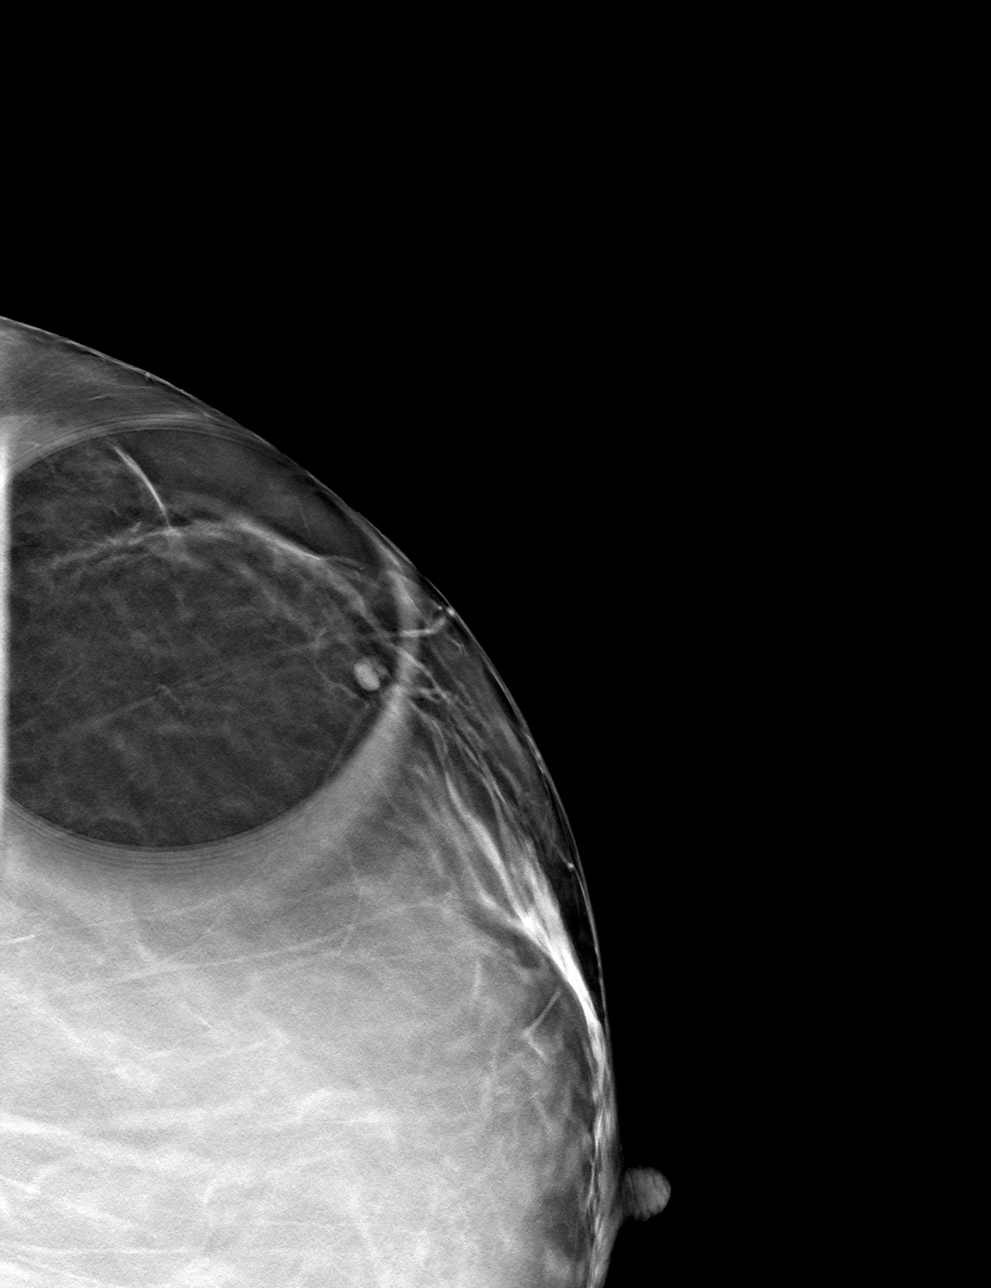

[L MLO tomo · tomo slice 37/72.0]
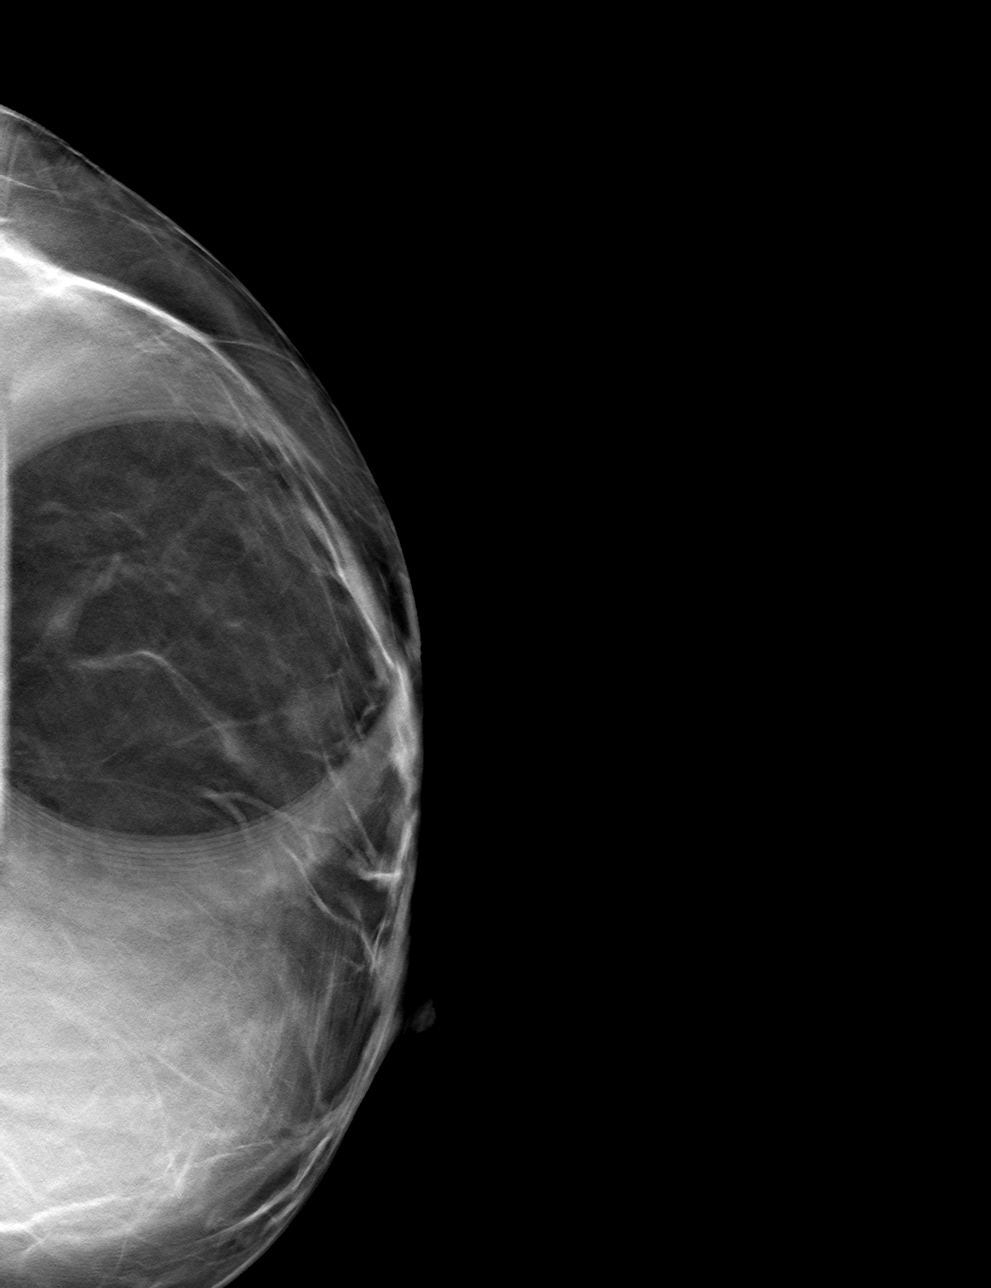

[4 of 12 positions shown; findings below may reference images not displayed]

ACR Breast Density Category b: There are scattered areas of
fibroglandular density.
FINDINGS: Additional 2-D and 3-D images are performed. These views confirm
presence of a circumscribed oval mass in the UPPER-OUTER LEFT.

Mammographic images were processed with CAD.

Targeted ultrasound is performed, showing a group of microcysts in
the 2 o'clock location of the LEFT breast 8 centimeters from the
nipple which measures 0.5 x 0.6 x 0.4 centimeters. No solid
components or areas of acoustic shadowing.
IMPRESSION: Persistent abnormality is a group of microcysts by ultrasound.
Follow-up is recommended to document stability. No mammographic or
ultrasound evidence for malignancy.

RECOMMENDATION:
Recommend LEFT breast ultrasound in 6 months.

I have discussed the findings and recommendations with the patient.
If applicable, a reminder letter will be sent to the patient
regarding the next appointment.

BI-RADS CATEGORY  3: Probably benign.

## 2021-02-20 ENCOUNTER — Encounter: Payer: Self-pay | Admitting: Podiatry

## 2021-02-20 ENCOUNTER — Ambulatory Visit (INDEPENDENT_AMBULATORY_CARE_PROVIDER_SITE_OTHER): Payer: 59 | Admitting: Podiatry

## 2021-02-20 ENCOUNTER — Other Ambulatory Visit: Payer: Self-pay

## 2021-02-20 DIAGNOSIS — M722 Plantar fascial fibromatosis: Secondary | ICD-10-CM | POA: Diagnosis not present

## 2021-02-20 MED ORDER — TRIAMCINOLONE ACETONIDE 10 MG/ML IJ SUSP
10.0000 mg | Freq: Once | INTRAMUSCULAR | Status: AC
  Administered 2021-02-20: 10 mg

## 2021-02-22 NOTE — Progress Notes (Signed)
Subjective:   Patient ID: Lisa Everett, female   DOB: 55 y.o.   MRN: [id number]   HPI Patient states she had a reoccurrence of pain in the right plantar fascia and she did quite well for this for a period of time   ROS      Objective:  Physical Exam  Neurovascular status intact with discomfort in the plantar medial heel right at the insertional point tendon calcaneus that did well for a period of time     Assessment:  Acute [place] fasciitis right     Plan:  Sterile prep injected the fascia 3 mg Kenalog 5 mg Xylocaine advised on stretching exercises supportive shoes ice therapy and reappoint as symptoms indicate

## 2021-06-25 ENCOUNTER — Other Ambulatory Visit: Payer: Self-pay | Admitting: Internal Medicine

## 2021-06-25 DIAGNOSIS — Z1231 Encounter for screening mammogram for malignant neoplasm of breast: Secondary | ICD-10-CM

## 2021-08-10 IMAGING — US US BREAST*L* LIMITED INC AXILLA
1 series · 4 of 4 positions shown · non-contrast
Comparison: Previous exam(s).

CLINICAL DATA: Six-month interval follow-up of likely benign
clustered cysts involving UPPER OUTER QUADRANT of the LEFT breast.

EXAM:
ULTRASOUND OF THE LEFT BREAST

[Series 1: us breast*left* limited inc axilla · 0.06mm/px · 4 of 4 slices shown]
[im 1/4]
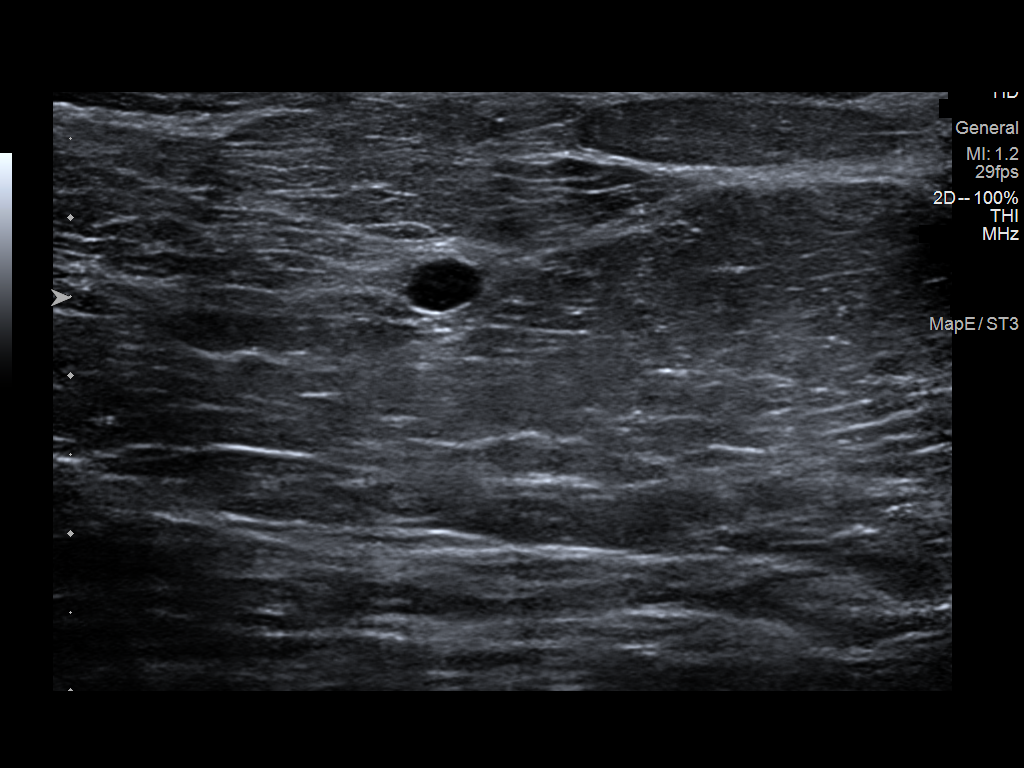
[im 2/4]
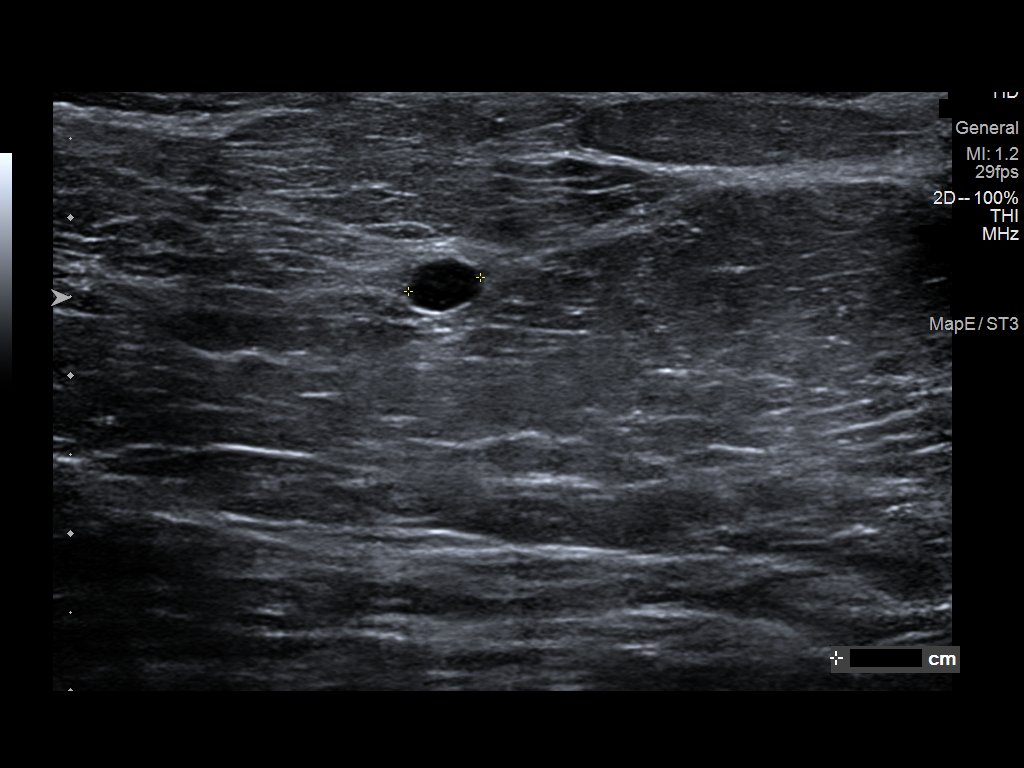
[im 3/4]
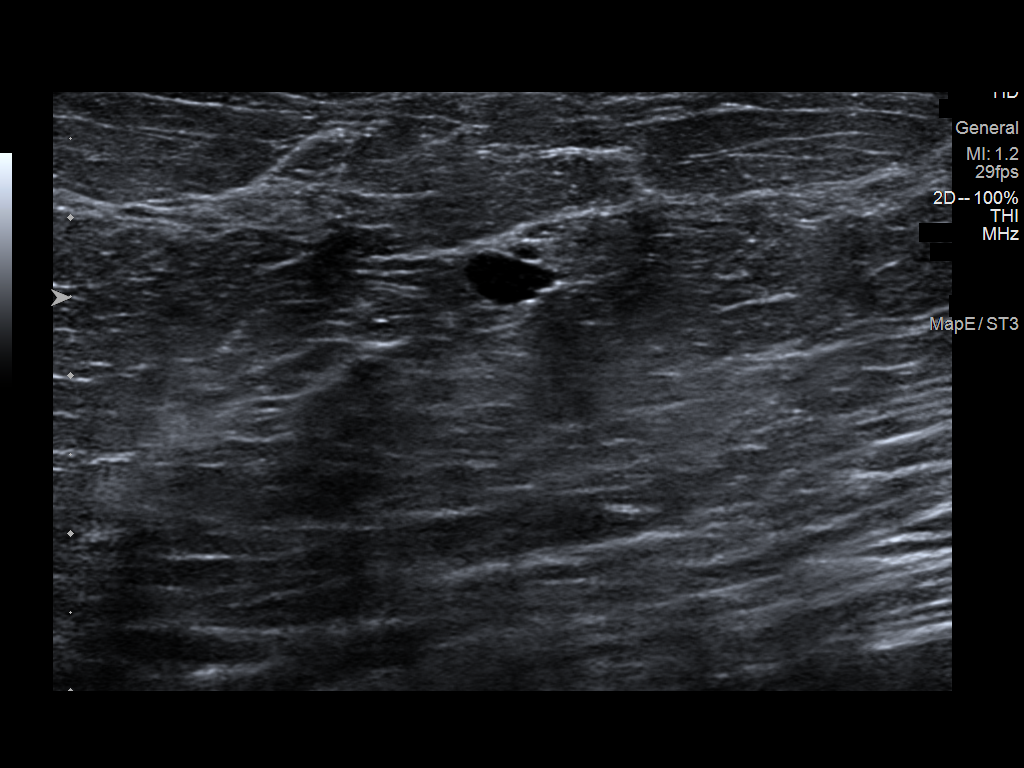
[im 4/4]
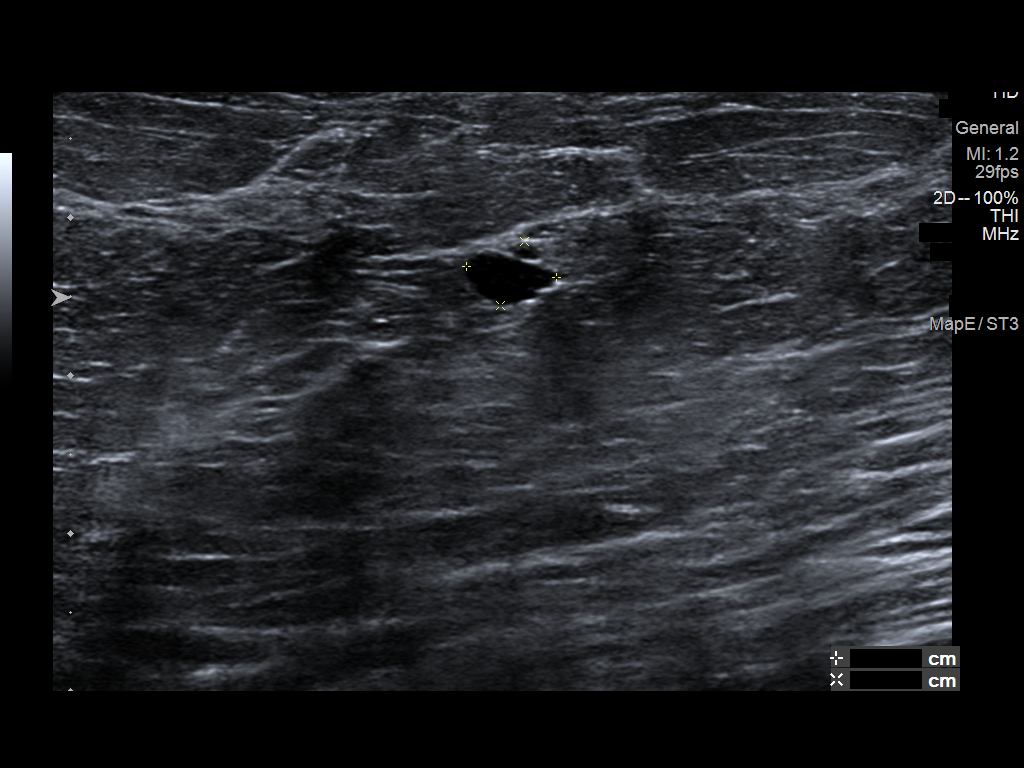

[4 of 4 positions shown; findings below may reference images not displayed]

FINDINGS: Targeted ultrasound is performed, showing the previously identified
adjacent benign appearing cysts at the 2 o'clock position
approximately 8 cm from the nipple measuring total approximately 6 x
4 x 5 mm, unchanged in size but with fewer cysts in the cluster
currently. There is no suspicious solid component.
IMPRESSION: Benign clustered cysts in the UPPER OUTER QUADRANT of LEFT breast.

RECOMMENDATION:
Annual BILATERAL screening mammography which is due in 6 months.

I have discussed the findings and recommendations with the patient.
If applicable, a reminder letter will be sent to the patient
regarding the next appointment.

BI-RADS CATEGORY  2: Benign.

## 2021-08-28 ENCOUNTER — Ambulatory Visit
Admission: RE | Admit: 2021-08-28 | Discharge: 2021-08-28 | Disposition: A | Discharge status: Home / Self Care | Payer: 59 | Source: Ambulatory Visit | Attending: Internal Medicine | Admitting: Internal Medicine

## 2021-08-28 ENCOUNTER — Other Ambulatory Visit: Payer: Self-pay

## 2021-08-28 DIAGNOSIS — Z1231 Encounter for screening mammogram for malignant neoplasm of breast: Secondary | ICD-10-CM

## 2022-07-31 ENCOUNTER — Other Ambulatory Visit: Payer: Self-pay | Admitting: Internal Medicine

## 2022-07-31 DIAGNOSIS — Z1231 Encounter for screening mammogram for malignant neoplasm of breast: Secondary | ICD-10-CM

## 2022-09-08 ENCOUNTER — Ambulatory Visit
Admission: RE | Admit: 2022-09-08 | Discharge: 2022-09-08 | Disposition: A | Discharge status: Home / Self Care | Payer: 59 | Source: Ambulatory Visit | Attending: Internal Medicine | Admitting: Internal Medicine

## 2022-09-08 DIAGNOSIS — Z1231 Encounter for screening mammogram for malignant neoplasm of breast: Secondary | ICD-10-CM

## 2022-11-04 ENCOUNTER — Other Ambulatory Visit: Payer: Self-pay

## 2022-11-04 ENCOUNTER — Encounter: Payer: Self-pay | Admitting: Physical Therapy

## 2022-11-04 ENCOUNTER — Ambulatory Visit: Payer: 59 | Attending: Orthopedic Surgery | Admitting: Physical Therapy

## 2022-11-04 DIAGNOSIS — M6281 Muscle weakness (generalized): Secondary | ICD-10-CM | POA: Insufficient documentation

## 2022-11-04 DIAGNOSIS — G8929 Other chronic pain: Secondary | ICD-10-CM | POA: Insufficient documentation

## 2022-11-04 DIAGNOSIS — M25562 Pain in left knee: Secondary | ICD-10-CM | POA: Insufficient documentation

## 2022-11-04 DIAGNOSIS — R2689 Other abnormalities of gait and mobility: Secondary | ICD-10-CM | POA: Insufficient documentation

## 2022-11-04 DIAGNOSIS — M25561 Pain in right knee: Secondary | ICD-10-CM | POA: Insufficient documentation

## 2022-11-04 NOTE — Therapy (Signed)
OUTPATIENT PHYSICAL THERAPY LOWER EXTREMITY EVALUATION   Patient Name: Lisa Everett MRN: [id number] DOB:1966/09/09, 57 y.o., female Today's Date: 11/04/2022  END OF SESSION:  PT End of Session - 11/04/22 1317     Visit Number 1    Number of Visits 16    Date for PT Re-Evaluation 12/30/22    Authorization Type [employer]    PT Start Time 1317    PT Stop Time 1400    PT Time Calculation (min) 43 min    Activity Tolerance Patient tolerated treatment well    Behavior During Therapy WFL for tasks assessed/performed             Past Medical History:  Diagnosis Date   Allergy    pet dander   Asthma    Family history of adverse reaction to anesthesia    Sister has naseau and vomiting   Hot flashes    Hypertension    Hypokalemia    Vitamin D deficiency    Past Surgical History:  Procedure Laterality Date   CESAREAN SECTION     HERNIA REPAIR     x3 3rd with Dr. Sheliah Hatch 07-02-18   INCISIONAL HERNIA REPAIR N/A 07/02/2018   Procedure: HERNIA REPAIR INCISIONAL OPEN RECURRENT;  Surgeon: Kinsinger, De Blanch, MD;  Location: [facility];  Service: General;  Laterality: N/A;   INSERTION OF MESH N/A 07/02/2018   Procedure: INSERTION OF MESH;  Surgeon: Sheliah Hatch De Blanch, MD;  Location: [facility];  Service: General;  Laterality: N/A;   LAPAROTOMY N/A 07/03/2018   Procedure: EXPLORATORY LAPAROTOMY, RE-OPENING OF INCISION AND REMOVAL OF HEMATOMA;  Surgeon: Sheliah Hatch, De Blanch, MD;  Location: [facility];  Service: General;  Laterality: N/A;   WISDOM TOOTH EXTRACTION     Patient Active Problem List   Diagnosis Date Noted   Incisional hernia 07/02/2018    PCP: n/a  REFERRING PROVIDER: Ollen Gross, MD  REFERRING DIAG: M25.562 (ICD-10-CM) - Pain in left knee  THERAPY DIAG:  No diagnosis found.  Rationale for Evaluation and Treatment: Rehabilitation  ONSET DATE: Years  SUBJECTIVE:   SUBJECTIVE STATEMENT: Pt reports she needs to get knee surgery. Plans to get both knees done but  concentrate on L knee. Surgeon would like her to get her quads strengthened. No timeline as of yet. Pt reports knees do get swollen at times  PERTINENT HISTORY: Chronic knee OA  PAIN:  Are you having pain? Yes: NPRS scale: 1 or 2 currently, at worst 10/10 Pain location: Medial knee Pain description: Dull ache Aggravating factors: Cold weather, rain, being on my feet a lot, stairs Relieving factors: Heat/ice, pain medication  PRECAUTIONS: None  WEIGHT BEARING RESTRICTIONS: No  FALLS:  Has patient fallen in last 6 months? No  LIVING ENVIRONMENT: Lives with: lives with their family Lives in: House/apartment Stairs: Yes: Internal: 20 steps; on right going up Has following equipment at home: None  OCCUPATION: X-ray tech  PLOF: Independent  PATIENT GOALS: Improve strength to ready for surgery  NEXT MD VISIT:   OBJECTIVE:   DIAGNOSTIC FINDINGS: None noted in chart  PATIENT SURVEYS:  FOTO TBA  COGNITION: Overall cognitive status: Within functional limits for tasks assessed     SENSATION: WFL  EDEMA:  Occasionally  MUSCLE LENGTH: Hamstrings: Right 60 deg; Left 60 deg  POSTURE: increased lumbar lordosis  PALPATION: TTP medial knee  LOWER EXTREMITY ROM:  ROM Right eval Left eval  Hip flexion    Hip extension    Hip abduction  Hip adduction    Hip internal rotation    Hip external rotation    Knee flexion 128 A/PROM 120 A/PROM  Knee extension -10 AROM 0 PROM -34 AROM 0 PROM  Ankle dorsiflexion    Ankle plantarflexion    Ankle inversion    Ankle eversion     (Blank rows = not tested)  LOWER EXTREMITY MMT:  MMT Right eval Left eval  Hip flexion 4- 4-  Hip extension 3- 3-  Hip abduction 3 3-  Hip adduction    Hip internal rotation    Hip external rotation    Knee flexion 4 4  Knee extension 3+ 3+  Ankle dorsiflexion    Ankle plantarflexion    Ankle inversion    Ankle eversion     (Blank rows = not tested)   FUNCTIONAL TESTS:  5  times sit to stand: 17.79 sec with UE support Tandem stance 30 sec bilat but with increased sway and hip strategy SLS: 10 sec L, 3 sec R  GAIT: Distance walked: 76' Assistive device utilized: None Level of assistance: Complete Independence Comments: Slightly flexed trunk, wide BOS   TODAY'S TREATMENT:                                                                                                                              DATE: 11/04/22  See HEP   PATIENT EDUCATION:  Education details: Exam findings, POC, initial HEP Person educated: Patient Education method: Explanation, Demonstration, and Handouts Education comprehension: verbalized understanding, returned demonstration, and needs further education  HOME EXERCISE PROGRAM: Access Code: [email or web address] URL: [email or web address] Date: 11/04/2022 Prepared by: Estill Bamberg April Thurnell Garbe  Exercises - Supine Quadricep Sets  - 1 x daily - 7 x weekly - 2 sets - 10 reps - 3 sec hold - Supine Active Straight Leg Raise  - 1 x daily - 7 x weekly - 2 sets - 10 reps - Supine Heel Slide  - 1 x daily - 7 x weekly - 2 sets - 10 reps - Hooklying Isometric Clamshell  - 1 x daily - 7 x weekly - 2 sets - 10 reps - Supine Bridge  - 1 x daily - 7 x weekly - 2 sets - 10 reps  ASSESSMENT:  CLINICAL IMPRESSION: Patient is a 58 y.o. F who was seen today for physical therapy evaluation and treatment for bilat knee pain (L worse than R). Pt with plans to get knee replacement but surgeon is recommending some strengthening. Assessment significant for bilat LE weakness (especially with quads and hip abductors), decreased ROM, and pain affecting functional mobility, t/fs, and amb. Pt would benefit from PT to address these issues and maximize level of function.  OBJECTIVE IMPAIRMENTS: Abnormal gait, decreased activity tolerance, decreased balance, decreased endurance, decreased mobility, difficulty walking, decreased ROM, decreased strength, impaired  flexibility, and pain.   ACTIVITY LIMITATIONS: lifting, squatting, stairs, transfers, and locomotion level  PARTICIPATION LIMITATIONS: cleaning, laundry, driving, shopping,  community activity, and occupation  PERSONAL FACTORS: Fitness, Past/current experiences, and Time since onset of injury/illness/exacerbation are also affecting patient's functional outcome.   REHAB POTENTIAL: Good  CLINICAL DECISION MAKING: Stable/uncomplicated  EVALUATION COMPLEXITY: Low  GOALS: Goals reviewed with patient? Yes  SHORT TERM GOALS: Target date: 12/02/2022   Patient will be independent with initial HEP. Baseline:  Goal status: INITIAL   LONG TERM GOALS: Target date: 12/30/2022  Patient will be independent with advanced/ongoing HEP to improve outcomes and carryover.  Baseline:  Goal status: INITIAL  2.  Patient will report at least 50% improvement in bilat knee pain to improve QOL. Baseline:  Goal status: INITIAL  3.  Patient will be able to perform SLS x 20 sec to demo increased LE stability and endurance. Baseline:  Goal status: INITIAL  4.  Patient will demonstrate improved functional LE strength as demonstrated by 5x STS </=13 sec without UE support. Baseline:  Goal status: INITIAL  5.  Patient will be able to ambulate 1000' with LRAD and normal gait pattern without increased pain to access community.  Baseline:  Goal status: INITIAL  6. Patient will be able to ascend/descend stairs with 1 HR and reciprocal step pattern safely to access home and community.  Baseline:  Goal status: INITIAL    PLAN:  PT FREQUENCY: 2x/week with plans to decrease to 1x/wk  PT DURATION: 8 weeks  PLANNED INTERVENTIONS: Therapeutic exercises, Therapeutic activity, Neuromuscular re-education, Balance training, Gait training, Patient/Family education, Self Care, Joint mobilization, Stair training, Aquatic Therapy, Dry Needling, Electrical stimulation, Cryotherapy, Moist heat, Taping, Vasopneumatic  device, Ionotophoresis 4mg /ml Dexamethasone, Manual therapy, and Re-evaluation  PLAN FOR NEXT SESSION: Assess response to HEP. Progress as able. Gentle knee AROM.    Nazareth Kirk April Ma L Bryndan Bilyk, PT 11/04/2022, 2:00 PM

## 2022-11-06 ENCOUNTER — Ambulatory Visit: Payer: 59

## 2022-11-06 DIAGNOSIS — M6281 Muscle weakness (generalized): Secondary | ICD-10-CM

## 2022-11-06 DIAGNOSIS — G8929 Other chronic pain: Secondary | ICD-10-CM | POA: Diagnosis not present

## 2022-11-06 DIAGNOSIS — R2689 Other abnormalities of gait and mobility: Secondary | ICD-10-CM

## 2022-11-06 NOTE — Therapy (Signed)
OUTPATIENT PHYSICAL THERAPY LOWER EXTREMITY TREATMENT   Patient Name: Lisa Everett MRN: [id number] DOB:August 17, 1966, 57 y.o., female Today's Date: 11/06/2022  END OF SESSION:  PT End of Session - 11/06/22 1314     Visit Number 2    Number of Visits 16    Date for PT Re-Evaluation 12/30/22    PT Start Time 1315    PT Stop Time 1356    PT Time Calculation (min) 41 min    Activity Tolerance Patient tolerated treatment well    Behavior During Therapy WFL for tasks assessed/performed             Past Medical History:  Diagnosis Date   Allergy    pet dander   Asthma    Family history of adverse reaction to anesthesia    Sister has naseau and vomiting   Hot flashes    Hypertension    Hypokalemia    Vitamin D deficiency    Past Surgical History:  Procedure Laterality Date   CESAREAN SECTION     HERNIA REPAIR     x3 3rd with Dr. Kieth Brightly 07-02-18   [place] N/A 07/02/2018   Procedure: HERNIA REPAIR INCISIONAL OPEN RECURRENT;  Surgeon: Kinsinger, Arta Bruce, MD;  Location: [facility];  Service: General;  Laterality: N/A;   INSERTION OF MESH N/A 07/02/2018   Procedure: INSERTION OF MESH;  Surgeon: Kieth Brightly Arta Bruce, MD;  Location: [facility];  Service: General;  Laterality: N/A;   LAPAROTOMY N/A 07/03/2018   Procedure: EXPLORATORY LAPAROTOMY, RE-OPENING OF INCISION AND REMOVAL OF HEMATOMA;  Surgeon: Kieth Brightly, Arta Bruce, MD;  Location: [facility];  Service: General;  Laterality: N/A;   WISDOM TOOTH EXTRACTION     Patient Active Problem List   Diagnosis Date Noted   Incisional hernia 07/02/2018    PCP: n/a  REFERRING PROVIDER: Gaynelle Arabian, MD  REFERRING DIAG: M25.562 (ICD-10-CM) - Pain in left knee  THERAPY DIAG:  Chronic pain of left knee  Chronic pain of right knee  Muscle weakness (generalized)  Other abnormalities of gait and mobility  Rationale for Evaluation and Treatment: Rehabilitation  ONSET DATE: Years  SUBJECTIVE:   SUBJECTIVE  STATEMENT: Patient reports the surgery for L TKE has been scheduled on 03/09/2023. Patient is compliant with HEP.  PERTINENT HISTORY: Chronic knee OA  PAIN:  Are you having pain? Yes: NPRS scale: 3 currently, at worst 10/10 Pain location: Medial knee Pain description: Dull ache Aggravating factors: Cold weather, rain, being on my feet a lot, stairs Relieving factors: Heat/ice, pain medication  PRECAUTIONS: None  WEIGHT BEARING RESTRICTIONS: No  FALLS:  Has patient fallen in last 6 months? No  LIVING ENVIRONMENT: Lives with: lives with their family Lives in: House/apartment Stairs: Yes: Internal: 20 steps; on right going up Has following equipment at home: None  OCCUPATION: X-ray tech  PLOF: Independent  PATIENT GOALS: Improve strength to ready for surgery  NEXT MD VISIT:   OBJECTIVE:   DIAGNOSTIC FINDINGS: None noted in chart  PATIENT SURVEYS:  FOTO TBA  COGNITION: Overall cognitive status: Within functional limits for tasks assessed     SENSATION: WFL  EDEMA:  Occasionally  MUSCLE LENGTH: Hamstrings: Right 60 deg; Left 60 deg  POSTURE: increased lumbar lordosis  PALPATION: TTP medial knee  LOWER EXTREMITY ROM:  ROM Right eval Left eval  Hip flexion    Hip extension    Hip abduction    Hip adduction    Hip internal rotation    Hip external  rotation    Knee flexion 128 A/PROM 120 A/PROM  Knee extension -10 AROM 0 PROM -34 AROM 0 PROM  Ankle dorsiflexion    Ankle plantarflexion    Ankle inversion    Ankle eversion     (Blank rows = not tested)  LOWER EXTREMITY MMT:  MMT Right eval Left eval  Hip flexion 4- 4-  Hip extension 3- 3-  Hip abduction 3 3-  Hip adduction    Hip internal rotation    Hip external rotation    Knee flexion 4 4  Knee extension 3+ 3+  Ankle dorsiflexion    Ankle plantarflexion    Ankle inversion    Ankle eversion     (Blank rows = not tested)   FUNCTIONAL TESTS:  5 times sit to stand: 17.79 sec with  UE support Tandem stance 30 sec bilat but with increased sway and hip strategy SLS: 10 sec L, 3 sec R  GAIT: Distance walked: 62' Assistive device utilized: None Level of assistance: Complete Independence Comments: Slightly flexed trunk, wide BOS   TODAY'S TREATMENT:       [facility] Adult PT Treatment:                                                DATE: 11/06/2022 Therapeutic Exercise: Recumbent bike L2-3 x5 min SAQ 10x3" SLR 2x10 S/L clamshells & reverse clamshells YTB x10 each Bridges  Seated knee extension YTB 2x10 Bent knee fall out RYB 2x10                                                                                                                           DATE: 11/04/22  See HEP   PATIENT EDUCATION:  Education details: Progress HEP Person educated: Patient Education method: Explanation, Demonstration, and Handouts Education comprehension: verbalized understanding, returned demonstration, and needs further education  HOME EXERCISE PROGRAM: Access Code: [email or web address] URL: [email or web address] Date: 11/06/2022 Prepared by: Carlynn Herald  Exercises - Supine Quadricep Sets  - 1 x daily - 7 x weekly - 2 sets - 10 reps - 3 sec hold - Supine Active Straight Leg Raise  - 1 x daily - 7 x weekly - 2 sets - 10 reps - Supine Heel Slide  - 1 x daily - 7 x weekly - 2 sets - 10 reps - Hooklying Isometric Clamshell  - 1 x daily - 7 x weekly - 2 sets - 10 reps - Supine Bridge  - 1 x daily - 7 x weekly - 2 sets - 10 reps - Clam with Resistance  - 1 x daily - 7 x weekly - 3 sets - 10 reps - Sidelying Reverse Clamshell with Resistance  - 1 x daily - 7 x weekly - 3 sets - 10 reps - Sitting Knee Extension with Resistance  - 1 x daily -  7 x weekly - 3 sets - 10 reps   ASSESSMENT:  CLINICAL IMPRESSION: Quad strengthening progressed from SAQ to seated knee extension with light resistance. Tactile cues improved pelvic stabilization during side lying clamshells series.  Backwards trunk lean compensation demonstrated during seated knee extension on L LE.  OBJECTIVE IMPAIRMENTS: Abnormal gait, decreased activity tolerance, decreased balance, decreased endurance, decreased mobility, difficulty walking, decreased ROM, decreased strength, impaired flexibility, and pain.   ACTIVITY LIMITATIONS: lifting, squatting, stairs, transfers, and locomotion level  PARTICIPATION LIMITATIONS: cleaning, laundry, driving, shopping, community activity, and occupation  PERSONAL FACTORS: Fitness, Past/current experiences, and Time since onset of injury/illness/exacerbation are also affecting patient's functional outcome.   REHAB POTENTIAL: Good  CLINICAL DECISION MAKING: Stable/uncomplicated  EVALUATION COMPLEXITY: Low  GOALS: Goals reviewed with patient? Yes  SHORT TERM GOALS: Target date: 12/02/2022   Patient will be independent with initial HEP. Baseline:  Goal status: INITIAL   LONG TERM GOALS: Target date: 12/30/2022  Patient will be independent with advanced/ongoing HEP to improve outcomes and carryover.  Baseline:  Goal status: INITIAL  2.  Patient will report at least 50% improvement in bilat knee pain to improve QOL. Baseline:  Goal status: INITIAL  3.  Patient will be able to perform SLS x 20 sec to demo increased LE stability and endurance. Baseline:  Goal status: INITIAL  4.  Patient will demonstrate improved functional LE strength as demonstrated by 5x STS </=13 sec without UE support. Baseline:  Goal status: INITIAL  5.  Patient will be able to ambulate 1000' with LRAD and normal gait pattern without increased pain to access community.  Baseline:  Goal status: INITIAL  6. Patient will be able to ascend/descend stairs with 1 HR and reciprocal step pattern safely to access home and community.  Baseline:  Goal status: INITIAL    PLAN:  PT FREQUENCY: 2x/week with plans to decrease to 1x/wk  PT DURATION: 8 weeks  PLANNED INTERVENTIONS:  Therapeutic exercises, Therapeutic activity, Neuromuscular re-education, Balance training, Gait training, Patient/Family education, Self Care, Joint mobilization, Stair training, Aquatic Therapy, Dry Needling, Electrical stimulation, Cryotherapy, Moist heat, Taping, Vasopneumatic device, Ionotophoresis 4mg /ml Dexamethasone, Manual therapy, and Re-evaluation  PLAN FOR NEXT SESSION: Progress HEP. Progress exercises as able. Gentle knee AROM.    Hardin Negus, PTA 11/06/2022, 1:57 PM

## 2022-11-11 ENCOUNTER — Encounter: Payer: Self-pay | Admitting: Physical Therapy

## 2022-11-11 ENCOUNTER — Ambulatory Visit: Payer: 59 | Admitting: Physical Therapy

## 2022-11-11 DIAGNOSIS — G8929 Other chronic pain: Secondary | ICD-10-CM | POA: Diagnosis not present

## 2022-11-11 DIAGNOSIS — R2689 Other abnormalities of gait and mobility: Secondary | ICD-10-CM

## 2022-11-11 DIAGNOSIS — M6281 Muscle weakness (generalized): Secondary | ICD-10-CM

## 2022-11-11 NOTE — Therapy (Signed)
OUTPATIENT PHYSICAL THERAPY LOWER EXTREMITY TREATMENT   Patient Name: Lisa Everett MRN: [id number] DOB:12/24/1965, 57 y.o., female Today's Date: 11/11/2022  END OF SESSION:  PT End of Session - 11/11/22 1315     Visit Number 3    Number of Visits 16    Date for PT Re-Evaluation 12/30/22    Authorization Type [employer]    PT Start Time 1315    PT Stop Time 1355    PT Time Calculation (min) 40 min    Activity Tolerance Patient tolerated treatment well    Behavior During Therapy WFL for tasks assessed/performed              Past Medical History:  Diagnosis Date   Allergy    pet dander   Asthma    Family history of adverse reaction to anesthesia    Sister has naseau and vomiting   Hot flashes    Hypertension    Hypokalemia    Vitamin D deficiency    Past Surgical History:  Procedure Laterality Date   CESAREAN SECTION     HERNIA REPAIR     x3 3rd with Dr. Sheliah Hatch 07-02-18   INCISIONAL HERNIA REPAIR N/A 07/02/2018   Procedure: HERNIA REPAIR INCISIONAL OPEN RECURRENT;  Surgeon: Kinsinger, De Blanch, MD;  Location: [facility];  Service: General;  Laterality: N/A;   INSERTION OF MESH N/A 07/02/2018   Procedure: INSERTION OF MESH;  Surgeon: Sheliah Hatch De Blanch, MD;  Location: [facility];  Service: General;  Laterality: N/A;   LAPAROTOMY N/A 07/03/2018   Procedure: EXPLORATORY LAPAROTOMY, RE-OPENING OF INCISION AND REMOVAL OF HEMATOMA;  Surgeon: Sheliah Hatch, De Blanch, MD;  Location: [facility];  Service: General;  Laterality: N/A;   WISDOM TOOTH EXTRACTION     Patient Active Problem List   Diagnosis Date Noted   Incisional hernia 07/02/2018    PCP: n/a  REFERRING PROVIDER: Ollen Gross, MD  REFERRING DIAG: M25.562 (ICD-10-CM) - Pain in left knee  THERAPY DIAG:  Chronic pain of left knee  Chronic pain of right knee  Muscle weakness (generalized)  Other abnormalities of gait and mobility  Rationale for Evaluation and Treatment: Rehabilitation  ONSET DATE:  Years  SUBJECTIVE:   SUBJECTIVE STATEMENT: Pt reports exercises have been going okay. Pt got a shot in L knee on Saturday.   PERTINENT HISTORY: Chronic knee OA  PAIN:  Are you having pain? Yes: NPRS scale: 3-4/10 Pain location: Medial knee Pain description: Dull ache Aggravating factors: Cold weather, rain, being on my feet a lot, stairs Relieving factors: Heat/ice, pain medication  PRECAUTIONS: None  WEIGHT BEARING RESTRICTIONS: No  FALLS:  Has patient fallen in last 6 months? No  LIVING ENVIRONMENT: Lives with: lives with their family Lives in: House/apartment Stairs: Yes: Internal: 20 steps; on right going up Has following equipment at home: None  OCCUPATION: X-ray tech  PLOF: Independent  PATIENT GOALS: Improve strength to ready for surgery  NEXT MD VISIT:   OBJECTIVE:   PATIENT SURVEYS:  FOTO TBA  LOWER EXTREMITY ROM:  ROM Right eval Left eval  Hip flexion    Hip extension    Hip abduction    Hip adduction    Hip internal rotation    Hip external rotation    Knee flexion 128 A/PROM 120 A/PROM  Knee extension -10 AROM 0 PROM -34 AROM 0 PROM  Ankle dorsiflexion    Ankle plantarflexion    Ankle inversion    Ankle eversion     (Blank rows =  not tested)  LOWER EXTREMITY MMT:  MMT Right eval Left eval  Hip flexion 4- 4-  Hip extension 3- 3-  Hip abduction 3 3-  Hip adduction    Hip internal rotation    Hip external rotation    Knee flexion 4 4  Knee extension 3+ 3+  Ankle dorsiflexion    Ankle plantarflexion    Ankle inversion    Ankle eversion     (Blank rows = not tested)   FUNCTIONAL TESTS:  5 times sit to stand: 17.79 sec with UE support Tandem stance 30 sec bilat but with increased sway and hip strategy SLS: 10 sec L, 3 sec R  GAIT: Distance walked: 56' Assistive device utilized: None Level of assistance: Complete Independence Comments: Slightly flexed trunk, wide BOS  [facility] Adult PT Treatment:                                                 DATE: 11/11/22 Therapeutic Exercise: Nustep L5 x 5 min UEs/LEs  Supine heel slide x10 with strap SLR with strap for AAROM 2x10 SAQ yellow TB on R, no resistance on L 2x10 S/L clamshell yellow TB 2x10 Bridging 2x10 Sit<>stand yellow TB around knees 2x5    [facility] Adult PT Treatment:                                                DATE: 11/06/2022 Therapeutic Exercise: Recumbent bike L2-3 x5 min SAQ 10x3" SLR 2x10 S/L clamshells & reverse clamshells YTB x10 each Bridges  Seated knee extension YTB 2x10 Bent knee fall out RYB 2x10    PATIENT EDUCATION:  Education details: Progress HEP Person educated: Patient Education method: Consulting civil engineer, Media planner, and Handouts Education comprehension: verbalized understanding, returned demonstration, and needs further education  HOME EXERCISE PROGRAM: Access Code: [email or web address] URL: [email or web address] Date: 11/06/2022 Prepared by: Helane Gunther  Exercises - Supine Quadricep Sets  - 1 x daily - 7 x weekly - 2 sets - 10 reps - 3 sec hold - Supine Active Straight Leg Raise  - 1 x daily - 7 x weekly - 2 sets - 10 reps - Supine Heel Slide  - 1 x daily - 7 x weekly - 2 sets - 10 reps - Supine Bridge  - 1 x daily - 7 x weekly - 2 sets - 10 reps - Clam with Resistance  - 1 x daily - 7 x weekly - 3 sets - 10 reps - Sidelying Reverse Clamshell with Resistance  - 1 x daily - 7 x weekly - 3 sets - 10 reps - Sitting Knee Extension with Resistance  - 1 x daily - 7 x weekly - 3 sets - 10 reps   ASSESSMENT:  CLINICAL IMPRESSION: Treatment session focused on progressing strengthening as tolerated. Increased patellar malalignment when performing SAQ on L (lots of popping noted). Extensor lag noted with SLR. Worked on improving sit<>stand with reduced knee valgus.   OBJECTIVE IMPAIRMENTS: Abnormal gait, decreased activity tolerance, decreased balance, decreased endurance, decreased mobility, difficulty walking, decreased  ROM, decreased strength, impaired flexibility, and pain.    GOALS: Goals reviewed with patient? Yes  SHORT TERM GOALS: Target date: 12/02/2022   Patient will be independent with initial HEP.  Baseline:  Goal status: INITIAL   LONG TERM GOALS: Target date: 12/30/2022  Patient will be independent with advanced/ongoing HEP to improve outcomes and carryover.  Baseline:  Goal status: INITIAL  2.  Patient will report at least 50% improvement in bilat knee pain to improve QOL. Baseline:  Goal status: INITIAL  3.  Patient will be able to perform SLS x 20 sec to demo increased LE stability and endurance. Baseline:  Goal status: INITIAL  4.  Patient will demonstrate improved functional LE strength as demonstrated by 5x STS </=13 sec without UE support. Baseline:  Goal status: INITIAL  5.  Patient will be able to ambulate 1000' with LRAD and normal gait pattern without increased pain to access community.  Baseline:  Goal status: INITIAL  6. Patient will be able to ascend/descend stairs with 1 HR and reciprocal step pattern safely to access home and community.  Baseline:  Goal status: INITIAL    PLAN:  PT FREQUENCY: 2x/week with plans to decrease to 1x/wk  PT DURATION: 8 weeks  PLANNED INTERVENTIONS: Therapeutic exercises, Therapeutic activity, Neuromuscular re-education, Balance training, Gait training, Patient/Family education, Self Care, Joint mobilization, Stair training, Aquatic Therapy, Dry Needling, Electrical stimulation, Cryotherapy, Moist heat, Taping, Vasopneumatic device, Ionotophoresis 4mg /ml Dexamethasone, Manual therapy, and Re-evaluation  PLAN FOR NEXT SESSION: Progress HEP. Progress exercises as able. Gentle knee AROM.    Rafeef Lau April Ma L Mikaya Bunner, PT 11/11/2022, 1:15 PM

## 2022-11-13 ENCOUNTER — Ambulatory Visit: Payer: 59

## 2022-11-13 DIAGNOSIS — R2689 Other abnormalities of gait and mobility: Secondary | ICD-10-CM

## 2022-11-13 DIAGNOSIS — G8929 Other chronic pain: Secondary | ICD-10-CM | POA: Diagnosis not present

## 2022-11-13 DIAGNOSIS — M6281 Muscle weakness (generalized): Secondary | ICD-10-CM

## 2022-11-13 NOTE — Therapy (Signed)
OUTPATIENT PHYSICAL THERAPY LOWER EXTREMITY TREATMENT   Patient Name: Lisa Everett MRN: [id number] DOB:[birth year], 57 y.o., female Today's Date: 11/13/2022  END OF SESSION:  PT End of Session - 11/13/22 1324     Visit Number 4    Number of Visits 16    Date for PT Re-Evaluation 12/30/22    PT Start Time 1324    PT Stop Time 1405    PT Time Calculation (min) 41 min    Activity Tolerance Patient tolerated treatment well    Behavior During Therapy WFL for tasks assessed/performed              Past Medical History:  Diagnosis Date   Allergy    pet dander   Asthma    Family history of adverse reaction to anesthesia    Sister has naseau and vomiting   Hot flashes    Hypertension    Hypokalemia    Vitamin D deficiency    Past Surgical History:  Procedure Laterality Date   CESAREAN SECTION     HERNIA REPAIR     x3 3rd with Dr. Sheliah Hatch 07-02-18   INCISIONAL HERNIA REPAIR N/A 07/02/2018   Procedure: HERNIA REPAIR INCISIONAL OPEN RECURRENT;  Surgeon: Kinsinger, De Blanch, MD;  Location: [facility];  Service: General;  Laterality: N/A;   INSERTION OF MESH N/A 07/02/2018   Procedure: INSERTION OF MESH;  Surgeon: Sheliah Hatch De Blanch, MD;  Location: [facility];  Service: General;  Laterality: N/A;   LAPAROTOMY N/A 07/03/2018   Procedure: EXPLORATORY LAPAROTOMY, RE-OPENING OF INCISION AND REMOVAL OF HEMATOMA;  Surgeon: Sheliah Hatch, De Blanch, MD;  Location: [facility];  Service: General;  Laterality: N/A;   WISDOM TOOTH EXTRACTION     Patient Active Problem List   Diagnosis Date Noted   Incisional hernia 07/02/2018    PCP: n/a  REFERRING PROVIDER: Ollen Gross, MD  REFERRING DIAG: M25.562 (ICD-10-CM) - Pain in left knee  THERAPY DIAG:  Chronic pain of left knee  Chronic pain of right knee  Muscle weakness (generalized)  Other abnormalities of gait and mobility  Rationale for Evaluation and Treatment: Rehabilitation  ONSET DATE: Years  SUBJECTIVE:   SUBJECTIVE  STATEMENT: Patient reports her pain level has decreased since starting therapy, states she has less pain at end of the day in bilateral knees. Patient states she had a co-worker tell her she is "walking straighter". Patient states she received cortisone injection in L knee since last visit.   PERTINENT HISTORY: Chronic knee OA  PAIN:  Are you having pain? Yes: NPRS scale: 3/10 Pain location: Medial knee Pain description: Dull ache Aggravating factors: Cold weather, rain, being on my feet a lot, stairs Relieving factors: Heat/ice, pain medication  PRECAUTIONS: None  WEIGHT BEARING RESTRICTIONS: No  FALLS:  Has patient fallen in last 6 months? No  LIVING ENVIRONMENT: Lives with: lives with their family Lives in: House/apartment Stairs: Yes: Internal: 20 steps; on right going up Has following equipment at home: None  OCCUPATION: X-ray tech  PLOF: Independent  PATIENT GOALS: Improve strength to ready for surgery  NEXT MD VISIT:   OBJECTIVE:   PATIENT SURVEYS:  FOTO TBA  LOWER EXTREMITY ROM:  ROM Right eval Left eval  Hip flexion    Hip extension    Hip abduction    Hip adduction    Hip internal rotation    Hip external rotation    Knee flexion 128 A/PROM 120 A/PROM  Knee extension -10 AROM 0 PROM -34 AROM 0  PROM  Ankle dorsiflexion    Ankle plantarflexion    Ankle inversion    Ankle eversion     (Blank rows = not tested)  LOWER EXTREMITY MMT:  MMT Right eval Left eval  Hip flexion 4- 4-  Hip extension 3- 3-  Hip abduction 3 3-  Hip adduction    Hip internal rotation    Hip external rotation    Knee flexion 4 4  Knee extension 3+ 3+  Ankle dorsiflexion    Ankle plantarflexion    Ankle inversion    Ankle eversion     (Blank rows = not tested)   FUNCTIONAL TESTS:  5 times sit to stand: 17.79 sec with UE support Tandem stance 30 sec bilat but with increased sway and hip strategy SLS: 10 sec L, 3 sec R  GAIT: Distance walked: 30' Assistive  device utilized: None Level of assistance: Complete Independence Comments: Slightly flexed trunk, wide BOS  [facility] Adult PT Treatment:                                                DATE: 11/13/2022 Therapeutic Exercise: NuStep L6 (UE 9/LE) AROM heel slides  Bridges x5 --> RTB above knees 2x10 SLR (discontinued d/t L knee pain) L quad set 10x3" (towel roll behind knee) SAQ YTB on R, no resistance on L 2x10 each Hooklying hip add isometric (ball squeeze) S/L straight leg hip abd 2x10 S/L alt clamshells YTB/ reverse clamshells 2#AW 2x10 STS YTB above knees  Counter:  side stepping YTB around ankles  YTB hip flexion   [facility] Adult PT Treatment:                                                DATE: 11/11/22 Therapeutic Exercise: Nustep L5 x 5 min UEs/LEs  Supine heel slide x10 with strap SLR with strap for AAROM 2x10 SAQ yellow TB on R, no resistance on L 2x10 S/L clamshell yellow TB 2x10 Bridging 2x10 Sit<>stand yellow TB around knees 2x5    PATIENT EDUCATION:  Education details: Progress HEP Person educated: Patient Education method: Explanation, Demonstration, and Handouts Education comprehension: verbalized understanding, returned demonstration, and needs further education  HOME EXERCISE PROGRAM: Access Code: [email or web address] URL: [email or web address] Date: 11/06/2022 Prepared by: Helane Gunther  Exercises - Supine Quadricep Sets  - 1 x daily - 7 x weekly - 2 sets - 10 reps - 3 sec hold - Supine Active Straight Leg Raise  - 1 x daily - 7 x weekly - 2 sets - 10 reps - Supine Heel Slide  - 1 x daily - 7 x weekly - 2 sets - 10 reps - Supine Bridge  - 1 x daily - 7 x weekly - 2 sets - 10 reps - Clam with Resistance  - 1 x daily - 7 x weekly - 3 sets - 10 reps - Sidelying Reverse Clamshell with Resistance  - 1 x daily - 7 x weekly - 3 sets - 10 reps - Sitting Knee Extension with Resistance  - 1 x daily - 7 x weekly - 3 sets - 10 reps   ASSESSMENT:  CLINICAL  IMPRESSION: Increased knee pain and reported "popping" sensation reported by patient during straight leg  raises. Quad activation and strengthening continued with resisted side stepping and hip flexion. Forward flex hip posture demonstrated during standing exercises; verbal cues improved posture however, however patient unable to maintain proper postural alignment due to compensatory gait pattern.  OBJECTIVE IMPAIRMENTS: Abnormal gait, decreased activity tolerance, decreased balance, decreased endurance, decreased mobility, difficulty walking, decreased ROM, decreased strength, impaired flexibility, and pain.    GOALS: Goals reviewed with patient? Yes  SHORT TERM GOALS: Target date: 12/02/2022   Patient will be independent with initial HEP. Baseline:  Goal status: INITIAL   LONG TERM GOALS: Target date: 12/30/2022  Patient will be independent with advanced/ongoing HEP to improve outcomes and carryover.  Baseline:  Goal status: INITIAL  2.  Patient will report at least 50% improvement in bilat knee pain to improve QOL. Baseline:  Goal status: INITIAL  3.  Patient will be able to perform SLS x 20 sec to demo increased LE stability and endurance. Baseline:  Goal status: INITIAL  4.  Patient will demonstrate improved functional LE strength as demonstrated by 5x STS </=13 sec without UE support. Baseline:  Goal status: INITIAL  5.  Patient will be able to ambulate 1000' with LRAD and normal gait pattern without increased pain to access community.  Baseline:  Goal status: INITIAL  6. Patient will be able to ascend/descend stairs with 1 HR and reciprocal step pattern safely to access home and community.  Baseline:  Goal status: INITIAL    PLAN:  PT FREQUENCY: 2x/week with plans to decrease to 1x/wk  PT DURATION: 8 weeks  PLANNED INTERVENTIONS: Therapeutic exercises, Therapeutic activity, Neuromuscular re-education, Balance training, Gait training, Patient/Family education, Self  Care, Joint mobilization, Stair training, Aquatic Therapy, Dry Needling, Electrical stimulation, Cryotherapy, Moist heat, Taping, Vasopneumatic device, Ionotophoresis 4mg /ml Dexamethasone, Manual therapy, and Re-evaluation  PLAN FOR NEXT SESSION: Progress HEP. Progress exercises as able. Gentle knee AROM.    Hardin Negus, PTA 11/13/2022, 2:10 PM

## 2022-11-18 ENCOUNTER — Encounter: Payer: Self-pay | Admitting: Physical Therapy

## 2022-11-18 ENCOUNTER — Ambulatory Visit: Payer: 59 | Admitting: Physical Therapy

## 2022-11-18 DIAGNOSIS — M6281 Muscle weakness (generalized): Secondary | ICD-10-CM

## 2022-11-18 DIAGNOSIS — R2689 Other abnormalities of gait and mobility: Secondary | ICD-10-CM

## 2022-11-18 DIAGNOSIS — G8929 Other chronic pain: Secondary | ICD-10-CM | POA: Diagnosis not present

## 2022-11-18 NOTE — Therapy (Signed)
OUTPATIENT PHYSICAL THERAPY LOWER EXTREMITY TREATMENT   Patient Name: Lisa Everett MRN: [id number] DOB:03-23-1966, 56 y.o., female Today's Date: 11/18/2022  END OF SESSION:  PT End of Session - 11/18/22 1315     Visit Number 5    Number of Visits 16    Date for PT Re-Evaluation 12/30/22    Authorization Type [employer]    PT Start Time 1315    PT Stop Time 1400    PT Time Calculation (min) 45 min    Activity Tolerance Patient tolerated treatment well    Behavior During Therapy WFL for tasks assessed/performed               Past Medical History:  Diagnosis Date   Allergy    pet dander   Asthma    Family history of adverse reaction to anesthesia    Sister has naseau and vomiting   Hot flashes    Hypertension    Hypokalemia    Vitamin D deficiency    Past Surgical History:  Procedure Laterality Date   CESAREAN SECTION     HERNIA REPAIR     x3 3rd with Dr. Sheliah Hatch 07-02-18   INCISIONAL HERNIA REPAIR N/A 07/02/2018   Procedure: HERNIA REPAIR INCISIONAL OPEN RECURRENT;  Surgeon: Kinsinger, De Blanch, MD;  Location: [facility];  Service: General;  Laterality: N/A;   INSERTION OF MESH N/A 07/02/2018   Procedure: INSERTION OF MESH;  Surgeon: Sheliah Hatch De Blanch, MD;  Location: [facility];  Service: General;  Laterality: N/A;   LAPAROTOMY N/A 07/03/2018   Procedure: EXPLORATORY LAPAROTOMY, RE-OPENING OF INCISION AND REMOVAL OF HEMATOMA;  Surgeon: Sheliah Hatch, De Blanch, MD;  Location: [facility];  Service: General;  Laterality: N/A;   WISDOM TOOTH EXTRACTION     Patient Active Problem List   Diagnosis Date Noted   Incisional hernia 07/02/2018    PCP: n/a  REFERRING PROVIDER: Ollen Gross, MD  REFERRING DIAG: M25.562 (ICD-10-CM) - Pain in left knee  THERAPY DIAG:  Chronic pain of left knee  Chronic pain of right knee  Muscle weakness (generalized)  Other abnormalities of gait and mobility  Rationale for Evaluation and Treatment: Rehabilitation  ONSET DATE:  Years  SUBJECTIVE:   SUBJECTIVE STATEMENT: Pt states knees have been getting better. Thinks her quads are getting stronger.   PERTINENT HISTORY: Chronic knee OA  PAIN:  Are you having pain? Yes: NPRS scale: 3/10 Pain location: Medial knee Pain description: Dull ache Aggravating factors: Cold weather, rain, being on my feet a lot, stairs Relieving factors: Heat/ice, pain medication  PRECAUTIONS: None  WEIGHT BEARING RESTRICTIONS: No  FALLS:  Has patient fallen in last 6 months? No  LIVING ENVIRONMENT: Lives with: lives with their family Lives in: House/apartment Stairs: Yes: Internal: 20 steps; on right going up Has following equipment at home: None  OCCUPATION: X-ray tech  PLOF: Independent  PATIENT GOALS: Improve strength to ready for surgery  NEXT MD VISIT:   OBJECTIVE:   PATIENT SURVEYS:  FOTO TBA  LOWER EXTREMITY ROM:  ROM Right eval Left eval R/L 11/18/22  Hip flexion     Hip extension     Hip abduction     Hip adduction     Hip internal rotation     Hip external rotation     Knee flexion 128 A/PROM 120 A/PROM 125/125 AROM  Knee extension -10 AROM 0 PROM -34 AROM 0 PROM -5/0 AROM  Ankle dorsiflexion     Ankle plantarflexion     Ankle  inversion     Ankle eversion      (Blank rows = not tested)  LOWER EXTREMITY MMT:  MMT Right eval Left eval  Hip flexion 4- 4-  Hip extension 3- 3-  Hip abduction 3 3-  Hip adduction    Hip internal rotation    Hip external rotation    Knee flexion 4 4  Knee extension 3+ 3+  Ankle dorsiflexion    Ankle plantarflexion    Ankle inversion    Ankle eversion     (Blank rows = not tested)   FUNCTIONAL TESTS:  5 times sit to stand: 17.79 sec with UE support Tandem stance 30 sec bilat but with increased sway and hip strategy SLS: 10 sec L, 3 sec R  GAIT: Distance walked: 70' Assistive device utilized: None Level of assistance: Complete Independence Comments: Slightly flexed trunk, wide BOS  [facility]  Adult PT Treatment:                                                DATE: 11/18/22 Therapeutic Exercise: Recumbent bike L3 x 6 min AROM heel slides x10 Hamstring stretch with strap 2x30 sec R SLR 2x10 with strap assist for L LE S/L Hip abd 2x10 R&L Prone hip ext 2x10 R&L Prone hamstring curl 2x10 R&L Prone quad set 10x3 sec Standing quad set against ball x10 R&L Standing hip 3 way on slider x5 Leg press 45# 2x10 Gait x 2 laps -- cues to decrease L trunk lean, heel/toe pattern with increased trunk extension. Backwards walking x 15'   [facility] Adult PT Treatment:                                                DATE: 11/13/2022 Therapeutic Exercise: NuStep L6 (UE 9/LE) AROM heel slides  Bridges x5 --> RTB above knees 2x10 SLR (discontinued d/t L knee pain) L quad set 10x3" (towel roll behind knee) SAQ YTB on R, no resistance on L 2x10 each Hooklying hip add isometric (ball squeeze) S/L straight leg hip abd 2x10 S/L alt clamshells YTB/ reverse clamshells 2#AW 2x10 STS YTB above knees  Counter:  side stepping YTB around ankles  YTB hip flexion   PATIENT EDUCATION:  Education details: Progress HEP Person educated: Patient Education method: Explanation, Demonstration, and Handouts Education comprehension: verbalized understanding, returned demonstration, and needs further education  HOME EXERCISE PROGRAM: Access Code: [email or web address] URL: [email or web address] Date: 11/06/2022 Prepared by: Helane Gunther  Exercises - Supine Quadricep Sets  - 1 x daily - 7 x weekly - 2 sets - 10 reps - 3 sec hold - Supine Active Straight Leg Raise  - 1 x daily - 7 x weekly - 2 sets - 10 reps - Supine Heel Slide  - 1 x daily - 7 x weekly - 2 sets - 10 reps - Supine Bridge  - 1 x daily - 7 x weekly - 2 sets - 10 reps - Clam with Resistance  - 1 x daily - 7 x weekly - 3 sets - 10 reps - Sidelying Reverse Clamshell with Resistance  - 1 x daily - 7 x weekly - 3 sets - 10 reps - Sitting Knee  Extension with Resistance  - 1 x  daily - 7 x weekly - 3 sets - 10 reps   ASSESSMENT:  CLINICAL IMPRESSION: Continued to work on progressing strengthening. L quad remains weaker than R; however, R glute appears weaker than L during prone exercises. Slow progression to closed kinetic chain exercises as pt tolerates increase in weight bearing.   OBJECTIVE IMPAIRMENTS: Abnormal gait, decreased activity tolerance, decreased balance, decreased endurance, decreased mobility, difficulty walking, decreased ROM, decreased strength, impaired flexibility, and pain.    GOALS: Goals reviewed with patient? Yes  SHORT TERM GOALS: Target date: 12/02/2022   Patient will be independent with initial HEP. Baseline:  Goal status: INITIAL   LONG TERM GOALS: Target date: 12/30/2022  Patient will be independent with advanced/ongoing HEP to improve outcomes and carryover.  Baseline:  Goal status: INITIAL  2.  Patient will report at least 50% improvement in bilat knee pain to improve QOL. Baseline:  Goal status: INITIAL  3.  Patient will be able to perform SLS x 20 sec to demo increased LE stability and endurance. Baseline:  Goal status: INITIAL  4.  Patient will demonstrate improved functional LE strength as demonstrated by 5x STS </=13 sec without UE support. Baseline:  Goal status: INITIAL  5.  Patient will be able to ambulate 1000' with LRAD and normal gait pattern without increased pain to access community.  Baseline:  Goal status: INITIAL  6. Patient will be able to ascend/descend stairs with 1 HR and reciprocal step pattern safely to access home and community.  Baseline:  Goal status: INITIAL    PLAN:  PT FREQUENCY: 2x/week with plans to decrease to 1x/wk  PT DURATION: 8 weeks  PLANNED INTERVENTIONS: Therapeutic exercises, Therapeutic activity, Neuromuscular re-education, Balance training, Gait training, Patient/Family education, Self Care, Joint mobilization, Stair training, Aquatic  Therapy, Dry Needling, Electrical stimulation, Cryotherapy, Moist heat, Taping, Vasopneumatic device, Ionotophoresis 4mg /ml Dexamethasone, Manual therapy, and Re-evaluation  PLAN FOR NEXT SESSION: Progress HEP. Progress exercises as able. Gentle knee AROM.    Lott Seelbach April Ma L Nathalee Smarr, PT 11/18/2022, 1:16 PM

## 2022-11-20 ENCOUNTER — Ambulatory Visit: Payer: 59

## 2022-11-20 DIAGNOSIS — G8929 Other chronic pain: Secondary | ICD-10-CM | POA: Diagnosis not present

## 2022-11-20 DIAGNOSIS — R2689 Other abnormalities of gait and mobility: Secondary | ICD-10-CM

## 2022-11-20 DIAGNOSIS — M6281 Muscle weakness (generalized): Secondary | ICD-10-CM

## 2022-11-20 NOTE — Therapy (Signed)
OUTPATIENT PHYSICAL THERAPY LOWER EXTREMITY TREATMENT   Patient Name: Lisa Everett MRN: [id number] DOB:October 16, 1966, 57 y.o., female Today's Date: 11/20/2022  END OF SESSION:  PT End of Session - 11/20/22 1322     Visit Number 6    Number of Visits 16    Date for PT Re-Evaluation 12/30/22    Authorization Type [employer]    PT Start Time 1320               Past Medical History:  Diagnosis Date   Allergy    pet dander   Asthma    Family history of adverse reaction to anesthesia    Sister has naseau and vomiting   Hot flashes    Hypertension    Hypokalemia    Vitamin D deficiency    Past Surgical History:  Procedure Laterality Date   CESAREAN SECTION     HERNIA REPAIR     x3 3rd with Dr. Sheliah Hatch 07-02-18   INCISIONAL HERNIA REPAIR N/A 07/02/2018   Procedure: HERNIA REPAIR INCISIONAL OPEN RECURRENT;  Surgeon: Kinsinger, De Blanch, MD;  Location: [facility];  Service: General;  Laterality: N/A;   INSERTION OF MESH N/A 07/02/2018   Procedure: INSERTION OF MESH;  Surgeon: Sheliah Hatch De Blanch, MD;  Location: [facility];  Service: General;  Laterality: N/A;   LAPAROTOMY N/A 07/03/2018   Procedure: EXPLORATORY LAPAROTOMY, RE-OPENING OF INCISION AND REMOVAL OF HEMATOMA;  Surgeon: Sheliah Hatch, De Blanch, MD;  Location: [facility];  Service: General;  Laterality: N/A;   WISDOM TOOTH EXTRACTION     Patient Active Problem List   Diagnosis Date Noted   Incisional hernia 07/02/2018    PCP: n/a  REFERRING PROVIDER: Ollen Gross, MD  REFERRING DIAG: M25.562 (ICD-10-CM) - Pain in left knee  THERAPY DIAG:  Chronic pain of left knee  Chronic pain of right knee  Muscle weakness (generalized)  Other abnormalities of gait and mobility  Rationale for Evaluation and Treatment: Rehabilitation  ONSET DATE: Years  SUBJECTIVE:   SUBJECTIVE STATEMENT: Patient reports the weather is causing more joint pain. Patient states she needs to space out her PT visits due to yearly cap on  visits.   PERTINENT HISTORY: Chronic knee OA  PAIN:  Are you having pain? Yes: NPRS scale: 4/10 Pain location: Medial knee Pain description: Dull ache Aggravating factors: Cold weather, rain, being on my feet a lot, stairs Relieving factors: Heat/ice, pain medication  PRECAUTIONS: None  WEIGHT BEARING RESTRICTIONS: No  FALLS:  Has patient fallen in last 6 months? No  LIVING ENVIRONMENT: Lives with: lives with their family Lives in: House/apartment Stairs: Yes: Internal: 20 steps; on right going up Has following equipment at home: None  OCCUPATION: X-ray tech  PLOF: Independent  PATIENT GOALS: Improve strength to ready for surgery  NEXT MD VISIT:   OBJECTIVE:   PATIENT SURVEYS:  FOTO TBA  LOWER EXTREMITY ROM:  ROM Right eval Left eval R/L 11/18/22  Hip flexion     Hip extension     Hip abduction     Hip adduction     Hip internal rotation     Hip external rotation     Knee flexion 128 A/PROM 120 A/PROM 125/125 AROM  Knee extension -10 AROM 0 PROM -34 AROM 0 PROM -5/0 AROM  Ankle dorsiflexion     Ankle plantarflexion     Ankle inversion     Ankle eversion      (Blank rows = not tested)  LOWER EXTREMITY MMT:  MMT  Right eval Left eval  Hip flexion 4- 4-  Hip extension 3- 3-  Hip abduction 3 3-  Hip adduction    Hip internal rotation    Hip external rotation    Knee flexion 4 4  Knee extension 3+ 3+  Ankle dorsiflexion    Ankle plantarflexion    Ankle inversion    Ankle eversion     (Blank rows = not tested)   FUNCTIONAL TESTS:  5 times sit to stand: 17.79 sec with UE support Tandem stance 30 sec bilat but with increased sway and hip strategy SLS: 10 sec L, 3 sec R  GAIT: Distance walked: 41' Assistive device utilized: None Level of assistance: Complete Independence Comments: Slightly flexed trunk, wide BOS   [facility] Adult PT Treatment:                                                DATE: 11/20/2022 Therapeutic Exercise: Recumbent  bike L3-5 x 36min Supine: L quad set 10x5" AROM heel slides x10 SLR small ROM parallel, ER, IR x10 each Hooklying hip add/abd isometrics 10x5" each S/L bent knee hip abd x10 S/L calmeshells YTB 2x10 S/L straight leg hip abd YTB x10 Prone hip ext 2x10 Standing L TKE against small ball x10 Bkwd walking 4x20' (resitance added at back) Leg press DL 45# 2x10   [facility] Adult PT Treatment:                                                DATE: 11/18/22 Therapeutic Exercise: Recumbent bike L3 x 6 min AROM heel slides x10 Hamstring stretch with strap 2x30 sec R SLR 2x10 with strap assist for L LE S/L Hip abd 2x10 R&L Prone hip ext 2x10 R&L Prone hamstring curl 2x10 R&L Prone quad set 10x3 sec Standing quad set against ball x10 R&L Standing hip 3 way on slider x5 Leg press 45# 2x10 Gait x 2 laps -- cues to decrease L trunk lean, heel/toe pattern with increased trunk extension. Backwards walking x 15'   PATIENT EDUCATION:  Education details: Progress HEP Person educated: Patient Education method: Explanation, Demonstration, and Handouts Education comprehension: verbalized understanding, returned demonstration, and needs further education  HOME EXERCISE PROGRAM: Access Code: [email or web address] URL: [email or web address] Date: 11/20/2022 Prepared by: Helane Gunther  Exercises - Supine Quadricep Sets  - 1 x daily - 7 x weekly - 2 sets - 10 reps - 3 sec hold - Supine Heel Slide  - 1 x daily - 7 x weekly - 2 sets - 10 reps - Supine Bridge  - 1 x daily - 7 x weekly - 2 sets - 10 reps - Clam with Resistance  - 1 x daily - 7 x weekly - 3 sets - 10 reps - Sidelying Reverse Clamshell with Resistance  - 1 x daily - 7 x weekly - 3 sets - 10 reps - Sitting Knee Extension with Resistance  - 1 x daily - 7 x weekly - 3 sets - 10 reps - Sit to Stand with Counter Support  - 1 x daily - 7 x weekly - 2 sets - 5 reps - Prone Quadriceps Stretch with Strap  - 1 x daily - 7 x weekly - 3 sets -  10 reps -  Small Range Straight Leg Raise  - 1 x daily - 7 x weekly - 3 sets - 10 reps - Supine Hip Adduction Isometric with Ball  - 1 x daily - 7 x weekly - 3 sets - 10 reps - 5 sec hold - Standing 3-Way Leg Reach with Resistance at Ankles and Counter Support  - 1 x daily - 7 x weekly - 3 sets - 10 reps   ASSESSMENT:  CLINICAL IMPRESSION: Hip strengthening progressed with resisted hip abd variations and prone hip extension; cueing improved pelvic stability and postural alignment. Total knee extension AROM continued in standing with ball behind knee to improve proprioceptive feedback. Improved knee stability noted during leg press exercise.  OBJECTIVE IMPAIRMENTS: Abnormal gait, decreased activity tolerance, decreased balance, decreased endurance, decreased mobility, difficulty walking, decreased ROM, decreased strength, impaired flexibility, and pain.    GOALS: Goals reviewed with patient? Yes  SHORT TERM GOALS: Target date: 12/02/2022   Patient will be independent with initial HEP. Baseline:  Goal status: INITIAL   LONG TERM GOALS: Target date: 12/30/2022  Patient will be independent with advanced/ongoing HEP to improve outcomes and carryover.  Baseline:  Goal status: INITIAL  2.  Patient will report at least 50% improvement in bilat knee pain to improve QOL. Baseline:  Goal status: INITIAL  3.  Patient will be able to perform SLS x 20 sec to demo increased LE stability and endurance. Baseline:  Goal status: INITIAL  4.  Patient will demonstrate improved functional LE strength as demonstrated by 5x STS </=13 sec without UE support. Baseline:  Goal status: INITIAL  5.  Patient will be able to ambulate 1000' with LRAD and normal gait pattern without increased pain to access community.  Baseline:  Goal status: INITIAL  6. Patient will be able to ascend/descend stairs with 1 HR and reciprocal step pattern safely to access home and community.  Baseline:  Goal status:  INITIAL    PLAN:  PT FREQUENCY: 2x/week with plans to decrease to 1x/wk  PT DURATION: 8 weeks  PLANNED INTERVENTIONS: Therapeutic exercises, Therapeutic activity, Neuromuscular re-education, Balance training, Gait training, Patient/Family education, Self Care, Joint mobilization, Stair training, Aquatic Therapy, Dry Needling, Electrical stimulation, Cryotherapy, Moist heat, Taping, Vasopneumatic device, Ionotophoresis 4mg /ml Dexamethasone, Manual therapy, and Re-evaluation  PLAN FOR NEXT SESSION: Update HEP each visit. Progress exercises as able; focus on quad strengthening   Hardin Negus, PTA 11/20/2022, 1:23 PM

## 2022-11-25 ENCOUNTER — Encounter: Payer: 59 | Admitting: Physical Therapy

## 2022-12-02 ENCOUNTER — Ambulatory Visit: Payer: 59 | Attending: Orthopedic Surgery | Admitting: Physical Therapy

## 2022-12-02 ENCOUNTER — Encounter: Payer: Self-pay | Admitting: Physical Therapy

## 2022-12-02 DIAGNOSIS — M25561 Pain in right knee: Secondary | ICD-10-CM | POA: Diagnosis present

## 2022-12-02 DIAGNOSIS — G8929 Other chronic pain: Secondary | ICD-10-CM | POA: Diagnosis present

## 2022-12-02 DIAGNOSIS — R2689 Other abnormalities of gait and mobility: Secondary | ICD-10-CM | POA: Insufficient documentation

## 2022-12-02 DIAGNOSIS — M25562 Pain in left knee: Secondary | ICD-10-CM | POA: Diagnosis present

## 2022-12-02 DIAGNOSIS — M6281 Muscle weakness (generalized): Secondary | ICD-10-CM | POA: Insufficient documentation

## 2022-12-02 NOTE — Therapy (Signed)
OUTPATIENT PHYSICAL THERAPY LOWER EXTREMITY TREATMENT   Patient Name: Lisa Everett MRN: [id number] DOB:[birth year], 57 y.o., female Today's Date: 12/02/2022  END OF SESSION:  PT End of Session - 12/02/22 1353     Visit Number 7    Number of Visits 16    Date for PT Re-Evaluation 12/30/22    PT Start Time 1315    PT Stop Time 1354    PT Time Calculation (min) 39 min    Activity Tolerance Patient tolerated treatment well    Behavior During Therapy WFL for tasks assessed/performed                Past Medical History:  Diagnosis Date   Allergy    pet dander   Asthma    Family history of adverse reaction to anesthesia    Sister has naseau and vomiting   Hot flashes    Hypertension    Hypokalemia    Vitamin D deficiency    Past Surgical History:  Procedure Laterality Date   CESAREAN SECTION     HERNIA REPAIR     x3 3rd with Dr. Kieth Brightly 07-02-18   [place] N/A 07/02/2018   Procedure: HERNIA REPAIR INCISIONAL OPEN RECURRENT;  Surgeon: Kinsinger, Arta Bruce, MD;  Location: [facility];  Service: General;  Laterality: N/A;   INSERTION OF MESH N/A 07/02/2018   Procedure: INSERTION OF MESH;  Surgeon: Kieth Brightly Arta Bruce, MD;  Location: [facility];  Service: General;  Laterality: N/A;   LAPAROTOMY N/A 07/03/2018   Procedure: EXPLORATORY LAPAROTOMY, RE-OPENING OF INCISION AND REMOVAL OF HEMATOMA;  Surgeon: Kieth Brightly, Arta Bruce, MD;  Location: [facility];  Service: General;  Laterality: N/A;   WISDOM TOOTH EXTRACTION     Patient Active Problem List   Diagnosis Date Noted   Incisional hernia 07/02/2018    PCP: n/a  REFERRING PROVIDER: Gaynelle Arabian, MD  REFERRING DIAG: M25.562 (ICD-10-CM) - Pain in left knee  THERAPY DIAG:  Chronic pain of left knee  Chronic pain of right knee  Muscle weakness (generalized)  Other abnormalities of gait and mobility  Rationale for Evaluation and Treatment: Rehabilitation  ONSET DATE: Years  SUBJECTIVE:    SUBJECTIVE STATEMENT: Patient reports she feel "stiff" today. She has been performing HEP  PERTINENT HISTORY: Chronic knee OA  PAIN:  Are you having pain? Yes: NPRS scale: 3/10 Pain location: Medial knee Pain description: tight Aggravating factors: Cold weather, rain, being on my feet a lot, stairs Relieving factors: Heat/ice, pain medication  PRECAUTIONS: None  WEIGHT BEARING RESTRICTIONS: No  FALLS:  Has patient fallen in last 6 months? No  LIVING ENVIRONMENT: Lives with: lives with their family Lives in: House/apartment Stairs: Yes: Internal: 20 steps; on right going up Has following equipment at home: None  OCCUPATION: X-ray tech  PLOF: Independent  PATIENT GOALS: Improve strength to ready for surgery  NEXT MD VISIT:   OBJECTIVE:   PATIENT SURVEYS:  FOTO TBA  LOWER EXTREMITY ROM:  ROM Right eval Left eval R/L 11/18/22  Hip flexion     Hip extension     Hip abduction     Hip adduction     Hip internal rotation     Hip external rotation     Knee flexion 128 A/PROM 120 A/PROM 125/125 AROM  Knee extension -10 AROM 0 PROM -34 AROM 0 PROM -5/0 AROM  Ankle dorsiflexion     Ankle plantarflexion     Ankle inversion     Ankle eversion      (  Blank rows = not tested)  LOWER EXTREMITY MMT:  MMT Right eval Left eval  Hip flexion 4- 4-  Hip extension 3- 3-  Hip abduction 3 3-  Hip adduction    Hip internal rotation    Hip external rotation    Knee flexion 4 4  Knee extension 3+ 3+  Ankle dorsiflexion    Ankle plantarflexion    Ankle inversion    Ankle eversion     (Blank rows = not tested)   FUNCTIONAL TESTS:  5 times sit to stand: 17.79 sec with UE support Tandem stance 30 sec bilat but with increased sway and hip strategy SLS: 10 sec L, 3 sec R  5x STS (12/02/22): 16.73 sec without UE support SLS (12/02/22): 3 sec Lt, 3 sec Rt  GAIT: Distance walked: 64' Assistive device utilized: None Level of assistance: Complete  Independence Comments: Slightly flexed trunk, wide BOS   [facility] Adult PT Treatment:                                                DATE: 12/02/22 Therapeutic Exercise: Nustep L6 x 5 min warm up 5x STS 16.73 sec SLS 2 x 30 sec bilat with UE support Leg press DL 45# 2 x 10 bilat TKE 2 x 10 ball on wall Standing heel raise 2 x 10 SLR small range parallel, ER, IR x10 each Hip adduction isometric 10 x 5 sec Hooklying hip abduction 2 x 10 green TB Sidelying clam yellow TB 2 x 10 bilat Sidelying hip abd yellow TB 2 x 10 Prone hip ext yellow TB 2 x 10 Prone HS curl yellow TB 2 x10   [facility] Adult PT Treatment:                                                DATE: 11/20/2022 Therapeutic Exercise: Recumbent bike L3-5 x 18min Supine: L quad set 10x5" AROM heel slides x10 SLR small ROM parallel, ER, IR x10 each Hooklying hip add/abd isometrics 10x5" each S/L bent knee hip abd x10 S/L calmeshells YTB 2x10 S/L straight leg hip abd YTB x10 Prone hip ext 2x10 Standing L TKE against small ball x10 Bkwd walking 4x20' (resitance added at back) Leg press DL 45# 2x10   PATIENT EDUCATION:  Education details: Progress HEP Person educated: Patient Education method: Explanation, Demonstration, and Handouts Education comprehension: verbalized understanding, returned demonstration, and needs further education  HOME EXERCISE PROGRAM: Access Code: [email or web address] URL: [email or web address] Date: 12/02/2022 Prepared by: Isabelle Course  Exercises - Supine Quadricep Sets  - 1 x daily - 7 x weekly - 2 sets - 10 reps - 3 sec hold - Supine Heel Slide  - 1 x daily - 7 x weekly - 2 sets - 10 reps - Supine Bridge  - 1 x daily - 7 x weekly - 2 sets - 10 reps - Clam with Resistance  - 1 x daily - 7 x weekly - 3 sets - 10 reps - Sidelying Reverse Clamshell with Resistance  - 1 x daily - 7 x weekly - 3 sets - 10 reps - Sitting Knee Extension with Resistance  - 1 x daily - 7 x weekly - 3 sets - 10 reps -  Sit to Stand with Counter Support  - 1 x daily - 7 x weekly - 2 sets - 5 reps - Prone Quadriceps Stretch with Strap  - 1 x daily - 7 x weekly - 3 sets - 10 reps - Small Range Straight Leg Raise  - 1 x daily - 7 x weekly - 3 sets - 10 reps - Supine Hip Adduction Isometric with Ball  - 1 x daily - 7 x weekly - 3 sets - 10 reps - 5 sec hold - Standing 3-Way Leg Reach with Resistance at Ankles and Counter Support  - 1 x daily - 7 x weekly - 3 sets - 10 reps - Prone Hip Extension  - 1 x daily - 7 x weekly - 3 sets - 10 reps - Prone Knee Flexion  - 1 x daily - 7 x weekly - 3 sets - 10 reps   ASSESSMENT:  CLINICAL IMPRESSION: Pt continues with difficulty with quad set and SLR on LT LE due to increased pain and muscle weakness. Able to add resistance to prone exercises with good tolerance. Added to HEP  OBJECTIVE IMPAIRMENTS: Abnormal gait, decreased activity tolerance, decreased balance, decreased endurance, decreased mobility, difficulty walking, decreased ROM, decreased strength, impaired flexibility, and pain.    GOALS: Goals reviewed with patient? Yes  SHORT TERM GOALS: Target date: 12/02/2022   Patient will be independent with initial HEP. Baseline:  Goal status: MET   LONG TERM GOALS: Target date: 12/30/2022  Patient will be independent with advanced/ongoing HEP to improve outcomes and carryover.  Baseline:  Goal status: INITIAL  2.  Patient will report at least 50% improvement in bilat knee pain to improve QOL. Baseline:  Goal status: INITIAL  3.  Patient will be able to perform SLS x 20 sec to demo increased LE stability and endurance. Baseline:  Goal status: INITIAL  4.  Patient will demonstrate improved functional LE strength as demonstrated by 5x STS </=13 sec without UE support. Baseline:  Goal status: IN PROGRESS  5.  Patient will be able to ambulate 1000' with LRAD and normal gait pattern without increased pain to access community.  Baseline:  Goal status:  INITIAL  6. Patient will be able to ascend/descend stairs with 1 HR and reciprocal step pattern safely to access home and community.  Baseline:  Goal status: INITIAL    PLAN:  PT FREQUENCY: 2x/week with plans to decrease to 1x/wk  PT DURATION: 8 weeks  PLANNED INTERVENTIONS: Therapeutic exercises, Therapeutic activity, Neuromuscular re-education, Balance training, Gait training, Patient/Family education, Self Care, Joint mobilization, Stair training, Aquatic Therapy, Dry Needling, Electrical stimulation, Cryotherapy, Moist heat, Taping, Vasopneumatic device, Ionotophoresis 4mg /ml Dexamethasone, Manual therapy, and Re-evaluation  PLAN FOR NEXT SESSION: Update HEP each visit. Progress exercises as able; focus on quad strengthening   Gamaliel Charney, PT 12/02/2022, 1:54 PM

## 2022-12-09 ENCOUNTER — Encounter: Payer: 59 | Admitting: Physical Therapy

## 2022-12-11 ENCOUNTER — Encounter: Payer: 59 | Admitting: Physical Therapy

## 2022-12-16 ENCOUNTER — Ambulatory Visit: Payer: 59 | Admitting: Physical Therapy

## 2022-12-16 ENCOUNTER — Encounter: Payer: Self-pay | Admitting: Physical Therapy

## 2022-12-16 DIAGNOSIS — M6281 Muscle weakness (generalized): Secondary | ICD-10-CM

## 2022-12-16 DIAGNOSIS — G8929 Other chronic pain: Secondary | ICD-10-CM

## 2022-12-16 DIAGNOSIS — R2689 Other abnormalities of gait and mobility: Secondary | ICD-10-CM

## 2022-12-16 DIAGNOSIS — M25562 Pain in left knee: Secondary | ICD-10-CM | POA: Diagnosis not present

## 2022-12-16 NOTE — Therapy (Addendum)
OUTPATIENT PHYSICAL THERAPY LOWER EXTREMITY TREATMENT   Patient Name: Lisa Everett MRN: [id number] DOB:Jan 27, 1966, 57 y.o., female Today's Date: 12/16/2022  END OF SESSION:  PT End of Session - 12/16/22 1304     Visit Number 8    Number of Visits 16    Date for PT Re-Evaluation 12/30/22    Authorization Type [employer]    PT Start Time 1315    PT Stop Time 1400    PT Time Calculation (min) 45 min    Activity Tolerance Patient tolerated treatment well    Behavior During Therapy WFL for tasks assessed/performed                Past Medical History:  Diagnosis Date   Allergy    pet dander   Asthma    Family history of adverse reaction to anesthesia    Sister has naseau and vomiting   Hot flashes    Hypertension    Hypokalemia    Vitamin D deficiency    Past Surgical History:  Procedure Laterality Date   CESAREAN SECTION     HERNIA REPAIR     x3 3rd with Dr. Kieth Brightly 07-02-18   [place] N/A 07/02/2018   Procedure: HERNIA REPAIR INCISIONAL OPEN RECURRENT;  Surgeon: Kinsinger, Arta Bruce, MD;  Location: [facility];  Service: General;  Laterality: N/A;   INSERTION OF MESH N/A 07/02/2018   Procedure: INSERTION OF MESH;  Surgeon: Kieth Brightly Arta Bruce, MD;  Location: [facility];  Service: General;  Laterality: N/A;   LAPAROTOMY N/A 07/03/2018   Procedure: EXPLORATORY LAPAROTOMY, RE-OPENING OF INCISION AND REMOVAL OF HEMATOMA;  Surgeon: Kieth Brightly, Arta Bruce, MD;  Location: [facility];  Service: General;  Laterality: N/A;   WISDOM TOOTH EXTRACTION     Patient Active Problem List   Diagnosis Date Noted   Incisional hernia 07/02/2018    PCP: n/a  REFERRING PROVIDER: Gaynelle Arabian, MD  REFERRING DIAG: M25.562 (ICD-10-CM) - Pain in left knee  THERAPY DIAG:  Chronic pain of left knee  Chronic pain of right knee  Muscle weakness (generalized)  Other abnormalities of gait and mobility  Rationale for Evaluation and Treatment: Rehabilitation  ONSET DATE:  Years  SUBJECTIVE:   SUBJECTIVE STATEMENT: L knee continues to have difficulty with straight -- SLR continues to hurt. Has been doing the red TB at home.   PERTINENT HISTORY: Chronic knee OA  PAIN:  Are you having pain? Yes: NPRS scale: 3/10 Pain location: Medial knee Pain description: tight Aggravating factors: Cold weather, rain, being on my feet a lot, stairs Relieving factors: Heat/ice, pain medication  PRECAUTIONS: None  WEIGHT BEARING RESTRICTIONS: No  FALLS:  Has patient fallen in last 6 months? No  LIVING ENVIRONMENT: Lives with: lives with their family Lives in: House/apartment Stairs: Yes: Internal: 20 steps; on right going up Has following equipment at home: None  OCCUPATION: X-ray tech  PLOF: Independent  PATIENT GOALS: Improve strength to ready for surgery  NEXT MD VISIT:   OBJECTIVE:   PATIENT SURVEYS:  FOTO TBA  LOWER EXTREMITY ROM:  ROM Right eval Left eval R/L 11/18/22  Hip flexion     Hip extension     Hip abduction     Hip adduction     Hip internal rotation     Hip external rotation     Knee flexion 128 A/PROM 120 A/PROM 125/125 AROM  Knee extension -10 AROM 0 PROM -34 AROM 0 PROM -5/0 AROM  Ankle dorsiflexion  Ankle plantarflexion     Ankle inversion     Ankle eversion      (Blank rows = not tested)  LOWER EXTREMITY MMT:  MMT Right eval Left eval  Hip flexion 4- 4-  Hip extension 3- 3-  Hip abduction 3 3-  Hip adduction    Hip internal rotation    Hip external rotation    Knee flexion 4 4  Knee extension 3+ 3+  Ankle dorsiflexion    Ankle plantarflexion    Ankle inversion    Ankle eversion     (Blank rows = not tested)   FUNCTIONAL TESTS:  5 times sit to stand: 17.79 sec with UE support Tandem stance 30 sec bilat but with increased sway and hip strategy SLS: 10 sec L, 3 sec R  5x STS (12/02/22): 16.73 sec without UE support SLS (12/02/22): 3 sec Lt, 3 sec Rt  GAIT: Distance walked: 64' Assistive device  utilized: None Level of assistance: Complete Independence Comments: Slightly flexed trunk, wide BOS  [facility] Adult PT Treatment:                                                DATE: 12/16/22 Therapeutic Exercise: Nustep L6 x 5 min warm up Seated hamstring stretch x 30 sec R&L Seated SLR 2x10 R&L (no pain noted) Supine hip flexor stretch 2x30 sec Bridge x10, bridge + marching x10 Sidelying hip abduction red TB 2x10 Prone hip ext red TB 2x10 Prone hamstring curl red TB 2x10 Side step red TB 2x10 Backwards step red TB 2x10   [facility] Adult PT Treatment:                                                DATE: 12/02/22 Therapeutic Exercise: Nustep L6 x 5 min warm up 5x STS 16.73 sec SLS 2 x 30 sec bilat with UE support Leg press DL 45# 2 x 10 bilat TKE 2 x 10 ball on wall Standing heel raise 2 x 10 SLR small range parallel, ER, IR x10 each Hip adduction isometric 10 x 5 sec Hooklying hip abduction 2 x 10 green TB Sidelying clam yellow TB 2 x 10 bilat Sidelying hip abd yellow TB 2 x 10 Prone hip ext yellow TB 2 x 10 Prone HS curl yellow TB 2 x10    PATIENT EDUCATION:  Education details: Progress HEP Person educated: Patient Education method: Explanation, Demonstration, and Handouts Education comprehension: verbalized understanding, returned demonstration, and needs further education  HOME EXERCISE PROGRAM: Access Code: [email or web address] URL: [email or web address] Date: 12/02/2022 Prepared by: Isabelle Course  Exercises - Supine Quadricep Sets  - 1 x daily - 7 x weekly - 2 sets - 10 reps - 3 sec hold - Supine Heel Slide  - 1 x daily - 7 x weekly - 2 sets - 10 reps - Supine Bridge  - 1 x daily - 7 x weekly - 2 sets - 10 reps - Clam with Resistance  - 1 x daily - 7 x weekly - 3 sets - 10 reps - Sidelying Reverse Clamshell with Resistance  - 1 x daily - 7 x weekly - 3 sets - 10 reps - Sitting Knee Extension with Resistance  - 1 x  daily - 7 x weekly - 3 sets - 10 reps - Sit to  Stand with Counter Support  - 1 x daily - 7 x weekly - 2 sets - 5 reps - Prone Quadriceps Stretch with Strap  - 1 x daily - 7 x weekly - 3 sets - 10 reps - Small Range Straight Leg Raise  - 1 x daily - 7 x weekly - 3 sets - 10 reps - Supine Hip Adduction Isometric with Ball  - 1 x daily - 7 x weekly - 3 sets - 10 reps - 5 sec hold - Standing 3-Way Leg Reach with Resistance at Ankles and Counter Support  - 1 x daily - 7 x weekly - 3 sets - 10 reps - Prone Hip Extension  - 1 x daily - 7 x weekly - 3 sets - 10 reps - Prone Knee Flexion  - 1 x daily - 7 x weekly - 3 sets - 10 reps   ASSESSMENT:  CLINICAL IMPRESSION: Able to better tolerate SLR in sitting this session. Continued to work on progressing bilat LE strengthening.   OBJECTIVE IMPAIRMENTS: Abnormal gait, decreased activity tolerance, decreased balance, decreased endurance, decreased mobility, difficulty walking, decreased ROM, decreased strength, impaired flexibility, and pain.    GOALS: Goals reviewed with patient? Yes  SHORT TERM GOALS: Target date: 12/02/2022   Patient will be independent with initial HEP. Baseline:  Goal status: MET   LONG TERM GOALS: Target date: 12/30/2022  Patient will be independent with advanced/ongoing HEP to improve outcomes and carryover.  Baseline:  Goal status: INITIAL  2.  Patient will report at least 50% improvement in bilat knee pain to improve QOL. Baseline:  Goal status: INITIAL  3.  Patient will be able to perform SLS x 20 sec to demo increased LE stability and endurance. Baseline:  Goal status: INITIAL  4.  Patient will demonstrate improved functional LE strength as demonstrated by 5x STS </=13 sec without UE support. Baseline:  Goal status: IN PROGRESS  5.  Patient will be able to ambulate 1000' with LRAD and normal gait pattern without increased pain to access community.  Baseline:  Goal status: INITIAL  6. Patient will be able to ascend/descend stairs with 1 HR and reciprocal  step pattern safely to access home and community.  Baseline:  Goal status: INITIAL    PLAN:  PT FREQUENCY: 2x/week with plans to decrease to 1x/wk  PT DURATION: 8 weeks  PLANNED INTERVENTIONS: Therapeutic exercises, Therapeutic activity, Neuromuscular re-education, Balance training, Gait training, Patient/Family education, Self Care, Joint mobilization, Stair training, Aquatic Therapy, Dry Needling, Electrical stimulation, Cryotherapy, Moist heat, Taping, Vasopneumatic device, Ionotophoresis 3m/ml Dexamethasone, Manual therapy, and Re-evaluation  PLAN FOR NEXT SESSION: Plan for d/c. Re-check strength. Discuss post-op PT plans (wants to get both knees done this year but has visit limitations from insurance). Update HEP. Progress exercises as able; focus on quad strengthening   Chesley Veasey April Ma L [place] [facility] 12/16/2022, 2:04 PM

## 2022-12-30 ENCOUNTER — Ambulatory Visit: Payer: 59 | Attending: Orthopedic Surgery | Admitting: Physical Therapy

## 2022-12-30 ENCOUNTER — Encounter: Payer: Self-pay | Admitting: Physical Therapy

## 2022-12-30 DIAGNOSIS — M6281 Muscle weakness (generalized): Secondary | ICD-10-CM | POA: Insufficient documentation

## 2022-12-30 DIAGNOSIS — G8929 Other chronic pain: Secondary | ICD-10-CM | POA: Insufficient documentation

## 2022-12-30 DIAGNOSIS — R2689 Other abnormalities of gait and mobility: Secondary | ICD-10-CM | POA: Insufficient documentation

## 2022-12-30 DIAGNOSIS — M25561 Pain in right knee: Secondary | ICD-10-CM | POA: Insufficient documentation

## 2022-12-30 DIAGNOSIS — M25562 Pain in left knee: Secondary | ICD-10-CM | POA: Insufficient documentation

## 2022-12-30 NOTE — Therapy (Signed)
OUTPATIENT PHYSICAL THERAPY LOWER EXTREMITY TREATMENT and DISCHARGE   Patient Name: Lisa Everett MRN: [id number] DOB:1966-05-25, 57 y.o., female Today's Date: 12/30/2022  END OF SESSION:  PT End of Session - 12/30/22 1350     Visit Number 9    Number of Visits 16    Date for PT Re-Evaluation 12/30/22    PT Start Time 1315    PT Stop Time 1353    PT Time Calculation (min) 38 min    Activity Tolerance Patient tolerated treatment well    Behavior During Therapy WFL for tasks assessed/performed                 Past Medical History:  Diagnosis Date   Allergy    pet dander   Asthma    Family history of adverse reaction to anesthesia    Sister has naseau and vomiting   Hot flashes    Hypertension    Hypokalemia    Vitamin D deficiency    Past Surgical History:  Procedure Laterality Date   CESAREAN SECTION     HERNIA REPAIR     x3 3rd with Dr. Kieth Brightly 07-02-18   [place] N/A 07/02/2018   Procedure: HERNIA REPAIR INCISIONAL OPEN RECURRENT;  Surgeon: Kinsinger, Arta Bruce, MD;  Location: [facility];  Service: General;  Laterality: N/A;   INSERTION OF MESH N/A 07/02/2018   Procedure: INSERTION OF MESH;  Surgeon: Kieth Brightly Arta Bruce, MD;  Location: [facility];  Service: General;  Laterality: N/A;   LAPAROTOMY N/A 07/03/2018   Procedure: EXPLORATORY LAPAROTOMY, RE-OPENING OF INCISION AND REMOVAL OF HEMATOMA;  Surgeon: Kieth Brightly, Arta Bruce, MD;  Location: [facility];  Service: General;  Laterality: N/A;   WISDOM TOOTH EXTRACTION     Patient Active Problem List   Diagnosis Date Noted   Incisional hernia 07/02/2018    PCP: n/a  REFERRING PROVIDER: Gaynelle Arabian, MD  REFERRING DIAG: M25.562 (ICD-10-CM) - Pain in left knee  THERAPY DIAG:  Chronic pain of left knee  Chronic pain of right knee  Muscle weakness (generalized)  Other abnormalities of gait and mobility  Rationale for Evaluation and Treatment: Rehabilitation  ONSET DATE:  Years  SUBJECTIVE:   SUBJECTIVE STATEMENT: Pt states she feels "ok". She is ready to d/c to HEP after today in preparation for upcoming knee surgery  PERTINENT HISTORY: Chronic knee OA  PAIN:  Are you having pain? Yes: NPRS scale: 3/10 Pain location: Medial knee Pain description: tight Aggravating factors: Cold weather, rain, being on my feet a lot, stairs Relieving factors: Heat/ice, pain medication  PRECAUTIONS: None  WEIGHT BEARING RESTRICTIONS: No  FALLS:  Has patient fallen in last 6 months? No  LIVING ENVIRONMENT: Lives with: lives with their family Lives in: House/apartment Stairs: Yes: Internal: 20 steps; on right going up Has following equipment at home: None  OCCUPATION: X-ray tech  PLOF: Independent  PATIENT GOALS: Improve strength to ready for surgery  NEXT MD VISIT:   OBJECTIVE:   PATIENT SURVEYS:  FOTO TBA  LOWER EXTREMITY ROM:  ROM Right eval Left eval R/L 11/18/22  Hip flexion     Hip extension     Hip abduction     Hip adduction     Hip internal rotation     Hip external rotation     Knee flexion 128 A/PROM 120 A/PROM 125/125 AROM  Knee extension -10 AROM 0 PROM -34 AROM 0 PROM -5/0 AROM  Ankle dorsiflexion     Ankle plantarflexion  Ankle inversion     Ankle eversion      (Blank rows = not tested)  LOWER EXTREMITY MMT:  MMT Right eval Left eval  Hip flexion 4- 4-  Hip extension 3- 3-  Hip abduction 3 3-  Hip adduction    Hip internal rotation    Hip external rotation    Knee flexion 4 4  Knee extension 3+ 3+  Ankle dorsiflexion    Ankle plantarflexion    Ankle inversion    Ankle eversion     (Blank rows = not tested)   FUNCTIONAL TESTS:  5 times sit to stand: 17.79 sec with UE support Tandem stance 30 sec bilat but with increased sway and hip strategy SLS: 10 sec L, 3 sec R  5x STS (12/02/22): 16.73 sec without UE support  (12/30/22) 13.44 sec SLS (12/02/22): 3 sec Lt, 3 sec Rt  (3/5): Rt 7 sec, Lt 15  sec  GAIT: Distance walked: 74' Assistive device utilized: None Level of assistance: Complete Independence Comments: Slightly flexed trunk, wide BOS  [facility] Adult PT Treatment:                                                DATE: 12/30/22 Therapeutic Exercise: Nustep L6 x 5 min warm up SLS bilat x 20 seconds intermittent UE support Quad set 10 x 3 sec hold SLR 2 x 10 bilat Bridge 2 x 10 Sidelying clam shell red TB 2 x 10 bilat Prone HS curl red TB 2 x 10 Prone hip ext red TB 2 x 10  Therapeutic Activity: Education on HEP and progression of exercises, plan for d/c, recommendations for continued activity    [facility] Adult PT Treatment:                                                DATE: 12/16/22 Therapeutic Exercise: Nustep L6 x 5 min warm up Seated hamstring stretch x 30 sec R&L Seated SLR 2x10 R&L (no pain noted) Supine hip flexor stretch 2x30 sec Bridge x10, bridge + marching x10 Sidelying hip abduction red TB 2x10 Prone hip ext red TB 2x10 Prone hamstring curl red TB 2x10 Side step red TB 2x10 Backwards step red TB 2x10   PATIENT EDUCATION:  Education details: Progress HEP Person educated: Patient Education method: Explanation, Demonstration, and Handouts Education comprehension: verbalized understanding, returned demonstration, and needs further education  HOME EXERCISE PROGRAM: Access Code: [email or web address] URL: [email or web address] Date: 12/02/2022 Prepared by: Isabelle Course  Exercises - Supine Quadricep Sets  - 1 x daily - 7 x weekly - 2 sets - 10 reps - 3 sec hold - Supine Heel Slide  - 1 x daily - 7 x weekly - 2 sets - 10 reps - Supine Bridge  - 1 x daily - 7 x weekly - 2 sets - 10 reps - Clam with Resistance  - 1 x daily - 7 x weekly - 3 sets - 10 reps - Sidelying Reverse Clamshell with Resistance  - 1 x daily - 7 x weekly - 3 sets - 10 reps - Sitting Knee Extension with Resistance  - 1 x daily - 7 x weekly - 3 sets - 10 reps - Sit to Stand with  Counter Support  - 1 x daily - 7 x weekly - 2 sets - 5 reps - Prone Quadriceps Stretch with Strap  - 1 x daily - 7 x weekly - 3 sets - 10 reps - Small Range Straight Leg Raise  - 1 x daily - 7 x weekly - 3 sets - 10 reps - Supine Hip Adduction Isometric with Ball  - 1 x daily - 7 x weekly - 3 sets - 10 reps - 5 sec hold - Standing 3-Way Leg Reach with Resistance at Ankles and Counter Support  - 1 x daily - 7 x weekly - 3 sets - 10 reps - Prone Hip Extension  - 1 x daily - 7 x weekly - 3 sets - 10 reps - Prone Knee Flexion  - 1 x daily - 7 x weekly - 3 sets - 10 reps   ASSESSMENT:  CLINICAL IMPRESSION: Pt has progressed towards goals and has demo'd increased strength and balance. She feels confident with HEP and is ready for d/c at this time  OBJECTIVE IMPAIRMENTS: Abnormal gait, decreased activity tolerance, decreased balance, decreased endurance, decreased mobility, difficulty walking, decreased ROM, decreased strength, impaired flexibility, and pain.    GOALS: Goals reviewed with patient? Yes  SHORT TERM GOALS: Target date: 12/02/2022   Patient will be independent with initial HEP. Baseline:  Goal status: MET   LONG TERM GOALS: Target date: 12/30/2022  Patient will be independent with advanced/ongoing HEP to improve outcomes and carryover.  Baseline:  Goal status: MET  2.  Patient will report at least 50% improvement in bilat knee pain to improve QOL. Baseline:  Goal status: MET  3.  Patient will be able to perform SLS x 20 sec to demo increased LE stability and endurance. Baseline:  Goal status: IN PROGRESS  4.  Patient will demonstrate improved functional LE strength as demonstrated by 5x STS </=13 sec without UE support. Baseline:  Goal status: MET  5.  Patient will be able to ambulate 1000' with LRAD and normal gait pattern without increased pain to access community.  Baseline:  Goal status: IN PROGRESS  6. Patient will be able to ascend/descend stairs with 1 HR  and reciprocal step pattern safely to access home and community.  Baseline:  Goal status: IN PROGRESS (able to ascend with reciprocal pattern, step to pattern to descend)    PLAN:  PT FREQUENCY: 2x/week with plans to decrease to 1x/wk  PT DURATION: 8 weeks  PLANNED INTERVENTIONS: Therapeutic exercises, Therapeutic activity, Neuromuscular re-education, Balance training, Gait training, Patient/Family education, Self Care, Joint mobilization, Stair training, Aquatic Therapy, Dry Needling, Electrical stimulation, Cryotherapy, Moist heat, Taping, Vasopneumatic device, Ionotophoresis '4mg'$ /ml Dexamethasone, Manual therapy, and Re-evaluation  PLAN FOR NEXT SESSION: d/c  PHYSICAL THERAPY DISCHARGE SUMMARY  Visits from Start of Care: 9  Current functional level related to goals / functional outcomes: Improved strength and balance   Remaining deficits: Gait, increased pain   Education / Equipment: HEP   Patient agrees to discharge. Patient goals were partially met. Patient is being discharged due to being pleased with the current functional level.    Berman Grainger, PT 12/30/2022, 1:50 PM

## 2023-02-05 NOTE — H&P (Signed)
TOTAL KNEE ADMISSION H&P  Patient is being admitted for left total knee arthroplasty.  Subjective:  Chief Complaint: Left knee pain.  HPI: Lisa Everett, 57 y.o. female has a history of pain and functional disability in the left knee due to arthritis and has failed non-surgical conservative treatments for greater than 12 weeks to include NSAID's and/or analgesics, corticosteriod injections, and activity modification. Onset of symptoms was gradual, starting  several  years ago with gradually worsening course since that time. The patient noted no past surgery on the left knee.  Patient currently rates pain in the left knee at 7 out of 10 with activity. Patient has night pain, worsening of pain with activity and weight bearing, pain with passive range of motion, and crepitus. Patient has evidence of  tricompartmental bone on bone changes in both knees, slightly worse on the left than the right  by imaging studies. There is no active infection.  Patient Active Problem List   Diagnosis Date Noted   Incisional hernia 07/02/2018    Past Medical History:  Diagnosis Date   Allergy    pet dander   Asthma    Family history of adverse reaction to anesthesia    Sister has naseau and vomiting   Hot flashes    Hypertension    Hypokalemia    Vitamin D deficiency     Past Surgical History:  Procedure Laterality Date   CESAREAN SECTION     HERNIA REPAIR     x3 3rd with Dr. Sheliah Hatch 07-02-18   INCISIONAL HERNIA REPAIR N/A 07/02/2018   Procedure: HERNIA REPAIR INCISIONAL OPEN RECURRENT;  Surgeon: Kinsinger, De Blanch, MD;  Location: [facility];  Service: General;  Laterality: N/A;   INSERTION OF MESH N/A 07/02/2018   Procedure: INSERTION OF MESH;  Surgeon: Sheliah Hatch De Blanch, MD;  Location: [facility];  Service: General;  Laterality: N/A;   LAPAROTOMY N/A 07/03/2018   Procedure: EXPLORATORY LAPAROTOMY, RE-OPENING OF INCISION AND REMOVAL OF HEMATOMA;  Surgeon: Sheliah Hatch De Blanch, MD;  Location: [facility];  Service: General;  Laterality: N/A;   WISDOM TOOTH EXTRACTION      Prior to Admission medications   Medication Sig Start Date End Date Taking? Authorizing Provider  albuterol (VENTOLIN HFA) 108 (90 Base) MCG/ACT inhaler  12/08/19   [provider]  aspirin 81 MG tablet Take 81 mg by mouth daily.    [provider]  augmented betamethasone dipropionate (DIPROLENE-AF) 0.05 % cream APPLY TO THE AFFECTED AREA(S) TWO TIMES DAILY Patient not taking: Reported on 11/04/2022 02/21/20   [provider]  benzonatate (TESSALON) 100 MG capsule Take by mouth 3 (three) times daily as needed for cough.    [provider]  cetirizine (ZYRTEC) 10 MG tablet Take 10 mg by mouth at bedtime.     [provider]  fluticasone (FLONASE) 50 MCG/ACT nasal spray Place 1-2 sprays into both nostrils daily for 7 days. Patient not taking: Reported on 06/23/2018 02/18/18 06/23/18  Wieters, Hallie C, PA-C  Glucosamine HCl (GLUCOSAMINE PO) Take 1 tablet by mouth daily.    [provider]  ibuprofen (ADVIL) 800 MG tablet Take 800 mg by mouth 3 (three) times daily. 11/15/20   [provider]  lisinopril-hydrochlorothiazide (PRINZIDE,ZESTORETIC) 20-25 MG tablet Take 1 tablet by mouth daily.    [provider]  lisinopril-hydrochlorothiazide (ZESTORETIC) 20-12.5 MG tablet TAKE 1 TABLET BY MOUTH DAILY 08/30/20 08/30/21  Simone Curia, MD  meloxicam (MOBIC) 15 MG tablet Take 15 mg by mouth  daily.    [provider]  potassium chloride (MICRO-K) 10 MEQ CR capsule Take 20 mEq by mouth daily.     [provider]  potassium chloride SA (KLOR-CON M20) 20 MEQ tablet Klor-Con M20 mEq tablet,extended release  TAKE 1 TABLET BY MOUTH THREE TIMES A DAY 11/27/20   [provider]  vitamin B-12 (CYANOCOBALAMIN) 500 MCG tablet Take 500 mcg by mouth daily.     [provider]  Vitamin D, Ergocalciferol, (DRISDOL) 50000 units CAPS capsule Take 50,000  Units by mouth once a week. 06/07/18   [provider]    Allergies  Allergen Reactions   Other     Pet dandruff -- pt stated, "My eyes swelled, itching and redness"    Social History   Socioeconomic History   Marital status: Single    Spouse name: Not on file   Number of children: Not on file   Years of education: Not on file   Highest education level: Not on file  Occupational History   Not on file  Tobacco Use   Smoking status: Never   Smokeless tobacco: Never  Vaping Use   Vaping Use: Never used  Substance and Sexual Activity   Alcohol use: No   Drug use: No   Sexual activity: Yes    Birth control/protection: I.U.D.  Other Topics Concern   Not on file  Social History Narrative   Not on file   Social Determinants of Health   Financial Resource Strain: Not on file  Food Insecurity: Not on file  Transportation Needs: Not on file  Physical Activity: Not on file  Stress: Not on file  Social Connections: Not on file  Intimate Partner Violence: Not on file    Tobacco Use: Low Risk  (12/30/2022)   Patient History    Smoking Tobacco Use: Never    Smokeless Tobacco Use: Never    Passive Exposure: Not on file   Social History   Substance and Sexual Activity  Alcohol Use No    Family History  Problem Relation Age of Onset   Diabetes Mother    Hypertension Mother    Hypertension Father    Osteoarthritis Sister    Diabetes Paternal Grandmother    Diabetes Paternal Aunt     ROS  Objective:  Physical Exam: Well nourished and well developed.  General: Alert and oriented x3, cooperative and pleasant, no acute distress.  Head: normocephalic, atraumatic, neck supple.  Eyes: EOMI.  Musculoskeletal:  Left Knee Exam:  No effusion present. No swelling present.  The Range of motion is: 15 to 120 degrees.  Marked crepitus on range of motion of the knee.  Positive medial greater than lateral joint line tenderness.  The knee is stable.  Calves soft  and nontender. Motor function intact in LE. Strength 5/5 LE bilaterally. Neuro: Distal pulses 2+. Sensation to light touch intact in LE.   Imaging Review Plain radiographs demonstrate severe degenerative joint disease of the left knee. The overall alignment is neutral. The bone quality appears to be adequate for age and reported activity level.  Assessment/Plan:  End stage arthritis, left knee   The patient history, physical examination, clinical judgment of the provider and imaging studies are consistent with end stage degenerative joint disease of the left knee and total knee arthroplasty is deemed medically necessary. The treatment options including medical management, injection therapy arthroscopy and arthroplasty were discussed at length. The risks and benefits of total knee arthroplasty were presented and reviewed.  The risks due to aseptic loosening, infection, stiffness, patella tracking problems, thromboembolic complications and other imponderables were discussed. The patient acknowledged the explanation, agreed to proceed with the plan and consent was signed. Patient is being admitted for inpatient treatment for surgery, pain control, PT, OT, prophylactic antibiotics, VTE prophylaxis, progressive ambulation and ADLs and discharge planning. The patient is planning to be discharged  home .   Patient's anticipated LOS is less than 2 midnights, meeting these requirements: - Younger than 665 - Lives within 1 hour of care - Has a competent adult at home to recover with post-op recover - NO history of  - Chronic pain requiring opiods  - Diabetes  - Coronary Artery Disease  - Heart failure  - Heart attack  - Stroke  - DVT/VTE  - Cardiac arrhythmia  - Respiratory Failure/COPD  - Renal failure  - Anemia  - Advanced Liver disease  Therapy Plans: [facility]([place]) Disposition: Home with friend Planned DVT Prophylaxis: Aspirin 81 mg BID DME Needed: None PCP:  Simone CuriaKeung Lee, MD (clearance received) TXA: IV Allergies: NKDA Anesthesia Concerns: None BMI: 33.3 Last HgbA1c: Not diabetic.  Pharmacy: [facility]([address])  Other: On chronic tramadol   - Patient was instructed on what medications to stop prior to surgery. - Follow-up visit in 2 weeks with Dr. Lequita HaltAluisio - Begin physical therapy following surgery - Pre-operative lab work as pre-surgical testing - Prescriptions will be provided in hospital at time of discharge  Arther AbbottKristie Criss Pallone, PA-C Orthopedic Surgery [facility] [place]

## 2023-02-16 NOTE — Patient Instructions (Addendum)
SURGICAL WAITING ROOM VISITATION  Patients having surgery or a procedure may have no more than 2 support people in the waiting area - these visitors may rotate.    Children under the age of 65 must have an adult with them who is not the patient.  Due to an increase in RSV and influenza rates and associated hospitalizations, children ages 59 and under may not visit patients in [facility] hospitals.  If the patient needs to stay at the hospital during part of their recovery, the visitor guidelines for inpatient rooms apply. Pre-op nurse will coordinate an appropriate time for 1 support person to accompany patient in pre-op.  This support person may not rotate.    Please refer to the [facility] website for the visitor guidelines for Inpatients (after your surgery is over and you are in a regular room).    Your procedure is scheduled on: 03/09/23   Report to [facility] Main Entrance    Report to admitting at 5:50 AM   Call this number if you have problems the morning of surgery [phone]   Do not eat food :After Midnight.   After Midnight you may have the following liquids until 5:20 AM DAY OF SURGERY  Water Non-Citrus Juices (without pulp, NO RED-Apple, White grape, White cranberry) Black Coffee (NO MILK/CREAM OR CREAMERS, sugar ok)  Clear Tea (NO MILK/CREAM OR CREAMERS, sugar ok) regular and decaf                             Plain Jell-O (NO RED)                                           Fruit ices (not with fruit pulp, NO RED)                                     Popsicles (NO RED)                                                               Sports drinks like Gatorade (NO RED)    The day of surgery:  Drink ONE (1) Pre-Surgery Clear Ensure at 5:20 AM the morning of surgery. Drink in one sitting. Do not sip.  This drink was given to you during your hospital  pre-op appointment visit. Nothing else to drink after completing the  Pre-Surgery Clear Ensure         If you  have questions, please contact your surgeon's office.   FOLLOW BOWEL PREP AND ANY ADDITIONAL PRE OP INSTRUCTIONS YOU RECEIVED FROM YOUR SURGEON'S OFFICE!!!     Oral Hygiene is also important to reduce your risk of infection.                                    Remember - BRUSH YOUR TEETH THE MORNING OF SURGERY WITH YOUR REGULAR TOOTHPASTE  DENTURES WILL BE REMOVED PRIOR TO SURGERY PLEASE DO NOT APPLY "Poly grip" OR ADHESIVES!!!  Take these medicines the morning of surgery with A SIP OF WATER: Inhaler, nasal spray                              You may not have any metal on your body including hair pins, jewelry, and body piercing             Do not wear make-up, lotions, powders, perfumes, or deodorant  Do not wear nail polish including gel and S&S, artificial/acrylic nails, or any other type of covering on natural nails including finger and toenails. If you have artificial nails, gel coating, etc. that needs to be removed by a nail salon please have this removed prior to surgery or surgery may need to be canceled/ delayed if the surgeon/ anesthesia feels like they are unable to be safely monitored.   Do not shave  48 hours prior to surgery.    Do not bring valuables to the hospital. [facility] IS NOT             RESPONSIBLE   FOR VALUABLES.   Contacts, glasses, dentures or bridgework may not be worn into surgery.  DO NOT BRING YOUR HOME MEDICATIONS TO THE HOSPITAL. PHARMACY WILL DISPENSE MEDICATIONS LISTED ON YOUR MEDICATION LIST TO YOU DURING YOUR ADMISSION IN THE HOSPITAL!    Patients discharged on the day of surgery will not be allowed to drive home.  Someone NEEDS to stay with you for the first 24 hours after anesthesia.              Please read over the following fact sheets you were given: IF YOU HAVE QUESTIONS ABOUT YOUR PRE-OP INSTRUCTIONS PLEASE CALL [phone] [place]    If you received a COVID test during your pre-op visit  it is requested that you wear a mask when out  in public, stay away from anyone that may not be feeling well and notify your surgeon if you develop symptoms. If you test positive for Covid or have been in contact with anyone that has tested positive in the last 10 days please notify you surgeon.  FAILURE TO FOLLOW THESE INSTRUCTIONS MAY RESULT IN THE CANCELLATION OF YOUR SURGERY  PATIENT SIGNATURE_________________________________  NURSE SIGNATURE__________________________________

## 2023-02-16 NOTE — Progress Notes (Addendum)
COVID Vaccine Completed: yes  Date of COVID positive in last 90 days: no  PCP - Simone Curia, MD Cardiologist - n/a  Medical/cardiac clearance by Simone Curia 01/22/23 on chart  Chest x-ray - n/a EKG - 01/23/23 on chart Stress Test - n/a ECHO - a year ago at [facility] medical Cardiac Cath - n/a Pacemaker/ICD device last checked: n/a Spinal Cord Stimulator: n/a  Bowel Prep - no  Sleep Study - no CPAP -   Fasting Blood Sugar - pre DM Checks Blood Sugar _____ times a day  Last dose of GLP1 agonist-  N/A GLP1 instructions:  N/A   Last dose of SGLT-2 inhibitors-  N/A SGLT-2 instructions: N/A   Blood Thinner Instructions:  Time Aspirin Instructions: ASA 81, hold 7 days before Last Dose:  Activity level: Can go up a flight of stairs and perform activities of daily living without stopping and without symptoms of chest pain or shortness of breath.   Anesthesia review:   Patient denies shortness of breath, fever, cough and chest pain at PAT appointment  Patient verbalized understanding of instructions that were given to them at the PAT appointment. Patient was also instructed that they will need to review over the PAT instructions again at home before surgery.

## 2023-02-24 ENCOUNTER — Encounter (HOSPITAL_COMMUNITY)
Admission: RE | Admit: 2023-02-24 | Discharge: 2023-02-24 | Disposition: A | Discharge status: Home / Self Care | Payer: 59 | Source: Ambulatory Visit | Attending: Orthopedic Surgery | Admitting: Orthopedic Surgery

## 2023-02-24 ENCOUNTER — Encounter (HOSPITAL_COMMUNITY): Payer: Self-pay

## 2023-02-24 ENCOUNTER — Other Ambulatory Visit: Payer: Self-pay

## 2023-02-24 VITALS — BP 111/87 | HR 86 | Temp 98.4°F | Resp 16 | Ht 62.0 in | Wt 197.0 lb

## 2023-02-24 DIAGNOSIS — I10 Essential (primary) hypertension: Secondary | ICD-10-CM | POA: Insufficient documentation

## 2023-02-24 DIAGNOSIS — Z01812 Encounter for preprocedural laboratory examination: Secondary | ICD-10-CM | POA: Diagnosis present

## 2023-02-24 DIAGNOSIS — R7303 Prediabetes: Secondary | ICD-10-CM

## 2023-02-24 DIAGNOSIS — Z01818 Encounter for other preprocedural examination: Secondary | ICD-10-CM

## 2023-02-24 HISTORY — DX: Unspecified osteoarthritis, unspecified site: M19.90

## 2023-02-24 LAB — CBC
HCT: 41.4 % (ref 36.0–46.0)
Hemoglobin: 13.6 g/dL (ref 12.0–15.0)
MCH: 29.9 pg (ref 26.0–34.0)
MCHC: 32.9 g/dL (ref 30.0–36.0)
MCV: 91 fL (ref 80.0–100.0)
Platelets: 241 10*3/uL (ref 150–400)
RBC: 4.55 MIL/uL (ref 3.87–5.11)
RDW: 12.4 % (ref 11.5–15.5)
WBC: 4.1 10*3/uL (ref 4.0–10.5)
nRBC: 0 % (ref 0.0–0.2)

## 2023-02-24 LAB — SURGICAL PCR SCREEN
MRSA, PCR: NEGATIVE
Staphylococcus aureus: NEGATIVE

## 2023-02-24 LAB — BASIC METABOLIC PANEL
Anion gap: 10 (ref 5–15)
BUN: 25 mg/dL — ABNORMAL HIGH (ref 6–20)
CO2: 27 mmol/L (ref 22–32)
Calcium: 8.8 mg/dL — ABNORMAL LOW (ref 8.9–10.3)
Chloride: 99 mmol/L (ref 98–111)
Creatinine, Ser: 1.23 mg/dL — ABNORMAL HIGH (ref 0.44–1.00)
GFR, Estimated: 51 mL/min — ABNORMAL LOW (ref >60)
Glucose, Bld: 89 mg/dL (ref 70–99)
Potassium: 3.5 mmol/L (ref 3.5–5.1)
Sodium: 136 mmol/L (ref 135–145)

## 2023-02-24 LAB — HEMOGLOBIN A1C
Hgb A1c MFr Bld: 5.6 % (ref 4.8–5.6)
Mean Plasma Glucose: 114.02 mg/dL

## 2023-03-09 ENCOUNTER — Encounter (HOSPITAL_COMMUNITY): Payer: Self-pay | Admitting: Orthopedic Surgery

## 2023-03-09 ENCOUNTER — Encounter (HOSPITAL_COMMUNITY)
Admission: RE | Disposition: A | Discharge status: Home / Self Care | Payer: Self-pay | Source: Ambulatory Visit | Attending: Orthopedic Surgery

## 2023-03-09 ENCOUNTER — Ambulatory Visit (HOSPITAL_COMMUNITY): Payer: 59 | Admitting: Anesthesiology

## 2023-03-09 ENCOUNTER — Other Ambulatory Visit: Payer: Self-pay

## 2023-03-09 ENCOUNTER — Observation Stay (HOSPITAL_COMMUNITY)
Admission: RE | Admit: 2023-03-09 | Discharge: 2023-03-11 | Disposition: A | Discharge status: Home / Self Care | Payer: 59 | Source: Ambulatory Visit | Attending: Orthopedic Surgery | Admitting: Orthopedic Surgery

## 2023-03-09 ENCOUNTER — Ambulatory Visit (HOSPITAL_BASED_OUTPATIENT_CLINIC_OR_DEPARTMENT_OTHER): Payer: 59 | Admitting: Anesthesiology

## 2023-03-09 DIAGNOSIS — Z01818 Encounter for other preprocedural examination: Secondary | ICD-10-CM

## 2023-03-09 DIAGNOSIS — Z7982 Long term (current) use of aspirin: Secondary | ICD-10-CM | POA: Insufficient documentation

## 2023-03-09 DIAGNOSIS — M179 Osteoarthritis of knee, unspecified: Secondary | ICD-10-CM | POA: Diagnosis present

## 2023-03-09 DIAGNOSIS — M1712 Unilateral primary osteoarthritis, left knee: Secondary | ICD-10-CM

## 2023-03-09 DIAGNOSIS — Z79899 Other long term (current) drug therapy: Secondary | ICD-10-CM | POA: Diagnosis not present

## 2023-03-09 DIAGNOSIS — J45909 Unspecified asthma, uncomplicated: Secondary | ICD-10-CM | POA: Diagnosis not present

## 2023-03-09 DIAGNOSIS — I10 Essential (primary) hypertension: Secondary | ICD-10-CM | POA: Insufficient documentation

## 2023-03-09 HISTORY — PX: TOTAL KNEE ARTHROPLASTY: SHX125

## 2023-03-09 SURGERY — ARTHROPLASTY, KNEE, TOTAL
Anesthesia: Spinal | Site: Knee | Laterality: Left

## 2023-03-09 MED ORDER — STERILE WATER FOR IRRIGATION IR SOLN
Status: DC | PRN
  Administered 2023-03-09: 2000 mL

## 2023-03-09 MED ORDER — GABAPENTIN 300 MG PO CAPS
300.0000 mg | ORAL_CAPSULE | Freq: Three times a day (TID) | ORAL | Status: DC
  Administered 2023-03-09 – 2023-03-11 (×6): 300 mg via ORAL
  Filled 2023-03-09 (×6): qty 1

## 2023-03-09 MED ORDER — ASPIRIN 81 MG PO CHEW
81.0000 mg | CHEWABLE_TABLET | Freq: Two times a day (BID) | ORAL | Status: DC
  Administered 2023-03-10 – 2023-03-11 (×3): 81 mg via ORAL
  Filled 2023-03-09 (×3): qty 1

## 2023-03-09 MED ORDER — METHOCARBAMOL 500 MG IVPB - SIMPLE MED: 500.0000 mg | Freq: Four times a day (QID) | INTRAVENOUS | Status: DC | PRN

## 2023-03-09 MED ORDER — BUPIVACAINE IN DEXTROSE 0.75-8.25 % IT SOLN
INTRATHECAL | Status: DC | PRN
  Administered 2023-03-09: 1.6 mL via INTRATHECAL

## 2023-03-09 MED ORDER — CHLORHEXIDINE GLUCONATE 0.12 % MT SOLN
15.0000 mL | Freq: Once | OROMUCOSAL | Status: AC
  Administered 2023-03-09: 15 mL via OROMUCOSAL

## 2023-03-09 MED ORDER — PHENYLEPHRINE 80 MCG/ML (10ML) SYRINGE FOR IV PUSH (FOR BLOOD PRESSURE SUPPORT)
PREFILLED_SYRINGE | INTRAVENOUS | Status: AC
  Filled 2023-03-09: qty 10

## 2023-03-09 MED ORDER — ONDANSETRON HCL 4 MG/2ML IJ SOLN
INTRAMUSCULAR | Status: DC | PRN
  Administered 2023-03-09: 4 mg via INTRAVENOUS

## 2023-03-09 MED ORDER — AMISULPRIDE (ANTIEMETIC) 5 MG/2ML IV SOLN: 10.0000 mg | Freq: Once | INTRAVENOUS | Status: DC | PRN

## 2023-03-09 MED ORDER — CEFAZOLIN SODIUM-DEXTROSE 2-4 GM/100ML-% IV SOLN
2.0000 g | Freq: Four times a day (QID) | INTRAVENOUS | Status: AC
  Administered 2023-03-09 (×2): 2 g via INTRAVENOUS
  Filled 2023-03-09 (×2): qty 100

## 2023-03-09 MED ORDER — METOCLOPRAMIDE HCL 5 MG PO TABS: 5.0000 mg | ORAL_TABLET | Freq: Three times a day (TID) | ORAL | Status: DC | PRN

## 2023-03-09 MED ORDER — MIDAZOLAM HCL 2 MG/2ML IJ SOLN
1.0000 mg | Freq: Once | INTRAMUSCULAR | Status: AC
  Administered 2023-03-09: 1 mg via INTRAVENOUS
  Filled 2023-03-09: qty 2

## 2023-03-09 MED ORDER — TRAMADOL HCL 50 MG PO TABS
50.0000 mg | ORAL_TABLET | Freq: Four times a day (QID) | ORAL | Status: DC | PRN
  Administered 2023-03-09 – 2023-03-11 (×5): 100 mg via ORAL
  Filled 2023-03-09 (×5): qty 2

## 2023-03-09 MED ORDER — DOCUSATE SODIUM 100 MG PO CAPS
100.0000 mg | ORAL_CAPSULE | Freq: Two times a day (BID) | ORAL | Status: DC
  Administered 2023-03-09 – 2023-03-11 (×4): 100 mg via ORAL
  Filled 2023-03-09 (×4): qty 1

## 2023-03-09 MED ORDER — ORAL CARE MOUTH RINSE: 15.0000 mL | Freq: Once | OROMUCOSAL | Status: AC

## 2023-03-09 MED ORDER — ONDANSETRON HCL 4 MG PO TABS: 4.0000 mg | ORAL_TABLET | Freq: Four times a day (QID) | ORAL | Status: DC | PRN

## 2023-03-09 MED ORDER — DIPHENHYDRAMINE HCL 12.5 MG/5ML PO ELIX: 12.5000 mg | ORAL_SOLUTION | ORAL | Status: DC | PRN

## 2023-03-09 MED ORDER — BUPIVACAINE LIPOSOME 1.3 % IJ SUSP
INTRAMUSCULAR | Status: AC
  Filled 2023-03-09: qty 20

## 2023-03-09 MED ORDER — HYDROCHLOROTHIAZIDE 12.5 MG PO TABS
12.5000 mg | ORAL_TABLET | Freq: Every day | ORAL | Status: DC
  Administered 2023-03-10 – 2023-03-11 (×2): 12.5 mg via ORAL
  Filled 2023-03-09 (×3): qty 1

## 2023-03-09 MED ORDER — OXYCODONE HCL 5 MG PO TABS
5.0000 mg | ORAL_TABLET | ORAL | Status: DC | PRN
  Administered 2023-03-09: 5 mg via ORAL
  Administered 2023-03-09 – 2023-03-10 (×3): 10 mg via ORAL
  Filled 2023-03-09 (×2): qty 2
  Filled 2023-03-09: qty 1
  Filled 2023-03-09: qty 2

## 2023-03-09 MED ORDER — DEXAMETHASONE SODIUM PHOSPHATE 10 MG/ML IJ SOLN
INTRAMUSCULAR | Status: AC
  Filled 2023-03-09: qty 1

## 2023-03-09 MED ORDER — METHOCARBAMOL 500 MG PO TABS
500.0000 mg | ORAL_TABLET | Freq: Four times a day (QID) | ORAL | Status: DC | PRN
  Administered 2023-03-09 – 2023-03-11 (×5): 500 mg via ORAL
  Filled 2023-03-09 (×5): qty 1

## 2023-03-09 MED ORDER — SODIUM CHLORIDE 0.9 % IR SOLN
Status: DC | PRN
  Administered 2023-03-09: 1000 mL

## 2023-03-09 MED ORDER — PROMETHAZINE HCL 25 MG/ML IJ SOLN: 6.2500 mg | INTRAMUSCULAR | Status: DC | PRN

## 2023-03-09 MED ORDER — FLEET ENEMA 7-19 GM/118ML RE ENEM: 1.0000 | ENEMA | Freq: Once | RECTAL | Status: DC | PRN

## 2023-03-09 MED ORDER — ACETAMINOPHEN 500 MG PO TABS
1000.0000 mg | ORAL_TABLET | Freq: Four times a day (QID) | ORAL | Status: AC
  Administered 2023-03-09 – 2023-03-10 (×4): 1000 mg via ORAL
  Filled 2023-03-09 (×4): qty 2

## 2023-03-09 MED ORDER — MENTHOL 3 MG MT LOZG: 1.0000 | LOZENGE | OROMUCOSAL | Status: DC | PRN

## 2023-03-09 MED ORDER — BISACODYL 10 MG RE SUPP: 10.0000 mg | Freq: Every day | RECTAL | Status: DC | PRN

## 2023-03-09 MED ORDER — PHENYLEPHRINE HCL-NACL 20-0.9 MG/250ML-% IV SOLN
INTRAVENOUS | Status: AC
  Filled 2023-03-09: qty 250

## 2023-03-09 MED ORDER — POVIDONE-IODINE 10 % EX SWAB
2.0000 | Freq: Once | CUTANEOUS | Status: AC
  Administered 2023-03-09: 2 via TOPICAL

## 2023-03-09 MED ORDER — DEXAMETHASONE SODIUM PHOSPHATE 10 MG/ML IJ SOLN: 8.0000 mg | Freq: Once | INTRAMUSCULAR | Status: DC

## 2023-03-09 MED ORDER — EPHEDRINE SULFATE (PRESSORS) 50 MG/ML IJ SOLN
INTRAMUSCULAR | Status: DC | PRN
  Administered 2023-03-09 (×3): 5 mg via INTRAVENOUS

## 2023-03-09 MED ORDER — POLYETHYLENE GLYCOL 3350 17 G PO PACK
17.0000 g | PACK | Freq: Every day | ORAL | Status: DC | PRN
  Administered 2023-03-11: 17 g via ORAL
  Filled 2023-03-09: qty 1

## 2023-03-09 MED ORDER — ALBUTEROL SULFATE (2.5 MG/3ML) 0.083% IN NEBU: 3.0000 mL | INHALATION_SOLUTION | Freq: Four times a day (QID) | RESPIRATORY_TRACT | Status: DC | PRN

## 2023-03-09 MED ORDER — SODIUM CHLORIDE (PF) 0.9 % IJ SOLN
INTRAMUSCULAR | Status: AC
  Filled 2023-03-09: qty 10

## 2023-03-09 MED ORDER — HYDROMORPHONE HCL 1 MG/ML IJ SOLN
0.5000 mg | INTRAMUSCULAR | Status: DC | PRN
  Administered 2023-03-09 – 2023-03-10 (×2): 1 mg via INTRAVENOUS
  Filled 2023-03-09 (×2): qty 1

## 2023-03-09 MED ORDER — ACETAMINOPHEN 10 MG/ML IV SOLN: 1000.0000 mg | Freq: Once | INTRAVENOUS | Status: DC | PRN

## 2023-03-09 MED ORDER — SODIUM CHLORIDE 0.9 % IV SOLN: INTRAVENOUS | Status: DC

## 2023-03-09 MED ORDER — LACTATED RINGERS IV SOLN: INTRAVENOUS | Status: DC

## 2023-03-09 MED ORDER — PROPOFOL 10 MG/ML IV BOLUS
INTRAVENOUS | Status: DC | PRN
  Administered 2023-03-09: 30 mg via INTRAVENOUS

## 2023-03-09 MED ORDER — EPHEDRINE 5 MG/ML INJ
INTRAVENOUS | Status: AC
  Filled 2023-03-09: qty 5

## 2023-03-09 MED ORDER — PROPOFOL 500 MG/50ML IV EMUL
INTRAVENOUS | Status: AC
  Filled 2023-03-09: qty 50

## 2023-03-09 MED ORDER — CEFAZOLIN SODIUM-DEXTROSE 2-4 GM/100ML-% IV SOLN
2.0000 g | INTRAVENOUS | Status: AC
  Administered 2023-03-09: 2 g via INTRAVENOUS
  Filled 2023-03-09: qty 100

## 2023-03-09 MED ORDER — PHENOL 1.4 % MT LIQD: 1.0000 | OROMUCOSAL | Status: DC | PRN

## 2023-03-09 MED ORDER — BUPIVACAINE LIPOSOME 1.3 % IJ SUSP: 20.0000 mL | Freq: Once | INTRAMUSCULAR | Status: DC

## 2023-03-09 MED ORDER — ONDANSETRON HCL 4 MG/2ML IJ SOLN: 4.0000 mg | Freq: Four times a day (QID) | INTRAMUSCULAR | Status: DC | PRN

## 2023-03-09 MED ORDER — TRANEXAMIC ACID-NACL 1000-0.7 MG/100ML-% IV SOLN
1000.0000 mg | INTRAVENOUS | Status: AC
  Administered 2023-03-09: 1000 mg via INTRAVENOUS
  Filled 2023-03-09: qty 100

## 2023-03-09 MED ORDER — HYDROMORPHONE HCL 1 MG/ML IJ SOLN: 0.2500 mg | INTRAMUSCULAR | Status: DC | PRN

## 2023-03-09 MED ORDER — METOCLOPRAMIDE HCL 5 MG/ML IJ SOLN: 5.0000 mg | Freq: Three times a day (TID) | INTRAMUSCULAR | Status: DC | PRN

## 2023-03-09 MED ORDER — ACETAMINOPHEN 10 MG/ML IV SOLN
1000.0000 mg | Freq: Four times a day (QID) | INTRAVENOUS | Status: DC
  Administered 2023-03-09: 1000 mg via INTRAVENOUS
  Filled 2023-03-09: qty 100

## 2023-03-09 MED ORDER — DEXAMETHASONE SODIUM PHOSPHATE 4 MG/ML IJ SOLN
INTRAMUSCULAR | Status: DC | PRN
  Administered 2023-03-09: 8 mg via INTRAVENOUS

## 2023-03-09 MED ORDER — ONDANSETRON HCL 4 MG/2ML IJ SOLN
INTRAMUSCULAR | Status: AC
  Filled 2023-03-09: qty 2

## 2023-03-09 MED ORDER — ACETAMINOPHEN 160 MG/5ML PO SOLN: 325.0000 mg | Freq: Once | ORAL | Status: DC | PRN

## 2023-03-09 MED ORDER — POTASSIUM CHLORIDE CRYS ER 10 MEQ PO TBCR
20.0000 meq | EXTENDED_RELEASE_TABLET | Freq: Every day | ORAL | Status: DC
  Administered 2023-03-10 – 2023-03-11 (×2): 20 meq via ORAL
  Filled 2023-03-09 (×3): qty 2

## 2023-03-09 MED ORDER — SODIUM CHLORIDE (PF) 0.9 % IJ SOLN
INTRAMUSCULAR | Status: DC | PRN
  Administered 2023-03-09: 80 mL

## 2023-03-09 MED ORDER — FENTANYL CITRATE PF 50 MCG/ML IJ SOSY
50.0000 ug | PREFILLED_SYRINGE | Freq: Once | INTRAMUSCULAR | Status: AC
  Administered 2023-03-09: 50 ug via INTRAVENOUS
  Filled 2023-03-09: qty 2

## 2023-03-09 MED ORDER — ACETAMINOPHEN 325 MG PO TABS: 325.0000 mg | ORAL_TABLET | Freq: Once | ORAL | Status: DC | PRN

## 2023-03-09 MED ORDER — PHENYLEPHRINE HCL (PRESSORS) 10 MG/ML IV SOLN
INTRAVENOUS | Status: DC | PRN
  Administered 2023-03-09 (×5): 160 ug via INTRAVENOUS

## 2023-03-09 MED ORDER — 0.9 % SODIUM CHLORIDE (POUR BTL) OPTIME
TOPICAL | Status: DC | PRN
  Administered 2023-03-09: 1000 mL

## 2023-03-09 MED ORDER — PROPOFOL 500 MG/50ML IV EMUL
INTRAVENOUS | Status: DC | PRN
  Administered 2023-03-09: 100 ug/kg/min via INTRAVENOUS

## 2023-03-09 MED ORDER — ROPIVACAINE HCL 5 MG/ML IJ SOLN
INTRAMUSCULAR | Status: DC | PRN
  Administered 2023-03-09: 30 mL via PERINEURAL

## 2023-03-09 MED ORDER — DEXAMETHASONE SODIUM PHOSPHATE 10 MG/ML IJ SOLN
10.0000 mg | Freq: Once | INTRAMUSCULAR | Status: AC
  Administered 2023-03-10: 10 mg via INTRAVENOUS
  Filled 2023-03-09: qty 1

## 2023-03-09 SURGICAL SUPPLY — 59 items
ADH SKN CLS APL DERMABOND .7 (GAUZE/BANDAGES/DRESSINGS) ×1
ATTUNE PS FEM LT SZ 5 CEM KNEE (Femur) IMPLANT
ATTUNE PSRP INSR SZ5 8 KNEE (Insert) IMPLANT
BAG COUNTER SPONGE SURGICOUNT (BAG) IMPLANT
BAG SPEC THK2 15X12 ZIP CLS (MISCELLANEOUS) ×1
BAG SPNG CNTER NS LX DISP (BAG)
BAG ZIPLOCK 12X15 (MISCELLANEOUS) ×1 IMPLANT
BASEPLATE TIBIAL ROTATING SZ 4 (Knees) IMPLANT
BLADE SAG 18X100X1.27 (BLADE) ×1 IMPLANT
BLADE SAW SAGITTAL 13X90X1.07 (BLADE) IMPLANT
BNDG CMPR 5X62 HK CLSR LF (GAUZE/BANDAGES/DRESSINGS) ×1
BNDG CMPR MED 10X6 ELC LF (GAUZE/BANDAGES/DRESSINGS) ×1
BNDG ELASTIC 6INX 5YD STR LF (GAUZE/BANDAGES/DRESSINGS) ×1 IMPLANT
BNDG ELASTIC 6X10 VLCR STRL LF (GAUZE/BANDAGES/DRESSINGS) IMPLANT
BOWL SMART MIX CTS (DISPOSABLE) ×1 IMPLANT
BSPLAT TIB 4 CMNT ROT PLAT STR (Knees) ×1 IMPLANT
CEMENT HV SMART SET (Cement) ×2 IMPLANT
COVER SURGICAL LIGHT HANDLE (MISCELLANEOUS) ×1 IMPLANT
CUFF TOURN SGL QUICK 34 (TOURNIQUET CUFF) ×1
CUFF TRNQT CYL 34X4.125X (TOURNIQUET CUFF) ×1 IMPLANT
DERMABOND ADVANCED .7 DNX12 (GAUZE/BANDAGES/DRESSINGS) ×1 IMPLANT
DRAPE INCISE IOBAN 66X45 STRL (DRAPES) ×1 IMPLANT
DRAPE U-SHAPE 47X51 STRL (DRAPES) ×1 IMPLANT
DRSG AQUACEL AG ADV 3.5X10 (GAUZE/BANDAGES/DRESSINGS) ×1 IMPLANT
DURAPREP 26ML APPLICATOR (WOUND CARE) ×1 IMPLANT
ELECT REM PT RETURN 15FT ADLT (MISCELLANEOUS) ×1 IMPLANT
GLOVE BIO SURGEON STRL SZ 6.5 (GLOVE) IMPLANT
GLOVE BIO SURGEON STRL SZ7.5 (GLOVE) IMPLANT
GLOVE BIO SURGEON STRL SZ8 (GLOVE) ×1 IMPLANT
GLOVE BIOGEL PI IND STRL 6.5 (GLOVE) IMPLANT
GLOVE BIOGEL PI IND STRL 7.0 (GLOVE) IMPLANT
GLOVE BIOGEL PI IND STRL 8 (GLOVE) ×1 IMPLANT
GOWN STRL REUS W/ TWL LRG LVL3 (GOWN DISPOSABLE) ×1 IMPLANT
GOWN STRL REUS W/ TWL XL LVL3 (GOWN DISPOSABLE) IMPLANT
GOWN STRL REUS W/TWL LRG LVL3 (GOWN DISPOSABLE) ×1
GOWN STRL REUS W/TWL XL LVL3 (GOWN DISPOSABLE)
HANDPIECE INTERPULSE COAX TIP (DISPOSABLE) ×1
HOLDER FOLEY CATH W/STRAP (MISCELLANEOUS) IMPLANT
IMMOBILIZER KNEE 20 (SOFTGOODS) ×1
IMMOBILIZER KNEE 20 THIGH 36 (SOFTGOODS) ×1 IMPLANT
KIT TURNOVER KIT A (KITS) IMPLANT
MANIFOLD NEPTUNE II (INSTRUMENTS) ×1 IMPLANT
NS IRRIG 1000ML POUR BTL (IV SOLUTION) ×1 IMPLANT
PACK TOTAL KNEE CUSTOM (KITS) ×1 IMPLANT
PADDING CAST COTTON 6X4 STRL (CAST SUPPLIES) ×2 IMPLANT
PATELLA MEDIAL ATTUN 35MM KNEE (Knees) IMPLANT
PIN STEINMAN FIXATION KNEE (PIN) IMPLANT
PROTECTOR NERVE ULNAR (MISCELLANEOUS) ×1 IMPLANT
SET HNDPC FAN SPRY TIP SCT (DISPOSABLE) ×1 IMPLANT
SPIKE FLUID TRANSFER (MISCELLANEOUS) ×1 IMPLANT
SUT MNCRL AB 4-0 PS2 18 (SUTURE) ×1 IMPLANT
SUT STRATAFIX 0 PDS 27 VIOLET (SUTURE) ×1
SUT VIC AB 2-0 CT1 27 (SUTURE) ×3
SUT VIC AB 2-0 CT1 TAPERPNT 27 (SUTURE) ×3 IMPLANT
SUTURE STRATFX 0 PDS 27 VIOLET (SUTURE) ×1 IMPLANT
TRAY FOLEY MTR SLVR 16FR STAT (SET/KITS/TRAYS/PACK) ×1 IMPLANT
TUBE SUCTION HIGH CAP CLEAR NV (SUCTIONS) ×1 IMPLANT
WATER STERILE IRR 1000ML POUR (IV SOLUTION) ×2 IMPLANT
WRAP KNEE MAXI GEL POST OP (GAUZE/BANDAGES/DRESSINGS) ×1 IMPLANT

## 2023-03-09 NOTE — Discharge Instructions (Signed)
 Frank Aluisio, MD Total Joint Specialist [facility] [address] [place], [place] [address] [phone]  TOTAL KNEE REPLACEMENT POSTOPERATIVE DIRECTIONS    Knee Rehabilitation, Guidelines Following Surgery  Results after knee surgery are often greatly improved when you follow the exercise, range of motion and muscle strengthening exercises prescribed by your doctor. Safety measures are also important to protect the knee from further injury. If any of these exercises cause you to have increased pain or swelling in your knee joint, decrease the amount until you are comfortable again and slowly increase them. If you have problems or questions, call your caregiver or physical therapist for advice.   BLOOD CLOT PREVENTION Take an 81 mg Aspirin two times a day for three weeks following surgery. Then resume one 81 mg Aspirin once a day. You may resume your vitamins/supplements upon discharge from the hospital. Do not take any NSAIDs (Advil, Aleve, Ibuprofen, Meloxicam, etc.) until you are 3 weeks out from surgery  HOME CARE INSTRUCTIONS  Remove items at home which could result in a fall. This includes throw rugs or furniture in walking pathways.  ICE to the affected knee as much as tolerated. Icing helps control swelling. If the swelling is well controlled you will be more comfortable and rehab easier. Continue to use ice on the knee for pain and swelling from surgery. You may notice swelling that will progress down to the foot and ankle. This is normal after surgery. Elevate the leg when you are not up walking on it.    Continue to use the breathing machine which will help keep your temperature down. It is common for your temperature to cycle up and down following surgery, especially at night when you are not up moving around and exerting yourself. The breathing machine keeps your lungs expanded and your temperature down. Do not place pillow under the operative  knee, focus on keeping the knee straight while resting  DIET You may resume your previous home diet once you are discharged from the hospital.  DRESSING / WOUND CARE / SHOWERING Keep your bulky bandage on for 2 days. On the third post-operative day you may remove the Ace bandage and gauze. There is a waterproof adhesive bandage on your skin which will stay in place until your first follow-up appointment. Once you remove this you will not need to place another bandage You may begin showering 3 days following surgery, but do not submerge the incision under water.  ACTIVITY For the first 5 days, the key is rest and control of pain and swelling Do your home exercises twice a day starting on post-operative day 3. On the days you go to physical therapy, just do the home exercises once that day. You should rest, ice and elevate the leg for 50 minutes out of every hour. Get up and walk/stretch for 10 minutes per hour. After 5 days you can increase your activity slowly as tolerated. Walk with your walker as instructed. Use the walker until you are comfortable transitioning to a cane. Walk with the cane in the opposite hand of the operative leg. You may discontinue the cane once you are comfortable and walking steadily. Avoid periods of inactivity such as sitting longer than an hour when not asleep. This helps prevent blood clots.  You may discontinue the knee immobilizer once you are able to perform a straight leg raise while lying down. You may resume a sexual relationship in one month or when given the OK by   your doctor.  You may return to work once you are cleared by your doctor.  Do not drive a car for 6 weeks or until released by your surgeon.  Do not drive while taking narcotics.  TED HOSE STOCKINGS Wear the elastic stockings on both legs for three weeks following surgery during the day. You may remove them at night for sleeping.  WEIGHT BEARING Weight bearing as tolerated with assist device  (walker, cane, etc) as directed, use it as long as suggested by your surgeon or therapist, typically at least 4-6 weeks.  POSTOPERATIVE CONSTIPATION PROTOCOL Constipation - defined medically as fewer than three stools per week and severe constipation as less than one stool per week.  One of the most common issues patients have following surgery is constipation.  Even if you have a regular bowel pattern at home, your normal regimen is likely to be disrupted due to multiple reasons following surgery.  Combination of anesthesia, postoperative narcotics, change in appetite and fluid intake all can affect your bowels.  In order to avoid complications following surgery, here are some recommendations in order to help you during your recovery period.  Colace (docusate) - Pick up an over-the-counter form of Colace or another stool softener and take twice a day as long as you are requiring postoperative pain medications.  Take with a full glass of water daily.  If you experience loose stools or diarrhea, hold the colace until you stool forms back up. If your symptoms do not get better within 1 week or if they get worse, check with your doctor. Dulcolax (bisacodyl) - Pick up over-the-counter and take as directed by the product packaging as needed to assist with the movement of your bowels.  Take with a full glass of water.  Use this product as needed if not relieved by Colace only.  MiraLax (polyethylene glycol) - Pick up over-the-counter to have on hand. MiraLax is a solution that will increase the amount of water in your bowels to assist with bowel movements.  Take as directed and can mix with a glass of water, juice, soda, coffee, or tea. Take if you go more than two days without a movement. Do not use MiraLax more than once per day. Call your doctor if you are still constipated or irregular after using this medication for 7 days in a row.  If you continue to have problems with postoperative constipation, please  contact the office for further assistance and recommendations.  If you experience "the worst abdominal pain ever" or develop nausea or vomiting, please contact the office immediatly for further recommendations for treatment.  ITCHING If you experience itching with your medications, try taking only a single pain pill, or even half a pain pill at a time.  You can also use Benadryl over the counter for itching or also to help with sleep.   MEDICATIONS See your medication summary on the "After Visit Summary" that the nursing staff will review with you prior to discharge.  You may have some home medications which will be placed on hold until you complete the course of blood thinner medication.  It is important for you to complete the blood thinner medication as prescribed by your surgeon.  Continue your approved medications as instructed at time of discharge.  PRECAUTIONS If you experience chest pain or shortness of breath - call 911 immediately for transfer to the hospital emergency department.  If you develop a fever greater that 101 F, purulent drainage from wound, increased redness   or drainage from wound, foul odor from the wound/dressing, or calf pain - CONTACT YOUR SURGEON.                                                   FOLLOW-UP APPOINTMENTS Make sure you keep all of your appointments after your operation with your surgeon and caregivers. You should call the office at the above phone number and make an appointment for approximately two weeks after the date of your surgery or on the date instructed by your surgeon outlined in the "After Visit Summary".  RANGE OF MOTION AND STRENGTHENING EXERCISES  Rehabilitation of the knee is important following a knee injury or an operation. After just a few days of immobilization, the muscles of the thigh which control the knee become weakened and shrink (atrophy). Knee exercises are designed to build up the tone and strength of the thigh muscles and to  improve knee motion. Often times heat used for twenty to thirty minutes before working out will loosen up your tissues and help with improving the range of motion but do not use heat for the first two weeks following surgery. These exercises can be done on a training (exercise) mat, on the floor, on a table or on a bed. Use what ever works the best and is most comfortable for you Knee exercises include:  Leg Lifts - While your knee is still immobilized in a splint or cast, you can do straight leg raises. Lift the leg to 60 degrees, hold for 3 sec, and slowly lower the leg. Repeat 10-20 times 2-3 times daily. Perform this exercise against resistance later as your knee gets better.  Quad and Hamstring Sets - Tighten up the muscle on the front of the thigh (Quad) and hold for 5-10 sec. Repeat this 10-20 times hourly. Hamstring sets are done by pushing the foot backward against an object and holding for 5-10 sec. Repeat as with quad sets.  Leg Slides: Lying on your back, slowly slide your foot toward your buttocks, bending your knee up off the floor (only go as far as is comfortable). Then slowly slide your foot back down until your leg is flat on the floor again. Angel Wings: Lying on your back spread your legs to the side as far apart as you can without causing discomfort.  A rehabilitation program following serious knee injuries can speed recovery and prevent re-injury in the future due to weakened muscles. Contact your doctor or a physical therapist for more information on knee rehabilitation.   POST-OPERATIVE OPIOID TAPER INSTRUCTIONS: It is important to wean off of your opioid medication as soon as possible. If you do not need pain medication after your surgery it is ok to stop day one. Opioids include: Codeine, Hydrocodone(Norco, Vicodin), Oxycodone(Percocet, oxycontin) and hydromorphone amongst others.  Long term and even short term use of opiods can cause: Increased pain  response Dependence Constipation Depression Respiratory depression And more.  Withdrawal symptoms can include Flu like symptoms Nausea, vomiting And more Techniques to manage these symptoms Hydrate well Eat regular healthy meals Stay active Use relaxation techniques(deep breathing, meditating, yoga) Do Not substitute Alcohol to help with tapering If you have been on opioids for less than two weeks and do not have pain than it is ok to stop all together.  Plan to wean off of opioids This   plan should start within one week post op of your joint replacement. Maintain the same interval or time between taking each dose and first decrease the dose.  Cut the total daily intake of opioids by one tablet each day Next start to increase the time between doses. The last dose that should be eliminated is the evening dose.   IF YOU ARE TRANSFERRED TO A SKILLED REHAB FACILITY If the patient is transferred to a skilled rehab facility following release from the hospital, a list of the current medications will be sent to the facility for the patient to continue.  When discharged from the skilled rehab facility, please have the facility set up the patient's Home Health Physical Therapy prior to being released. Also, the skilled facility will be responsible for providing the patient with their medications at time of release from the facility to include their pain medication, the muscle relaxants, and their blood thinner medication. If the patient is still at the rehab facility at time of the two week follow up appointment, the skilled rehab facility will also need to assist the patient in arranging follow up appointment in our office and any transportation needs.  MAKE SURE YOU:  Understand these instructions.  Get help right away if you are not doing well or get worse.   DENTAL ANTIBIOTICS:  In most cases prophylactic antibiotics for Dental procdeures after total joint surgery are not  necessary.  Exceptions are as follows:  1. History of prior total joint infection  2. Severely immunocompromised (Organ Transplant, cancer chemotherapy, Rheumatoid biologic meds such as Humera)  3. Poorly controlled diabetes (A1C &gt; 8.0, blood glucose over 200)  If you have one of these conditions, contact your surgeon for an antibiotic prescription, prior to your dental procedure.    Pick up stool softner and laxative for home use following surgery while on pain medications. Do not submerge incision under water. Please use good hand washing techniques while changing dressing each day. May shower starting three days after surgery. Please use a clean towel to pat the incision dry following showers. Continue to use ice for pain and swelling after surgery. Do not use any lotions or creams on the incision until instructed by your surgeon.  

## 2023-03-09 NOTE — Transfer of Care (Signed)
Immediate Anesthesia Transfer of Care Note  Patient: Lisa Everett  Procedure(s) Performed: TOTAL KNEE ARTHROPLASTY (Left: Knee)  Patient Location: PACU  Anesthesia Type:Spinal  Level of Consciousness: awake, alert , oriented, and patient cooperative  Airway & Oxygen Therapy: Patient Spontanous Breathing and Patient connected to face mask oxygen  Post-op Assessment: Report given to RN and Post -op Vital signs reviewed and stable  Post vital signs: Reviewed and stable  Last Vitals:  Vitals Value Taken Time  BP 93/70 03/09/23 1002  Temp    Pulse 79 03/09/23 1003  Resp 17 03/09/23 1003  SpO2 98 % 03/09/23 1003  Vitals shown include unvalidated device data.  Last Pain:  Vitals:   03/09/23 0748  TempSrc:   PainSc: 0-No pain         Complications: No notable events documented.

## 2023-03-09 NOTE — Plan of Care (Signed)
  Problem: Activity: Goal: Ability to avoid complications of mobility impairment will improve 03/09/2023 1236 by Penelope Galas, RN Outcome: Progressing 03/09/2023 1236 by Penelope Galas, RN Outcome: Progressing Goal: Range of joint motion will improve 03/09/2023 1236 by Penelope Galas, RN Outcome: Progressing 03/09/2023 1236 by Penelope Galas, RN Outcome: Progressing   Problem: Safety: Goal: Ability to remain free from injury will improve Outcome: Progressing   Problem: Pain Managment: Goal: General experience of comfort will improve Outcome: Progressing

## 2023-03-09 NOTE — Progress Notes (Signed)
Orthopedic Tech Progress Note Patient Details:  Lisa Everett 1966/04/07 [id number]   CPM was applied and activated to patient.   Post Interventions Patient Tolerated: Well Instructions Provided: Adjustment of device, Care of device  Sherilyn Banker 03/09/2023, 11:33 AM

## 2023-03-09 NOTE — Evaluation (Signed)
Physical Therapy Evaluation Patient Details Name: Lisa Everett MRN: [id number] DOB: [birth year] Today's Date: 03/09/2023  History of Present Illness  57 yo female, S/P LTKA 03/09/23. PMH:HTN  Clinical Impression  Pt admitted with above diagnosis.  Pt currently with functional limitations due to the deficits listed below (see PT Problem List). Pt will benefit from acute skilled PT to increase their independence and safety with mobility to allow discharge.     The patient reports pain  6/10 after standing and taking steps to recliner. RN in  to assess. Patient hd  been premedicated.  Patient should progress to DC home with support.     Recommendations for follow up therapy are one component of a multi-disciplinary discharge planning process, led by the attending physician.  Recommendations may be updated based on patient status, additional functional criteria and insurance authorization.  Follow Up Recommendations       Assistance Recommended at Discharge Intermittent Supervision/Assistance  Patient can return home with the following  A little help with walking and/or transfers;Help with stairs or ramp for entrance;A little help with bathing/dressing/bathroom;Assistance with cooking/housework;Assist for transportation    Equipment Recommendations Rolling walker (2 wheels) (youth)  Recommendations for Other Services       Functional Status Assessment Patient has had a recent decline in their functional status and demonstrates the ability to make significant improvements in function in a reasonable and predictable amount of time.     Precautions / Restrictions Precautions Precautions: Fall;Knee Required Braces or Orthoses: Knee Immobilizer - Left Knee Immobilizer - Left: Discontinue once straight leg raise with < 10 degree lag Restrictions Weight Bearing Restrictions: No      Mobility  Bed Mobility Overal bed mobility: Needs Assistance Bed Mobility: Supine to Sit      Supine to sit: Min assist     General bed mobility comments: use of belt  to assist LLE,    Transfers Overall transfer level: Needs assistance Equipment used: Rolling walker (2 wheels) Transfers: Sit to/from Stand Sit to Stand: Min assist           General transfer comment: cues for hand and LLE position, leans forward on RW    Ambulation/Gait Ambulation/Gait assistance: Min assist Gait Distance (Feet): 5 Feet Assistive device: Rolling walker (2 wheels) Gait Pattern/deviations: Step-to pattern, Antalgic, Decreased stance time - left       General Gait Details: difficulty placing WB on LLE, Step to recliner  Stairs            Wheelchair Mobility    Modified Rankin (Stroke Patients Only)       Balance Overall balance assessment: Needs assistance Sitting-balance support: No upper extremity supported, Feet supported Sitting balance-Leahy Scale: Good     Standing balance support: Bilateral upper extremity supported, Reliant on assistive device for balance, During functional activity Standing balance-Leahy Scale: Fair                               Pertinent Vitals/Pain Pain Assessment Pain Assessment: 0-10 Pain Score: 6  Pain Location: left knee Pain Descriptors / Indicators: Aching, Discomfort, Grimacing, Guarding, Crying Pain Intervention(s): Monitored during session, Premedicated before session, Patient requesting pain meds-RN notified, Ice applied    Home Living Family/patient expects to be discharged to:: Private residence Living Arrangements: Non-relatives/Friends Available Help at Discharge: Friend(s);Available 24 hours/day Type of Home: House Home Access: Level entry     Alternate Level Stairs-Number of Steps: flight Home  Layout: Two level;1/2 bath on main level Home Equipment: None      Prior Function Prior Level of Function : Independent/Modified Independent;Driving;Working/employed                     Hand  Dominance   Dominant Hand: Right    Extremity/Trunk Assessment   Upper Extremity Assessment Upper Extremity Assessment: Overall WFL for tasks assessed    Lower Extremity Assessment Lower Extremity Assessment: LLE deficits/detail LLE Deficits / Details: SLR wityh assistnace, knee f;ex tolerated ~45    Cervical / Trunk Assessment Cervical / Trunk Assessment: Normal  Communication   Communication: No difficulties  Cognition Arousal/Alertness: Awake/alert Behavior During Therapy: WFL for tasks assessed/performed Overall Cognitive Status: Within Functional Limits for tasks assessed                                          General Comments      Exercises     Assessment/Plan    PT Assessment Patient needs continued PT services  PT Problem List Decreased strength;Decreased balance;Decreased knowledge of precautions;Decreased range of motion;Decreased mobility;Decreased activity tolerance;Pain       PT Treatment Interventions DME instruction;Functional mobility training;Gait training;Therapeutic activities;Stair training;Therapeutic exercise;Patient/family education    PT Goals (Current goals can be found in the Care Plan section)  Acute Rehab PT Goals Patient Stated Goal: to walk, get other knee surgery PT Goal Formulation: With patient Time For Goal Achievement: 03/16/23 Potential to Achieve Goals: Good    Frequency 7X/week     Co-evaluation               AM-PAC PT "6 Clicks" Mobility  Outcome Measure Help needed turning from your back to your side while in a flat bed without using bedrails?: A Little Help needed moving from lying on your back to sitting on the side of a flat bed without using bedrails?: A Little Help needed moving to and from a bed to a chair (including a wheelchair)?: A Little Help needed standing up from a chair using your arms (e.g., wheelchair or bedside chair)?: A Little Help needed to walk in hospital room?: Total Help  needed climbing 3-5 steps with a railing? : Total 6 Click Score: 14    End of Session Equipment Utilized During Treatment: Gait belt Activity Tolerance: Patient limited by pain Patient left: in chair;with call bell/phone within reach;with nursing/sitter in room;with chair alarm set Nurse Communication: Mobility status PT Visit Diagnosis: Unsteadiness on feet (R26.81);Muscle weakness (generalized) (M62.81);Difficulty in walking, not elsewhere classified (R26.2);Pain Pain - Right/Left: Left Pain - part of body: Knee    Time: 1530-1553 PT Time Calculation (min) (ACUTE ONLY): 23 min   Charges:   PT Evaluation $PT Eval Low Complexity: 1 Low PT Treatments $Gait Training: 8-22 mins        Blanchard Kelch PT Acute Rehabilitation Services Office [phone] Weekend pager-[phone]   Rada Hay 03/09/2023, 4:25 PM

## 2023-03-09 NOTE — Anesthesia Preprocedure Evaluation (Addendum)
Anesthesia Evaluation  Patient identified by MRN, date of birth, ID band Patient awake    Reviewed: Allergy & Precautions, NPO status , Patient's Chart, lab work & pertinent test results  Airway Mallampati: II  TM Distance: >3 FB Neck ROM: Full  Mouth opening: Limited Mouth Opening  Dental  (+) Teeth Intact   Pulmonary asthma    breath sounds clear to auscultation       Cardiovascular hypertension, Pt. on medications  Rhythm:Regular Rate:Normal     Neuro/Psych negative neurological ROS  negative psych ROS   GI/Hepatic negative GI ROS, Neg liver ROS,,,  Endo/Other  negative endocrine ROS    Renal/GU negative Renal ROS     Musculoskeletal  (+) Arthritis ,    Abdominal   Peds  Hematology negative hematology ROS (+)   Anesthesia Other Findings   Reproductive/Obstetrics                             Anesthesia Physical Anesthesia Plan  ASA: 2  Anesthesia Plan: Spinal   Post-op Pain Management: Regional block*   Induction: Intravenous  PONV Risk Score and Plan: 3 and Ondansetron, Propofol infusion and Midazolam  Airway Management Planned: Natural Airway and Simple Face Mask  Additional Equipment: None  Intra-op Plan:   Post-operative Plan:   Informed Consent: I have reviewed the patients History and Physical, chart, labs and discussed the procedure including the risks, benefits and alternatives for the proposed anesthesia with the patient or authorized representative who has indicated his/her understanding and acceptance.       Plan Discussed with: CRNA  Anesthesia Plan Comments: (Lab Results      Component                Value               Date                      WBC                      4.1                 02/24/2023                HGB                      13.6                02/24/2023                HCT                      41.4                02/24/2023                 MCV                      91.0                02/24/2023                PLT                      241  02/24/2023           )       Anesthesia Quick Evaluation

## 2023-03-09 NOTE — Anesthesia Procedure Notes (Signed)
Spinal  Start time: 03/09/2023 8:31 AM End time: 03/09/2023 8:34 AM Reason for block: surgical anesthesia Staffing Performed: anesthesiologist  Anesthesiologist: [name], MD Performed by: [name], MD Authorized by: [name], MD   Preanesthetic Checklist Completed: patient identified, IV checked, site marked, risks and benefits discussed, surgical consent, monitors and equipment checked, pre-op evaluation and timeout performed Spinal Block Patient position: sitting Prep: DuraPrep and site prepped and draped Location: L3-4 Injection technique: single-shot Needle Needle type: Pencan  Needle gauge: 24 G Needle length: 10 cm Needle insertion depth: 10 cm Additional Notes Patient tolerated well. No immediate complications.  Functioning IV was confirmed and monitors were applied. Sterile prep and drape, including hand hygiene and sterile gloves were used. The patient was positioned and the back was prepped. The skin was anesthetized with lidocaine. Free flow of clear CSF was obtained prior to injecting local anesthetic into the CSF. The spinal needle aspirated freely following injection. The needle was carefully withdrawn. The patient tolerated the procedure well.

## 2023-03-09 NOTE — Anesthesia Procedure Notes (Signed)
Anesthesia Regional Block: Adductor canal block   Pre-Anesthetic Checklist: , timeout performed,  Correct Patient, Correct Site, Correct Laterality,  Correct Procedure, Correct Position, site marked,  Risks and benefits discussed,  Surgical consent,  Pre-op evaluation,  At surgeon's request and post-op pain management  Laterality: Left  Prep: chloraprep       Needles:  Injection technique: Single-shot  Needle Type: Echogenic Stimulator Needle     Needle Length: 9cm  Needle Gauge: 21     Additional Needles:   Procedures:,,,, ultrasound used (permanent image in chart),,    Narrative:  Start time: 03/09/2023 7:40 AM End time: 03/09/2023 7:45 AM Injection made incrementally with aspirations every 5 mL.  Performed by: Personally  Anesthesiologist: [name], MD  Additional Notes: Discussed risks and benefits of the nerve block in detail, including but not limited vascular injury, permanent nerve damage and infection.   Patient tolerated the procedure well. Local anesthetic introduced in an incremental fashion under minimal resistance after negative aspirations. No paresthesias were elicited. After completion of the procedure, no acute issues were identified and patient continued to be monitored by RN.

## 2023-03-09 NOTE — Anesthesia Procedure Notes (Signed)
Spinal  Staffing Performed by: Shanon Payor, CRNA Authorized by: [name], MD

## 2023-03-09 NOTE — Op Note (Signed)
OPERATIVE REPORT-TOTAL KNEE ARTHROPLASTY   Pre-operative diagnosis- Osteoarthritis  Left knee(s)  Post-operative diagnosis- Osteoarthritis Left knee(s)  Procedure-  Left  Total Knee Arthroplasty  Surgeon- Gus Rankin. Cezar Misiaszek, MD  Assistant- Arther Abbott, PA-C   Anesthesia-   Adductor canal block and spinal  EBL-50 mL   Drains None  Tourniquet time-  Total Tourniquet Time Documented: Thigh (Left) - 34 minutes Total: Thigh (Left) - 34 minutes     Complications- None  Condition-PACU - hemodynamically stable.   Brief Clinical Note  Lisa Everett is a 57 y.o. year old female with end stage OA of her left knee with progressively worsening pain and dysfunction. She has constant pain, with activity and at rest and significant functional deficits with difficulties even with ADLs. She has had extensive non-op management including analgesics, injections of cortisone and viscosupplements, and home exercise program, but remains in significant pain with significant dysfunction. Radiographs show bone on bone arthritis medial and patellofemoral. She presents now for left Total Knee Arthroplasty.     Procedure in detail---   The patient is brought into the operating room and positioned supine on the operating table. After successful administration of  Adductor canal block and spinal,   a tourniquet is placed high on the  Left thigh(s) and the lower extremity is prepped and draped in the usual sterile fashion. Time out is performed by the operating team and then the  Left lower extremity is wrapped in Esmarch, knee flexed and the tourniquet inflated to 300 mmHg.       A midline incision is made with a ten blade through the subcutaneous tissue to the level of the extensor mechanism. A fresh blade is used to make a medial parapatellar arthrotomy. Soft tissue over the proximal medial tibia is subperiosteally elevated to the joint line with a knife and into the semimembranosus bursa with a  Cobb elevator. Soft tissue over the proximal lateral tibia is elevated with attention being paid to avoiding the patellar tendon on the tibial tubercle. The patella is everted, knee flexed 90 degrees and the ACL and PCL are removed. Findings are bone on bone medial and patellofemoral with large global osteophytes        The drill is used to create a starting hole in the distal femur and the canal is thoroughly irrigated with sterile saline to remove the fatty contents. The 5 degree Left  valgus alignment guide is placed into the femoral canal and the distal femoral cutting block is pinned to remove 9 mm off the distal femur. Resection is made with an oscillating saw.      The tibia is subluxed forward and the menisci are removed. The extramedullary alignment guide is placed referencing proximally at the medial aspect of the tibial tubercle and distally along the second metatarsal axis and tibial crest. The block is pinned to remove 2mm off the more deficient medial  side. Resection is made with an oscillating saw. Size 4is the most appropriate size for the tibia and the proximal tibia is prepared with the modular drill and keel punch for that size.      The femoral sizing guide is placed and size 5 is most appropriate. Rotation is marked off the epicondylar axis and confirmed by creating a rectangular flexion gap at 90 degrees. The size 5 cutting block is pinned in this rotation and the anterior, posterior and chamfer cuts are made with the oscillating saw. The intercondylar block is then placed and that cut is  made.      Trial size 4 tibial component, trial size 5 posterior stabilized femur and a 8  mm posterior stabilized rotating platform insert trial is placed. Full extension is achieved with excellent varus/valgus and anterior/posterior balance throughout full range of motion. The patella is everted and thickness measured to be 22  mm. Free hand resection is taken to 12 mm, a 35 template is placed, lug  holes are drilled, trial patella is placed, and it tracks normally. Osteophytes are removed off the posterior femur with the trial in place. All trials are removed and the cut bone surfaces prepared with pulsatile lavage. Cement is mixed and once ready for implantation, the size 4 tibial implant, size  5 posterior stabilized femoral component, and the size 35 patella are cemented in place and the patella is held with the clamp. The trial insert is placed and the knee held in full extension. The Exparel (20 ml mixed with 60 ml saline) is injected into the extensor mechanism, posterior capsule, medial and lateral gutters and subcutaneous tissues.  All extruded cement is removed and once the cement is hard the permanent 8 mm posterior stabilized rotating platform insert is placed into the tibial tray.      The wound is copiously irrigated with saline solution and the extensor mechanism closed with # 0 Stratofix suture. The tourniquet is released for a total tourniquet time of 34  minutes. Flexion against gravity is 140 degrees and the patella tracks normally. Subcutaneous tissue is closed with 2.0 vicryl and subcuticular with running 4.0 Monocryl. The incision is cleaned and dried and steri-strips and a bulky sterile dressing are applied. The limb is placed into a knee immobilizer and the patient is awakened and transported to recovery in stable condition.      Please note that a surgical assistant was a medical necessity for this procedure in order to perform it in a safe and expeditious manner. Surgical assistant was necessary to retract the ligaments and vital neurovascular structures to prevent injury to them and also necessary for proper positioning of the limb to allow for anatomic placement of the prosthesis.   Gus Rankin Dekota Kirlin, MD    03/09/2023, 9:38 AM

## 2023-03-09 NOTE — Anesthesia Postprocedure Evaluation (Signed)
Anesthesia Post Note  Patient: Lisa Everett  Procedure(s) Performed: TOTAL KNEE ARTHROPLASTY (Left: Knee)     Patient location during evaluation: PACU Anesthesia Type: Spinal Level of consciousness: oriented and awake and alert Pain management: pain level controlled Vital Signs Assessment: post-procedure vital signs reviewed and stable Respiratory status: spontaneous breathing, respiratory function stable and patient connected to nasal cannula oxygen Cardiovascular status: blood pressure returned to baseline and stable Postop Assessment: no headache, no backache and no apparent nausea or vomiting Anesthetic complications: no  No notable events documented.  Last Vitals:  Vitals:   03/09/23 1220 03/09/23 1417  BP: 124/79 113/77  Pulse: 71 83  Resp: 16 18  Temp: 36.5 C 36.6 C  SpO2: 100% 99%    Last Pain:  Vitals:   03/09/23 1454  TempSrc:   PainSc: 5                  [name]

## 2023-03-09 NOTE — Interval H&P Note (Signed)
History and Physical Interval Note:  03/09/2023 6:30 AM  Lisa Everett  has presented today for surgery, with the diagnosis of left knee osteoarthritis.  The various methods of treatment have been discussed with the patient and family. After consideration of risks, benefits and other options for treatment, the patient has consented to  Procedure(s): TOTAL KNEE ARTHROPLASTY (Left) as a surgical intervention.  The patient's history has been reviewed, patient examined, no change in status, stable for surgery.  I have reviewed the patient's chart and labs.  Questions were answered to the patient's satisfaction.     Homero Fellers Maxey Ransom

## 2023-03-10 ENCOUNTER — Encounter (HOSPITAL_COMMUNITY): Payer: Self-pay | Admitting: Orthopedic Surgery

## 2023-03-10 DIAGNOSIS — M1712 Unilateral primary osteoarthritis, left knee: Secondary | ICD-10-CM | POA: Diagnosis not present

## 2023-03-10 LAB — BASIC METABOLIC PANEL
Anion gap: 10 (ref 5–15)
BUN: 17 mg/dL (ref 6–20)
CO2: 26 mmol/L (ref 22–32)
Calcium: 8.2 mg/dL — ABNORMAL LOW (ref 8.9–10.3)
Chloride: 102 mmol/L (ref 98–111)
Creatinine, Ser: 1.09 mg/dL — ABNORMAL HIGH (ref 0.44–1.00)
GFR, Estimated: 59 mL/min — ABNORMAL LOW (ref >60)
Glucose, Bld: 165 mg/dL — ABNORMAL HIGH (ref 70–99)
Potassium: 3.4 mmol/L — ABNORMAL LOW (ref 3.5–5.1)
Sodium: 138 mmol/L (ref 135–145)

## 2023-03-10 LAB — CBC
HCT: 34.6 % — ABNORMAL LOW (ref 36.0–46.0)
Hemoglobin: 11.6 g/dL — ABNORMAL LOW (ref 12.0–15.0)
MCH: 30.1 pg (ref 26.0–34.0)
MCHC: 33.5 g/dL (ref 30.0–36.0)
MCV: 89.6 fL (ref 80.0–100.0)
Platelets: 209 10*3/uL (ref 150–400)
RBC: 3.86 MIL/uL — ABNORMAL LOW (ref 3.87–5.11)
RDW: 12.4 % (ref 11.5–15.5)
WBC: 8.8 10*3/uL (ref 4.0–10.5)
nRBC: 0 % (ref 0.0–0.2)

## 2023-03-10 MED ORDER — OXYCODONE HCL 5 MG PO TABS: 5.0000 mg | ORAL_TABLET | Freq: Four times a day (QID) | ORAL | 0 refills | Status: DC | PRN

## 2023-03-10 MED ORDER — TRAMADOL HCL 50 MG PO TABS: 50.0000 mg | ORAL_TABLET | Freq: Four times a day (QID) | ORAL | 0 refills | Status: AC | PRN

## 2023-03-10 MED ORDER — ASPIRIN 81 MG PO CHEW: CHEWABLE_TABLET | ORAL | 0 refills | Status: AC

## 2023-03-10 MED ORDER — HYDROMORPHONE HCL 2 MG PO TABS
2.0000 mg | ORAL_TABLET | ORAL | Status: DC | PRN
  Administered 2023-03-10 – 2023-03-11 (×4): 4 mg via ORAL
  Administered 2023-03-11: 2 mg via ORAL
  Filled 2023-03-10: qty 2
  Filled 2023-03-10: qty 1
  Filled 2023-03-10 (×3): qty 2

## 2023-03-10 MED ORDER — GABAPENTIN 300 MG PO CAPS: ORAL_CAPSULE | ORAL | 0 refills | Status: AC

## 2023-03-10 NOTE — Progress Notes (Signed)
   Subjective: 1 Day Post-Op Procedure(s) (LRB): TOTAL KNEE ARTHROPLASTY (Left) Patient reports pain as mild.   Patient seen in rounds by Dr. Lequita Halt. Patient is well, and has had no acute complaints or problems No issues overnight. Denies chest pain, SOB, or calf pain. Foley catheter removed this AM.  We will continue therapy today, ambulated 5' yesterday.   Objective: Vital signs in last 24 hours: Temp:  [97.5 F (36.4 C)-98.6 F (37 C)] 98.6 F (37 C) (05/14 0546) Pulse Rate:  [62-87] 81 (05/14 0546) Resp:  [11-21] 17 (05/14 0546) BP: (93-124)/(69-90) 103/69 (05/14 0546) SpO2:  [92 %-100 %] 95 % (05/14 0546) Weight:  [89.4 kg] 89.4 kg (05/13 1248)  Intake/Output from previous day:  Intake/Output Summary (Last 24 hours) at 03/10/2023 0820 Last data filed at 03/10/2023 5621 Gross per 24 hour  Intake 3159.31 ml  Output 1325 ml  Net 1834.31 ml     Intake/Output this shift: No intake/output data recorded.  Labs: Recent Labs    03/10/23 0358  HGB 11.6*   Recent Labs    03/10/23 0358  WBC 8.8  RBC 3.86*  HCT 34.6*  PLT 209   Recent Labs    03/10/23 0358  NA 138  K 3.4*  CL 102  CO2 26  BUN 17  CREATININE 1.09*  GLUCOSE 165*  CALCIUM 8.2*   No results for input(s): "LABPT", "INR" in the last 72 hours.  Exam: General - Patient is Alert and Oriented Extremity - Neurologically intact Neurovascular intact Sensation intact distally Dorsiflexion/Plantar flexion intact Dressing - dressing C/D/I Motor Function - intact, moving foot and toes well on exam.   Past Medical History:  Diagnosis Date   Allergy    pet dander   Arthritis    Asthma    Family history of adverse reaction to anesthesia    Sister has naseau and vomiting   Hot flashes    Hypertension    Hypokalemia    Vitamin D deficiency     Assessment/Plan: 1 Day Post-Op Procedure(s) (LRB): TOTAL KNEE ARTHROPLASTY (Left) Principal Problem:   OA (osteoarthritis) of knee Active Problems:    Primary osteoarthritis of left knee  Estimated body mass index is 36.05 kg/m as calculated from the following:   Height as of this encounter: 5\' 2"  (1.575 m).   Weight as of this encounter: 89.4 kg. Advance diet Up with therapy D/C IV fluids   Patient's anticipated LOS is less than 2 midnights, meeting these requirements: - Younger than 85 - Lives within 1 hour of care - Has a competent adult at home to recover with post-op recover - NO history of  - Chronic pain requiring opioids  - Diabetes  - Coronary Artery Disease  - Heart failure  - Heart attack  - Stroke  - DVT/VTE  - Cardiac arrhythmia  - Respiratory Failure/COPD  - Renal failure  - Anemia  - Advanced Liver disease  DVT Prophylaxis - Aspirin Weight bearing as tolerated. Continue therapy.  Plan is to go Home after hospital stay. Plan for discharge later today if progresses with therapy and meeting goals. Scheduled [facility] at [facility]). Follow-up in the office in 2 weeks.  The PDMP database was reviewed today prior to any opioid medications being prescribed to this patient.  Arther Abbott, PA-C Orthopedic Surgery [phone] 03/10/2023, 8:20 AM

## 2023-03-10 NOTE — Progress Notes (Signed)
Physical Therapy Treatment Patient Details Name: Lisa Everett MRN: [id number] DOB: 1966/10/09 Today's Date: 03/10/2023   History of Present Illness 57 yo female, S/P LTKA 03/09/23. PMH:HTN    PT Comments    The  patient is making progress in mobility and ambulation. Patient   mildly diaphoretic after ambulating to BR, once  resting and cooled off, felt better. Patient  reports that she will be ome alone most of day so will need to be safe at mod I level. Currently has not met that milestone. Continue PT .   Recommendations for follow up therapy are one component of a multi-disciplinary discharge planning process, led by the attending physician.  Recommendations may be updated based on patient status, additional functional criteria and insurance authorization.  Follow Up Recommendations       Assistance Recommended at Discharge Intermittent Supervision/Assistance  Patient can return home with the following A little help with walking and/or transfers;Help with stairs or ramp for entrance;A little help with bathing/dressing/bathroom;Assistance with cooking/housework;Assist for transportation   Equipment Recommendations  None recommended by PT    Recommendations for Other Services       Precautions / Restrictions Precautions Precautions: Fall;Knee Required Braces or Orthoses: Knee Immobilizer - Left Knee Immobilizer - Left: Discontinue once straight leg raise with < 10 degree lag Restrictions Weight Bearing Restrictions: No     Mobility  Bed Mobility Overal bed mobility: Needs Assistance Bed Mobility: Supine to Sit     Supine to sit: HOB elevated, Supervision     General bed mobility comments: use of belt on  LLE, extra tim, no assistance required    Transfers Overall transfer level: Needs assistance Equipment used: Rolling walker (2 wheels) Transfers: Sit to/from Stand Sit to Stand: Min guard           General transfer comment: patient leans forward to  push self up from bed and from toilet    Ambulation/Gait Ambulation/Gait assistance: Min guard Gait Distance (Feet): 20 Feet (x 2) Assistive device: Rolling walker (2 wheels) Gait Pattern/deviations: Step-to pattern, Antalgic, Decreased stance time - left       General Gait Details: patient moves slowly,  tends to lean forward   Stairs             Wheelchair Mobility    Modified Rankin (Stroke Patients Only)       Balance Overall balance assessment: Independent Sitting-balance support: No upper extremity supported, Feet supported Sitting balance-Leahy Scale: Good     Standing balance support: Bilateral upper extremity supported, Reliant on assistive device for balance, During functional activity Standing balance-Leahy Scale: Fair                              Cognition Arousal/Alertness: Awake/alert                                              Exercises Total Joint Exercises Ankle Circles/Pumps: AROM, Both, 10 reps Quad Sets: AROM, Both, 10 reps Heel Slides: AAROM, Left, 5 reps Straight Leg Raises: AAROM, Left, 5 reps    General Comments        Pertinent Vitals/Pain Pain Assessment Pain Score: 4  Pain Location: left knee Pain Descriptors / Indicators: Aching, Discomfort, Grimacing, Guarding, Crying Pain Intervention(s): Monitored during session, Premedicated before session, Ice applied  Home Living                          Prior Function            PT Goals (current goals can now be found in the care plan section) Progress towards PT goals: Progressing toward goals    Frequency    7X/week      PT Plan Current plan remains appropriate    Co-evaluation              AM-PAC PT "6 Clicks" Mobility   Outcome Measure  Help needed turning from your back to your side while in a flat bed without using bedrails?: A Little Help needed moving from lying on your back to sitting on the side of a  flat bed without using bedrails?: A Little Help needed moving to and from a bed to a chair (including a wheelchair)?: A Little Help needed standing up from a chair using your arms (e.g., wheelchair or bedside chair)?: A Little Help needed to walk in hospital room?: A Little Help needed climbing 3-5 steps with a railing? : A Lot 6 Click Score: 17    End of Session Equipment Utilized During Treatment: Gait belt Activity Tolerance: Patient tolerated treatment well Patient left: in chair;with call bell/phone within reach;with chair alarm set Nurse Communication: Mobility status PT Visit Diagnosis: Unsteadiness on feet (R26.81);Muscle weakness (generalized) (M62.81);Difficulty in walking, not elsewhere classified (R26.2);Pain Pain - Right/Left: Left Pain - part of body: Knee     Time: 1610-9604 PT Time Calculation (min) (ACUTE ONLY): 46 min  Charges:  $Gait Training: 8-22 mins $Therapeutic Exercise: 8-22 mins $Self Care/Home Management: 8-22                     Blanchard Kelch PT Acute Rehabilitation Services Office [phone] Weekend [phone]   Rada Hay 03/10/2023, 11:41 AM

## 2023-03-10 NOTE — TOC Transition Note (Addendum)
Transition of Care [facility]) - CM/SW Discharge Note  Patient Details  Name: Lisa Everett MRN: [id number] Date of Birth: 12-May-1966  [facility] CM/SW Contact:  Ewing Schlein, LCSW Phone Number: 03/10/2023, 9:55 AM  Clinical Narrative: Patient is expected to discharge home after working with PT. CSW met with patient to confirm discharge plan and needs. Patient will go home with OPPT at [facility]. Patient will need a youth rolling walker. [employer] delivered walker to patient's room. TOC signing off.  Addendum: Patient declined youth rolling walker, so [employer] will not provide one at this time.    Final next level of care: OP Rehab Barriers to Discharge: No Barriers Identified  Patient Goals and CMS Choice CMS Medicare.gov Compare Post Acute Care list provided to:: Patient Choice offered to / list presented to : Patient  Discharge Plan and Services Additional resources added to the After Visit Summary for            DME Arranged: Walker youth DME Agency: [employer] Date DME Agency Contacted: 03/10/23 Representative spoke with at DME Agency: Yvonna Alanis  Social Determinants of Health (SDOH) Interventions SDOH Screenings   Food Insecurity: No Food Insecurity (03/09/2023)  Housing: Low Risk  (03/09/2023)  Transportation Needs: No Transportation Needs (03/09/2023)  Utilities: Not At Risk (03/09/2023)  Tobacco Use: Low Risk  (03/10/2023)   Readmission Risk Interventions     No data to display

## 2023-03-10 NOTE — Progress Notes (Signed)
Physical Therapy Treatment Patient Details Name: Lisa Everett MRN: [id number] DOB: [birth year] Today's Date: 03/10/2023   History of Present Illness 57 yo female, S/P LTKA 03/09/23. PMH:HTN    PT Comments    Patient is making steady progress with ambulation and mobility. Gait is slow paced. Patient may benefit from a  toilet riser .  Continue PT, will need to practice steps prior to DC   Recommendations for follow up therapy are one component of a multi-disciplinary discharge planning process, led by the attending physician.  Recommendations may be updated based on patient status, additional functional criteria and insurance authorization.  Follow Up Recommendations       Assistance Recommended at Discharge Intermittent Supervision/Assistance  Patient can return home with the following A little help with walking and/or transfers;Help with stairs or ramp for entrance;A little help with bathing/dressing/bathroom;Assistance with cooking/housework;Assist for transportation   Equipment Recommendations  BSC/3in1    Recommendations for Other Services       Precautions / Restrictions Precautions Precautions: Fall;Knee Required Braces or Orthoses: Knee Immobilizer - Left Knee Immobilizer - Left: Discontinue once straight leg raise with < 10 degree lag     Mobility  Bed Mobility Overal bed mobility: Needs Assistance Bed Mobility: Supine to Sit     Supine to sit: HOB elevated, Supervision     General bed mobility comments: in recliner    Transfers Overall transfer level: Needs assistance Equipment used: Rolling walker (2 wheels) Transfers: Sit to/from Stand Sit to Stand: Min guard           General transfer comment: patient leans forward to push self up from  recliner and toilet riser    Ambulation/Gait Ambulation/Gait assistance: Min guard Gait Distance (Feet): 20 Feet (then 30) Assistive device: Rolling walker (2 wheels) Gait Pattern/deviations: Step-to  pattern, Antalgic, Decreased stance time - left       General Gait Details: patient moves slowly,  tends to lean forward   Stairs             Wheelchair Mobility    Modified Rankin (Stroke Patients Only)       Balance Overall balance assessment: Needs assistance Sitting-balance support: No upper extremity supported, Feet supported Sitting balance-Leahy Scale: Good     Standing balance support: Bilateral upper extremity supported, Reliant on assistive device for balance, During functional activity Standing balance-Leahy Scale: Fair                              Cognition Arousal/Alertness: Awake/alert Behavior During Therapy: WFL for tasks assessed/performed Overall Cognitive Status: Within Functional Limits for tasks assessed                                          Exercises Total Joint Exercises Ankle Circles/Pumps: AROM, Both, 10 reps Quad Sets: AROM, Both, 10 reps Heel Slides: AAROM, Left, 5 reps Straight Leg Raises: AAROM, Left, 5 reps    General Comments        Pertinent Vitals/Pain Pain Assessment Pain Score: 5  Pain Location: left knee Pain Descriptors / Indicators: Aching, Discomfort, Grimacing, Guarding, Crying Pain Intervention(s): Monitored during session, Premedicated before session, Ice applied    Home Living                          Prior  Function            PT Goals (current goals can now be found in the care plan section) Progress towards PT goals: Progressing toward goals    Frequency    7X/week      PT Plan Current plan remains appropriate    Co-evaluation              AM-PAC PT "6 Clicks" Mobility   Outcome Measure  Help needed turning from your back to your side while in a flat bed without using bedrails?: A Little Help needed moving from lying on your back to sitting on the side of a flat bed without using bedrails?: A Little Help needed moving to and from a bed to a  chair (including a wheelchair)?: A Little Help needed standing up from a chair using your arms (e.g., wheelchair or bedside chair)?: A Little Help needed to walk in hospital room?: A Little Help needed climbing 3-5 steps with a railing? : A Lot 6 Click Score: 17    End of Session Equipment Utilized During Treatment: Gait belt;Left knee immobilizer Activity Tolerance: Patient tolerated treatment well Patient left: in chair;with call bell/phone within reach;with family/visitor present Nurse Communication: Mobility status PT Visit Diagnosis: Unsteadiness on feet (R26.81);Muscle weakness (generalized) (M62.81);Difficulty in walking, not elsewhere classified (R26.2);Pain Pain - Right/Left: Left Pain - part of body: Knee     Time: 8657-8469 PT Time Calculation (min) (ACUTE ONLY): 20 min  Charges:  $Gait Training: 8-22 mins                     Blanchard Kelch PT Acute Rehabilitation Services Office [phone] Weekend [phone]    Rada Hay 03/10/2023, 4:13 PM

## 2023-03-11 DIAGNOSIS — M1712 Unilateral primary osteoarthritis, left knee: Secondary | ICD-10-CM | POA: Diagnosis not present

## 2023-03-11 LAB — CBC
HCT: 35.3 % — ABNORMAL LOW (ref 36.0–46.0)
Hemoglobin: 11.6 g/dL — ABNORMAL LOW (ref 12.0–15.0)
MCH: 29.4 pg (ref 26.0–34.0)
MCHC: 32.9 g/dL (ref 30.0–36.0)
MCV: 89.6 fL (ref 80.0–100.0)
Platelets: 214 10*3/uL (ref 150–400)
RBC: 3.94 MIL/uL (ref 3.87–5.11)
RDW: 12.2 % (ref 11.5–15.5)
WBC: 11.1 10*3/uL — ABNORMAL HIGH (ref 4.0–10.5)
nRBC: 0 % (ref 0.0–0.2)

## 2023-03-11 MED ORDER — HYDROMORPHONE HCL 2 MG PO TABS: 2.0000 mg | ORAL_TABLET | Freq: Four times a day (QID) | ORAL | 0 refills | Status: AC | PRN

## 2023-03-11 NOTE — Progress Notes (Signed)
Orthopedic Tech Progress Note Patient Details:  Lisa Everett [birth year] [id number]  Patient ID: Lisa Everett, female   DOB: Feb 03, 1966, 57 y.o.   MRN: [id number]  Lisa Everett 03/11/2023, 11:26 AM Crutches sized and given to patient in room per request from PT

## 2023-03-11 NOTE — Progress Notes (Signed)
Physical Therapy Treatment Patient Details Name: Lisa Everett MRN: [id number] DOB: [birth year] Today's Date: 03/11/2023   History of Present Illness 57 yo female, S/P LTKA 03/09/23. PMH:HTN    PT Comments    The patient  demonstrates improved Mobility, requires LLE support with belt for transition. Patient  performed steps  satisfactorily, obtained crutches to use on stairs with 1 rail and 1 crutch . Will return to attempt ambulation without KI.  Recommendations for follow up therapy are one component of a multi-disciplinary discharge planning process, led by the attending physician.  Recommendations may be updated based on patient status, additional functional criteria and insurance authorization.  Follow Up Recommendations       Assistance Recommended at Discharge Intermittent Supervision/Assistance  Patient can return home with the following A little help with walking and/or transfers;Help with stairs or ramp for entrance;A little help with bathing/dressing/bathroom;Assistance with cooking/housework;Assist for transportation   Equipment Recommendations       Recommendations for Other Services       Precautions / Restrictions Precautions Precautions: Fall;Knee Required Braces or Orthoses: Knee Immobilizer - Left Knee Immobilizer - Left: Discontinue once straight leg raise with < 10 degree lag Restrictions Weight Bearing Restrictions: No     Mobility  Bed Mobility         Supine to sit: HOB elevated, Supervision     General bed mobility comments: in recliner    Transfers Overall transfer level: Needs assistance Equipment used: Rolling walker (2 wheels) Transfers: Sit to/from Stand Sit to Stand: Supervision           General transfer comment: extra time,  demonstrated use of belt and R foot to assist with lowering the Left leg and lifting the leg . Patient slowly stands from the recliner and slowly sits down, has to move right leg forward also( DJD in  knee)    Ambulation/Gait                   Stairs Stairs: Yes Stairs assistance: Min assist Stair Management: One rail Left, Forwards, With crutches Number of Stairs: 2 (x2) General stair comments: patient used 1 crutch and 1 rail, cues for sequence, patient safe and tolerated well, able to verbalize sequence while practiciing   Wheelchair Mobility    Modified Rankin (Stroke Patients Only)       Balance Overall balance assessment: Needs assistance Sitting-balance support: No upper extremity supported, Feet supported Sitting balance-Leahy Scale: Good     Standing balance support: Bilateral upper extremity supported, Reliant on assistive device for balance, During functional activity Standing balance-Leahy Scale: Fair                              Cognition Arousal/Alertness: Awake/alert                                              Exercises Total Joint Exercises Long Arc Quad: AAROM, 5 reps, Left Knee Flexion: AAROM, Left, 5 reps Goniometric ROM: 10-50 left knee    General Comments        Pertinent Vitals/Pain Pain Assessment Pain Score: 6  Pain Location: left knee Pain Descriptors / Indicators: Aching, Discomfort, Grimacing, Guarding, Crying Pain Intervention(s): Monitored during session, Premedicated before session, Ice applied    Home Living  Prior Function            PT Goals (current goals can now be found in the care plan section) Progress towards PT goals: Progressing toward goals    Frequency    7X/week      PT Plan Current plan remains appropriate    Co-evaluation              AM-PAC PT "6 Clicks" Mobility   Outcome Measure  Help needed turning from your back to your side while in a flat bed without using bedrails?: A Little Help needed moving from lying on your back to sitting on the side of a flat bed without using bedrails?: A Little Help needed  moving to and from a bed to a chair (including a wheelchair)?: A Little Help needed standing up from a chair using your arms (e.g., wheelchair or bedside chair)?: A Little Help needed to walk in hospital room?: A Little Help needed climbing 3-5 steps with a railing? : A Little 6 Click Score: 18    End of Session Equipment Utilized During Treatment: Gait belt Activity Tolerance: Patient tolerated treatment well Patient left: in chair;with call bell/phone within reach Nurse Communication: Mobility status PT Visit Diagnosis: Unsteadiness on feet (R26.81);Muscle weakness (generalized) (M62.81);Difficulty in walking, not elsewhere classified (R26.2);Pain Pain - Right/Left: Left Pain - part of body: Knee     Time: 1610-9604 PT Time Calculation (min) (ACUTE ONLY): 41 min  Charges:  $Gait Training: 23-37 mins $Therapeutic Exercise: 8-22 mins                     Blanchard Kelch PT Acute Rehabilitation Services Office [phone] Weekend pager-[phone]    Rada Hay 03/11/2023, 1:11 PM

## 2023-03-11 NOTE — Plan of Care (Signed)
  Problem: Education: Goal: Knowledge of the prescribed therapeutic regimen will improve Outcome: Adequate for Discharge Goal: Individualized Educational Video(s) Outcome: Adequate for Discharge   Problem: Activity: Goal: Ability to avoid complications of mobility impairment will improve Outcome: Adequate for Discharge Goal: Range of joint motion will improve Outcome: Adequate for Discharge   Problem: Clinical Measurements: Goal: Postoperative complications will be avoided or minimized Outcome: Adequate for Discharge   Problem: Pain Management: Goal: Pain level will decrease with appropriate interventions Outcome: Adequate for Discharge   Problem: Skin Integrity: Goal: Will show signs of wound healing Outcome: Adequate for Discharge   Problem: Education: Goal: Knowledge of General Education information will improve Description: Including pain rating scale, medication(s)/side effects and non-pharmacologic comfort measures Outcome: Adequate for Discharge   Problem: Health Behavior/Discharge Planning: Goal: Ability to manage health-related needs will improve Outcome: Adequate for Discharge   Problem: Clinical Measurements: Goal: Ability to maintain clinical measurements within normal limits will improve Outcome: Adequate for Discharge Goal: Will remain free from infection Outcome: Adequate for Discharge Goal: Diagnostic test results will improve Outcome: Adequate for Discharge Goal: Respiratory complications will improve Outcome: Adequate for Discharge Goal: Cardiovascular complication will be avoided Outcome: Adequate for Discharge   Problem: Activity: Goal: Risk for activity intolerance will decrease Outcome: Adequate for Discharge   Problem: Nutrition: Goal: Adequate nutrition will be maintained Outcome: Adequate for Discharge   Problem: Coping: Goal: Level of anxiety will decrease Outcome: Adequate for Discharge   Problem: Elimination: Goal: Will not  experience complications related to bowel motility 03/11/2023 1145 by Alice Rieger A, LPN Outcome: Adequate for Discharge 03/11/2023 1051 by Alice Rieger A, LPN Outcome: Progressing Goal: Will not experience complications related to urinary retention Outcome: Adequate for Discharge   Problem: Pain Managment: Goal: General experience of comfort will improve 03/11/2023 1145 by Alice Rieger A, LPN Outcome: Adequate for Discharge 03/11/2023 1051 by Alice Rieger A, LPN Outcome: Progressing   Problem: Safety: Goal: Ability to remain free from injury will improve 03/11/2023 1145 by Alice Rieger A, LPN Outcome: Adequate for Discharge 03/11/2023 1051 by Alice Rieger A, LPN Outcome: Progressing   Problem: Skin Integrity: Goal: Risk for impaired skin integrity will decrease Outcome: Adequate for Discharge

## 2023-03-11 NOTE — Plan of Care (Signed)
?  Problem: Elimination: ?Goal: Will not experience complications related to bowel motility ?Outcome: Progressing ?  ?Problem: Pain Managment: ?Goal: General experience of comfort will improve ?Outcome: Progressing ?  ?Problem: Safety: ?Goal: Ability to remain free from injury will improve ?Outcome: Progressing ?  ?

## 2023-03-11 NOTE — Progress Notes (Signed)
Physical Therapy Treatment Patient Details Name: Lisa Everett MRN: [id number] DOB: 1966/02/16 Today's Date: 03/11/2023   History of Present Illness 57 yo female, S/P LTKA 03/09/23. PMH:HTN    PT Comments     Attempted  assessment of mobility without KI on LLE but patient reports the knee is too sore and requesting it back in  place for ambulation.  Patient met  PT goals for Dc home today.   Recommendations for follow up therapy are one component of a multi-disciplinary discharge planning process, led by the attending physician.  Recommendations may be updated based on patient status, additional functional criteria and insurance authorization.  Follow Up Recommendations       Assistance Recommended at Discharge Intermittent Supervision/Assistance  Patient can return home with the following A little help with walking and/or transfers;Help with stairs or ramp for entrance;A little help with bathing/dressing/bathroom;Assistance with cooking/housework;Assist for transportation   Equipment Recommendations       Recommendations for Other Services       Precautions / Restrictions Precautions Precautions: Fall;Knee Precaution Comments: attempted without KI, patient unable to tolerate so replaced Required Braces or Orthoses: Knee Immobilizer - Left Knee Immobilizer - Left: Discontinue once straight leg raise with < 10 degree lag Restrictions Weight Bearing Restrictions: No     Mobility  Bed Mobility         Supine to sit: HOB elevated, Supervision     General bed mobility comments: in recliner    Transfers Overall transfer level: Needs assistance Equipment used: Rolling walker (2 wheels) Transfers: Sit to/from Stand Sit to Stand: Supervision           General transfer comment: extra time    Ambulation/Gait Ambulation/Gait assistance: Min guard Gait Distance (Feet): 20 Feet Assistive device: Rolling walker (2 wheels) Gait Pattern/deviations: Step-to  pattern, Antalgic, Decreased stance time - left       General Gait Details: patient moves slowly,  tends to lean forward   Stairs Stairs: Yes Stairs assistance: Min assist Stair Management: One rail Left, Forwards, With crutches Number of Stairs: 2 (x2) General stair comments: patient used 1 crutch and 1 rail, cues for sequence, patient safe and tolerated well, able to verbalize sequence while practiciing   Wheelchair Mobility    Modified Rankin (Stroke Patients Only)       Balance Overall balance assessment: Needs assistance Sitting-balance support: No upper extremity supported, Feet supported Sitting balance-Leahy Scale: Good     Standing balance support: Bilateral upper extremity supported, Reliant on assistive device for balance, During functional activity Standing balance-Leahy Scale: Fair                              Cognition Arousal/Alertness: Awake/alert                                              Exercises   General Comments        Pertinent Vitals/Pain Pain Assessment Pain Score: 7  Pain Location: left knee Pain Descriptors / Indicators: Aching, Discomfort, Grimacing, Guarding Pain Intervention(s): Monitored during session    Home Living                          Prior Function            PT  Goals (current goals can now be found in the care plan section) Progress towards PT goals: Progressing toward goals    Frequency    7X/week      PT Plan Current plan remains appropriate    Co-evaluation              AM-PAC PT "6 Clicks" Mobility   Outcome Measure  Help needed turning from your back to your side while in a flat bed without using bedrails?: A Little Help needed moving from lying on your back to sitting on the side of a flat bed without using bedrails?: A Little Help needed moving to and from a bed to a chair (including a wheelchair)?: A Little Help needed standing up from a chair  using your arms (e.g., wheelchair or bedside chair)?: A Little Help needed to walk in hospital room?: A Little Help needed climbing 3-5 steps with a railing? : A Little 6 Click Score: 18    End of Session Equipment Utilized During Treatment: Gait belt Activity Tolerance: Patient tolerated treatment well Patient left:  (in BR with CNA) Nurse Communication: Mobility status PT Visit Diagnosis: Unsteadiness on feet (R26.81);Muscle weakness (generalized) (M62.81);Difficulty in walking, not elsewhere classified (R26.2);Pain Pain - Right/Left: Left Pain - part of body: Knee     Time: 1610-9604 PT Time Calculation (min) (ACUTE ONLY): 20 min  Charges:  $Gait Training: 8-22 mins $Therapeutic Exercise: 8-22 mins                     Blanchard Kelch PT Acute Rehabilitation Services Office [phone] Weekend pager-[phone]    Rada Hay 03/11/2023, 1:16 PM

## 2023-03-11 NOTE — Progress Notes (Signed)
   Subjective: 2 Days Post-Op Procedure(s) (LRB): TOTAL KNEE ARTHROPLASTY (Left) Patient reports pain as mild.   Patient seen in rounds by Dr. Lequita Halt. Patient had issues with pain control yesterday, switched PO medication to dilaudid. Improved this morning. No issues overnight. Voiding without difficulty.  Objective: Vital signs in last 24 hours: Temp:  [98.1 F (36.7 C)-98.9 F (37.2 C)] 98.7 F (37.1 C) (05/15 0520) Pulse Rate:  [82-102] 97 (05/15 0520) Resp:  [16-18] 17 (05/15 0520) BP: (128-164)/(82-106) 129/82 (05/15 0520) SpO2:  [93 %-94 %] 94 % (05/15 0520)  Intake/Output from previous day:  Intake/Output Summary (Last 24 hours) at 03/11/2023 0819 Last data filed at 03/11/2023 0600 Gross per 24 hour  Intake 1951.9 ml  Output 0 ml  Net 1951.9 ml    Intake/Output this shift: No intake/output data recorded.  Labs: Recent Labs    03/10/23 0358 03/11/23 0342  HGB 11.6* 11.6*   Recent Labs    03/10/23 0358 03/11/23 0342  WBC 8.8 11.1*  RBC 3.86* 3.94  HCT 34.6* 35.3*  PLT 209 214   Recent Labs    03/10/23 0358  NA 138  K 3.4*  CL 102  CO2 26  BUN 17  CREATININE 1.09*  GLUCOSE 165*  CALCIUM 8.2*   No results for input(s): "LABPT", "INR" in the last 72 hours.  Exam: General - Patient is Alert and Oriented Extremity - Neurologically intact Neurovascular intact Sensation intact distally Dorsiflexion/Plantar flexion intact Dressing/Incision - clean, dry, no drainage Motor Function - intact, moving foot and toes well on exam.   Past Medical History:  Diagnosis Date   Allergy    pet dander   Arthritis    Asthma    Family history of adverse reaction to anesthesia    Sister has naseau and vomiting   Hot flashes    Hypertension    Hypokalemia    Vitamin D deficiency     Assessment/Plan: 2 Days Post-Op Procedure(s) (LRB): TOTAL KNEE ARTHROPLASTY (Left) Principal Problem:   OA (osteoarthritis) of knee Active Problems:   Primary  osteoarthritis of left knee  Estimated body mass index is 36.05 kg/m as calculated from the following:   Height as of this encounter: 5\' 2"  (1.575 m).   Weight as of this encounter: 89.4 kg. Up with therapy  DVT Prophylaxis - Aspirin Weight-bearing as tolerated  Discharge today once cleared with PT  Arther Abbott, PA-C Orthopedic Surgery [phone] 03/11/2023, 8:19 AM

## 2023-03-12 ENCOUNTER — Ambulatory Visit: Payer: 59 | Admitting: Physical Therapy

## 2023-03-16 NOTE — Discharge Summary (Signed)
Patient ID: Lisa Everett MRN: [id number] DOB/AGE: 27-May-1966 57 y.o.  Admit date: 03/09/2023 Discharge date: 03/11/2023  Admission Diagnoses:  Principal Problem:   OA (osteoarthritis) of knee Active Problems:   Primary osteoarthritis of left knee   Discharge Diagnoses:  Same  Past Medical History:  Diagnosis Date   Allergy    pet dander   Arthritis    Asthma    Family history of adverse reaction to anesthesia    Sister has naseau and vomiting   Hot flashes    Hypertension    Hypokalemia    Vitamin D deficiency     Surgeries: Procedure(s): TOTAL KNEE ARTHROPLASTY on 03/09/2023   Consultants:   Discharged Condition: Improved  Hospital Course: Lisa Everett is an 57 y.o. female who was admitted 03/09/2023 for operative treatment ofOA (osteoarthritis) of knee. Patient has severe unremitting pain that affects sleep, daily activities, and work/hobbies. After pre-op clearance the patient was taken to the operating room on 03/09/2023 and underwent  Procedure(s): TOTAL KNEE ARTHROPLASTY.    Patient was given perioperative antibiotics:  Anti-infectives (From admission, onward)    Start     Dose/Rate Route Frequency Ordered Stop   03/09/23 1400  ceFAZolin (ANCEF) IVPB 2g/100 mL premix        2 g 200 mL/hr over 30 Minutes Intravenous Every 6 hours 03/09/23 1209 03/09/23 2131   03/09/23 0615  ceFAZolin (ANCEF) IVPB 2g/100 mL premix        2 g 200 mL/hr over 30 Minutes Intravenous On call to O.R. 03/09/23 0603 03/09/23 0840        Patient was given sequential compression devices, early ambulation, and chemoprophylaxis to prevent DVT.  Patient benefited maximally from hospital stay and there were no complications.    Recent vital signs: No data found.   Recent laboratory studies: No results for input(s): "WBC", "HGB", "HCT", "PLT", "NA", "K", "CL", "CO2", "BUN", "CREATININE", "GLUCOSE", "INR", "CALCIUM" in the last 72 hours.  Invalid input(s): "PT",  "2"   Discharge Medications:   Allergies as of 03/11/2023       Reactions   Other    Pet dandruff -- pt stated, "My eyes swelled, itching and redness"        Medication List     STOP taking these medications    aspirin 81 MG tablet Replaced by: aspirin 81 MG chewable tablet       TAKE these medications    albuterol 108 (90 Base) MCG/ACT inhaler Commonly known as: VENTOLIN HFA Inhale 1-2 puffs into the lungs every 6 (six) hours as needed for shortness of breath or wheezing.   aspirin 81 MG chewable tablet Take 81 mg Aspirin two times a day for three weeks following surgery. Then resume your normal 81mg  aspirin once a day Replaces: aspirin 81 MG tablet   cetirizine 10 MG tablet Commonly known as: ZYRTEC Take 10 mg by mouth at bedtime.   fluticasone 50 MCG/ACT nasal spray Commonly known as: FLONASE Place 1-2 sprays into both nostrils daily for 7 days.   gabapentin 300 MG capsule Commonly known as: NEURONTIN Take a 300 mg capsule three times a day for two weeks following surgery.Then take a 300 mg capsule two times a day for two weeks. Then take a 300 mg capsule once a day for two weeks. Then discontinue.   GLUCOSAMINE PO Take 1 tablet by mouth daily.   HYDROmorphone 2 MG tablet Commonly known as: DILAUDID Take 1-2 tablets (2-4 mg total) by mouth every  6 (six) hours as needed for severe pain.   lisinopril-hydrochlorothiazide 20-12.5 MG tablet Commonly known as: ZESTORETIC TAKE 1 TABLET BY MOUTH DAILY   meloxicam 15 MG tablet Commonly known as: MOBIC Take 15 mg by mouth daily as needed for pain.   Potassium Chloride ER 20 MEQ Tbcr Take 20 mEq by mouth daily.   traMADol 50 MG tablet Commonly known as: ULTRAM Take 1-2 tablets (50-100 mg total) by mouth every 6 (six) hours as needed for moderate pain. What changed: how much to take   vitamin B-12 500 MCG tablet Commonly known as: CYANOCOBALAMIN Take 500 mcg by mouth daily.   Vitamin D (Ergocalciferol)  1.25 MG (50000 UNIT) Caps capsule Commonly known as: DRISDOL Take 50,000 Units by mouth once a week.               Discharge Care Instructions  (From admission, onward)           Start     Ordered   03/10/23 0000  Weight bearing as tolerated        03/10/23 0821   03/10/23 0000  Change dressing       Comments: You may remove the bulky bandage (ACE wrap and gauze) two days after surgery. You will have an adhesive waterproof bandage underneath. Leave this in place until your first follow-up appointment.   03/10/23 0821            Diagnostic Studies: No results found.  Disposition: Discharge disposition: 01-Home or Self Care       Discharge Instructions     Call MD / Call 911   Complete by: As directed    If you experience chest pain or shortness of breath, CALL 911 and be transported to the hospital emergency room.  If you develope a fever above 101 F, pus (white drainage) or increased drainage or redness at the wound, or calf pain, call your surgeon's office.   Change dressing   Complete by: As directed    You may remove the bulky bandage (ACE wrap and gauze) two days after surgery. You will have an adhesive waterproof bandage underneath. Leave this in place until your first follow-up appointment.   Constipation Prevention   Complete by: As directed    Drink plenty of fluids.  Prune juice may be helpful.  You may use a stool softener, such as Colace (over the counter) 100 mg twice a day.  Use MiraLax (over the counter) for constipation as needed.   Diet general   Complete by: As directed    Do not put a pillow under the knee. Place it under the heel.   Complete by: As directed    Driving restrictions   Complete by: As directed    No driving for two weeks   Post-operative opioid taper instructions:   Complete by: As directed    POST-OPERATIVE OPIOID TAPER INSTRUCTIONS: It is important to wean off of your opioid medication as soon as possible. If you do not  need pain medication after your surgery it is ok to stop day one. Opioids include: Codeine, Hydrocodone(Norco, Vicodin), Oxycodone(Percocet, oxycontin) and hydromorphone amongst others.  Long term and even short term use of opiods can cause: Increased pain response Dependence Constipation Depression Respiratory depression And more.  Withdrawal symptoms can include Flu like symptoms Nausea, vomiting And more Techniques to manage these symptoms Hydrate well Eat regular healthy meals Stay active Use relaxation techniques(deep breathing, meditating, yoga) Do Not substitute Alcohol to help with  tapering If you have been on opioids for less than two weeks and do not have pain than it is ok to stop all together.  Plan to wean off of opioids This plan should start within one week post op of your joint replacement. Maintain the same interval or time between taking each dose and first decrease the dose.  Cut the total daily intake of opioids by one tablet each day Next start to increase the time between doses. The last dose that should be eliminated is the evening dose.      TED hose   Complete by: As directed    Use stockings (TED hose) for three weeks on both leg(s).  You may remove them at night for sleeping.   Weight bearing as tolerated   Complete by: As directed         Follow-up Information     Aluisio, Homero Fellers, MD. Schedule an appointment as soon as possible for a visit in 2 week(s).   Specialty: Orthopedic Surgery Contact information: [address] [place] [address] [phone]                  Signed: Arther Abbott 03/16/2023, 9:51 AM

## 2023-03-19 ENCOUNTER — Other Ambulatory Visit: Payer: Self-pay

## 2023-03-19 ENCOUNTER — Ambulatory Visit: Payer: 59 | Attending: Orthopedic Surgery | Admitting: Physical Therapy

## 2023-03-19 ENCOUNTER — Encounter: Payer: Self-pay | Admitting: Physical Therapy

## 2023-03-19 DIAGNOSIS — M25561 Pain in right knee: Secondary | ICD-10-CM | POA: Diagnosis present

## 2023-03-19 DIAGNOSIS — G8929 Other chronic pain: Secondary | ICD-10-CM | POA: Diagnosis present

## 2023-03-19 DIAGNOSIS — Z96652 Presence of left artificial knee joint: Secondary | ICD-10-CM | POA: Diagnosis present

## 2023-03-19 DIAGNOSIS — R2689 Other abnormalities of gait and mobility: Secondary | ICD-10-CM | POA: Insufficient documentation

## 2023-03-19 DIAGNOSIS — R600 Localized edema: Secondary | ICD-10-CM | POA: Diagnosis present

## 2023-03-19 DIAGNOSIS — M25562 Pain in left knee: Secondary | ICD-10-CM | POA: Diagnosis present

## 2023-03-19 DIAGNOSIS — M6281 Muscle weakness (generalized): Secondary | ICD-10-CM | POA: Insufficient documentation

## 2023-03-19 NOTE — Therapy (Signed)
OUTPATIENT PHYSICAL THERAPY LOWER EXTREMITY EVALUATION   Patient Name: Lisa Everett MRN: [id number] DOB:12/12/1965, 57 y.o., female Today's Date: 03/19/2023  END OF SESSION:  PT End of Session - 03/19/23 1402     Visit Number 1    Number of Visits 14    Date for PT Re-Evaluation 05/14/23    Authorization Type [employer] -- 23 visit limit for the whole year    Authorization - Visit Number 9    Authorization - Number of Visits 23    PT Start Time 1400    PT Stop Time 1445    PT Time Calculation (min) 45 min    Activity Tolerance Patient tolerated treatment well              Past Medical History:  Diagnosis Date   Allergy    pet dander   Arthritis    Asthma    Family history of adverse reaction to anesthesia    Sister has naseau and vomiting   Hot flashes    Hypertension    Hypokalemia    Vitamin D deficiency    Past Surgical History:  Procedure Laterality Date   CATARACT EXTRACTION Bilateral    CESAREAN SECTION     HERNIA REPAIR     x3 3rd with Dr. Sheliah Hatch 07-02-18   INCISIONAL HERNIA REPAIR N/A 07/02/2018   Procedure: HERNIA REPAIR INCISIONAL OPEN RECURRENT;  Surgeon: Kinsinger, De Blanch, MD;  Location: [facility];  Service: General;  Laterality: N/A;   INSERTION OF MESH N/A 07/02/2018   Procedure: INSERTION OF MESH;  Surgeon: Sheliah Hatch De Blanch, MD;  Location: [facility];  Service: General;  Laterality: N/A;   LAPAROTOMY N/A 07/03/2018   Procedure: EXPLORATORY LAPAROTOMY, RE-OPENING OF INCISION AND REMOVAL OF HEMATOMA;  Surgeon: Sheliah Hatch De Blanch, MD;  Location: [facility];  Service: General;  Laterality: N/A;   TOTAL KNEE ARTHROPLASTY Left 03/09/2023   Procedure: TOTAL KNEE ARTHROPLASTY;  Surgeon: Ollen Gross, MD;  Location: [facility];  Service: Orthopedics;  Laterality: Left;   WISDOM TOOTH EXTRACTION     Patient Active Problem List   Diagnosis Date Noted   OA (osteoarthritis) of knee 03/09/2023   Primary osteoarthritis of left knee 03/09/2023    Incisional hernia 07/02/2018    PCP: n/a  REFERRING PROVIDER: Ollen Gross, MD  REFERRING DIAG: M17.12 Unilateral primary osteoarthritis, left knee  THERAPY DIAG:  Status post left knee replacement - Plan: PT plan of care cert/re-cert  Chronic pain of left knee - Plan: PT plan of care cert/re-cert  Chronic pain of right knee - Plan: PT plan of care cert/re-cert  Muscle weakness (generalized) - Plan: PT plan of care cert/re-cert  Other abnormalities of gait and mobility - Plan: PT plan of care cert/re-cert  Localized edema  Rationale for Evaluation and Treatment: Rehabilitation  ONSET DATE: 03/09/23 L TKA  SUBJECTIVE:   SUBJECTIVE STATEMENT: Pt reports she ended up staying 2 days at the hospital due to pain. Pt stayed with daughter for a few days. Pt reports she has not been consistent with her exercises form the hospital. Has been icing at home multiple times. Pt does feel swelling has been decreasing. Has been using RW around the house. Pt states she will hold off from doing her R knee until next year.   PERTINENT HISTORY: Chronic knee OA  PAIN:  Are you having pain? Yes: NPRS scale: 4 currently, at worst 10/10 Pain location: ant knee Pain description: Dull ache Aggravating factors: Cold weather, rain, being  on my feet a lot, stairs Relieving factors: Heat/ice, pain medication  PRECAUTIONS: None  WEIGHT BEARING RESTRICTIONS: No  FALLS:  Has patient fallen in last 6 months? 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)  PLOF: Independent  PATIENT GOALS: Improve pain for return to all normal function/work  NEXT MD VISIT: 03/24/23  OBJECTIVE:   DIAGNOSTIC FINDINGS: None noted in chart  PATIENT SURVEYS:  FOTO 35; predicted 59  COGNITION: Overall cognitive status:  Within functional limits for tasks assessed     SENSATION: WNL  EDEMA:  21" on L, 18" on R  MUSCLE LENGTH: Hamstrings: Right 80 deg; Left unable due to pt guarding/pain  POSTURE: increased lumbar lordosis  PALPATION: TTP medial knee  LOWER EXTREMITY ROM:  ROM Right 11/04/22 Left 11/04/22 Left Eval   Hip flexion     Hip extension     Hip abduction     Hip adduction     Hip internal rotation     Hip external rotation     Knee flexion 128 A/PROM 120 A/PROM 80 AROM   Knee extension -10 AROM 0 PROM -34 AROM 0 PROM -25 AROM  Ankle dorsiflexion     Ankle plantarflexion     Ankle inversion     Ankle eversion      (Blank rows = not tested)  LOWER EXTREMITY MMT:  MMT Right eval Left eval  Hip flexion 5 2  Hip extension  3-  Hip abduction  3  Hip adduction    Hip internal rotation    Hip external rotation    Knee flexion 3+ 3  Knee extension 4+ 2  Ankle dorsiflexion    Ankle plantarflexion    Ankle inversion    Ankle eversion     (Blank rows = not tested)   FUNCTIONAL TESTS:  5x STS TBA (limited due to pt pain)  GAIT: Distance walked: 50'  Assistive device utilized: Environmental consultant - 2 wheeled Level of assistance: CGA Comments: flexed trunk, wide BOS, decreased L LE weight shift/weight bearing, maintains knee in mostly extension, increased L lateral trunk lean   TODAY'S TREATMENT:                                                                                                                              DATE: 03/19/23  See HEP Vaso, 34 deg, knee, x10 min, low pressure  PATIENT EDUCATION:  Education details: Exam findings, POC, initial HEP. Reiterated importance of performing HEP at home especially within the first few weeks Person educated: Patient Education method: Explanation, Demonstration, and Handouts Education comprehension: verbalized understanding, returned demonstration, and needs further education  HOME EXERCISE PROGRAM: Access Code: [email or web address] URL:  [email or web address] Date: 03/19/2023 Prepared by: Vernon Prey April Kirstie Peri  Exercises - Supine Heel Slide with  Strap  - 1 x daily - 7 x weekly - 1 sets - 10 reps - Supine Quad Set  - 1 x daily - 7 x weekly - 2-3 sets - 10 reps - 3 sec hold - Supine Knee Extension Strengthening  - 1 x daily - 7 x weekly - 2 sets - 10 reps - Supine Gluteal Sets  - 1 x daily - 7 x weekly - 2 sets - 10 reps - Hooklying Isometric Clamshell  - 1 x daily - 7 x weekly - 2 sets - 10 reps  ASSESSMENT:  CLINICAL IMPRESSION: Patient is a 57 y.o. F who was seen today for physical therapy evaluation and treatment s/p L knee replacement on 03/09/23. Assessment significant for decreased L knee ROM, decreased L LE strength with poor quad contraction, and pain affecting standing and amb. Pt will benefit from PT to address these deficits for return to all normal activities.   OBJECTIVE IMPAIRMENTS: Abnormal gait, decreased activity tolerance, decreased balance, decreased endurance, decreased mobility, difficulty walking, decreased ROM, decreased strength, impaired flexibility, and pain.   ACTIVITY LIMITATIONS: lifting, squatting, stairs, transfers, and locomotion level  PARTICIPATION LIMITATIONS: cleaning, laundry, driving, shopping, community activity, and occupation  PERSONAL FACTORS: Fitness, Past/current experiences, and Time since onset of injury/illness/exacerbation are also affecting patient's functional outcome.   REHAB POTENTIAL: Good  CLINICAL DECISION MAKING: Stable/uncomplicated  EVALUATION COMPLEXITY: Low  GOALS: Goals reviewed with patient? Yes  SHORT TERM GOALS: Target date: 04/16/2023    Patient will be independent with initial HEP. Baseline:  Goal status: INITIAL  2.  Pt will have improved L knee AROM from 10 to 100 deg Baseline:  Goal status: INITIAL    LONG TERM GOALS: Target date: 05/14/2023    Patient will be independent with advanced/ongoing HEP to improve outcomes  and carryover.  Baseline:  Goal status: INITIAL  2.  Patient will report at least 50% improvement in bilat knee pain to improve QOL. Baseline:  Goal status: INITIAL  3.  Patient will have improved L knee AROM from >/= 0 to 115 deg to safely ascend/descend stairs Baseline:  Goal status: INITIAL  4.  Patient will demonstrate improved functional LE strength as demonstrated by 5x STS </=13 sec without UE support. Baseline:  Goal status: INITIAL  5.  Patient will be able to ambulate 1000' with LRAD and normal gait pattern without increased pain to access community.  Baseline:  Goal status: INITIAL  6. Patient will have improved FOTO score of >/=59 Baseline:  Goal status: INITIAL    PLAN:  PT FREQUENCY: 2x/week with plans to decrease to 1x/wk (she only has 23 visits for the whole year and she has already used 9 visits during last PT episode)  PT DURATION: 8 weeks  PLANNED INTERVENTIONS: Therapeutic exercises, Therapeutic activity, Neuromuscular re-education, Balance training, Gait training, Patient/Family education, Self Care, Joint mobilization, Stair training, Aquatic Therapy, Dry Needling, Electrical stimulation, Cryotherapy, Moist heat, Taping, Vasopneumatic device, Ionotophoresis 4mg /ml Dexamethasone, Manual therapy, and Re-evaluation  PLAN FOR NEXT SESSION: Assess response to HEP. Progress as able. Knee ROM/stretching.    Tory Mckissack April Ma L Venda Dice, PT 03/19/2023, 3:54 PM

## 2023-03-25 ENCOUNTER — Encounter: Payer: Self-pay | Admitting: Physical Therapy

## 2023-03-25 ENCOUNTER — Ambulatory Visit: Payer: 59 | Admitting: Physical Therapy

## 2023-03-25 DIAGNOSIS — Z96652 Presence of left artificial knee joint: Secondary | ICD-10-CM | POA: Diagnosis not present

## 2023-03-25 DIAGNOSIS — M6281 Muscle weakness (generalized): Secondary | ICD-10-CM

## 2023-03-25 DIAGNOSIS — R2689 Other abnormalities of gait and mobility: Secondary | ICD-10-CM

## 2023-03-25 DIAGNOSIS — R600 Localized edema: Secondary | ICD-10-CM

## 2023-03-25 NOTE — Therapy (Signed)
OUTPATIENT PHYSICAL THERAPY LOWER EXTREMITY EVALUATION   Patient Name: Lisa Everett MRN: [id number] DOB:05-15-1966, 57 y.o., female Today's Date: 03/25/2023  END OF SESSION:  PT End of Session - 03/25/23 1142     Visit Number 2    Number of Visits 14    Date for PT Re-Evaluation 05/14/23    Authorization Type [employer] -- 23 visit limit for the whole year    Authorization - Visit Number 11    Authorization - Number of Visits 23    PT Start Time 1055    PT Stop Time 1142    PT Time Calculation (min) 47 min    Activity Tolerance Patient tolerated treatment well    Behavior During Therapy WFL for tasks assessed/performed               Past Medical History:  Diagnosis Date   Allergy    pet dander   Arthritis    Asthma    Family history of adverse reaction to anesthesia    Sister has naseau and vomiting   Hot flashes    Hypertension    Hypokalemia    Vitamin D deficiency    Past Surgical History:  Procedure Laterality Date   CATARACT EXTRACTION Bilateral    CESAREAN SECTION     HERNIA REPAIR     x3 3rd with Dr. Sheliah Hatch 07-02-18   INCISIONAL HERNIA REPAIR N/A 07/02/2018   Procedure: HERNIA REPAIR INCISIONAL OPEN RECURRENT;  Surgeon: Kinsinger, De Blanch, MD;  Location: [facility];  Service: General;  Laterality: N/A;   INSERTION OF MESH N/A 07/02/2018   Procedure: INSERTION OF MESH;  Surgeon: Sheliah Hatch De Blanch, MD;  Location: [facility];  Service: General;  Laterality: N/A;   LAPAROTOMY N/A 07/03/2018   Procedure: EXPLORATORY LAPAROTOMY, RE-OPENING OF INCISION AND REMOVAL OF HEMATOMA;  Surgeon: Sheliah Hatch De Blanch, MD;  Location: [facility];  Service: General;  Laterality: N/A;   TOTAL KNEE ARTHROPLASTY Left 03/09/2023   Procedure: TOTAL KNEE ARTHROPLASTY;  Surgeon: Ollen Gross, MD;  Location: [facility];  Service: Orthopedics;  Laterality: Left;   WISDOM TOOTH EXTRACTION     Patient Active Problem List   Diagnosis Date Noted   OA (osteoarthritis) of knee  03/09/2023   Primary osteoarthritis of left knee 03/09/2023   Incisional hernia 07/02/2018    PCP: n/a  REFERRING PROVIDER: Ollen Gross, MD  REFERRING DIAG: M17.12 Unilateral primary osteoarthritis, left knee  THERAPY DIAG:  Status post left knee replacement  Muscle weakness (generalized)  Localized edema  Other abnormalities of gait and mobility  Rationale for Evaluation and Treatment: Rehabilitation  ONSET DATE: 03/09/23 L TKA  SUBJECTIVE:   SUBJECTIVE STATEMENT: Pt states she continues with significant soreness but that it is "getting better every day". Pt states she spoke with PA yesterday and that she wants to work on stair negotiation and knee extension  PERTINENT HISTORY: Chronic knee OA  PAIN:  Are you having pain? Yes: NPRS scale: 3 currently, at worst 10/10 Pain location: ant knee Pain description: Dull ache Aggravating factors: Cold weather, rain, being on my feet a lot, stairs Relieving factors: Heat/ice, pain medication  PRECAUTIONS: None  WEIGHT BEARING RESTRICTIONS: No  FALLS:  Has patient fallen in last 6 months? 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)  PLOF: Independent  PATIENT  GOALS: Improve pain for return to all normal function/work  NEXT MD VISIT: 03/24/23  OBJECTIVE:    PATIENT SURVEYS:  FOTO 35; predicted 59  EDEMA:  21" on L, 18" on R  LOWER EXTREMITY ROM:  ROM Right 11/04/22 Left 11/04/22 Left Eval   Hip flexion     Hip extension     Hip abduction     Hip adduction     Hip internal rotation     Hip external rotation     Knee flexion 128 A/PROM 120 A/PROM 80 AROM   Knee extension -10 AROM 0 PROM -34 AROM 0 PROM -25 AROM  Ankle dorsiflexion     Ankle plantarflexion     Ankle inversion     Ankle eversion      (Blank  rows = not tested)  LOWER EXTREMITY MMT:  MMT Right eval Left eval  Hip flexion 5 2  Hip extension  3-  Hip abduction  3  Hip adduction    Hip internal rotation    Hip external rotation    Knee flexion 3+ 3  Knee extension 4+ 2  Ankle dorsiflexion    Ankle plantarflexion    Ankle inversion    Ankle eversion     (Blank rows = not tested)   FUNCTIONAL TESTS:  5x STS TBA (limited due to pt pain)  GAIT: Distance walked: 50'  Assistive device utilized: Environmental consultant - 2 wheeled Level of assistance: CGA Comments: flexed trunk, wide BOS, decreased L LE weight shift/weight bearing, maintains knee in mostly extension, increased L lateral trunk lean   TODAY'S TREATMENT:                                                                                                                              [facility] Adult PT Treatment:                                                DATE: 03/25/23 Therapeutic Exercise: Nustep L5 x 5 min for warm up and AAROM Stair negotiation 4'' steps with 1 rail on Lt with cues for sequencing Seated LAQ with tapping for quad activation 2 x 8 SAQ initially with tactile cues, progressed to AROM 2 x 10 Supine quad set - requires max tactile and verbal cues for mm contraction Supine heel slide with strap x 10 Supine clam red TB x 20  Modalities: Vaso x 10 min 34 degrees low pressure   DATE: 03/19/23  See HEP Vaso, 34 deg, knee, x10 min, low pressure  PATIENT EDUCATION:  Education details: Exam findings, POC, initial HEP. Reiterated importance of performing HEP at home especially within the first few weeks Person educated: Patient Education method: Explanation, Demonstration, and Handouts Education comprehension: verbalized understanding, returned demonstration, and needs further education  HOME EXERCISE PROGRAM: Access Code: [email or web address]  URL: [email or web address] Date: 03/25/2023 Prepared by: Reggy Eye  Exercises - Supine Heel Slide with Strap   - 1 x daily - 7 x weekly - 1 sets - 10 reps - Supine Quad Set  - 1 x daily - 7 x weekly - 2-3 sets - 10 reps - 3 sec hold - Supine Knee Extension Strengthening  - 1 x daily - 7 x weekly - 2 sets - 10 reps - Supine Gluteal Sets  - 1 x daily - 7 x weekly - 2 sets - 10 reps - Hooklying Isometric Clamshell  - 1 x daily - 7 x weekly - 2 sets - 10 reps  Patient Education - Going Up and Down Stairs With Two Rails After Surgery  ASSESSMENT:  CLINICAL IMPRESSION: Pt continues with difficulty with quad contraction but she is improving tolerance to passive and active assisted knee extension. She requires encouragement throughout session due to pain/discomfort  OBJECTIVE IMPAIRMENTS: Abnormal gait, decreased activity tolerance, decreased balance, decreased endurance, decreased mobility, difficulty walking, decreased ROM, decreased strength, impaired flexibility, and pain.     GOALS: Goals reviewed with patient? Yes  SHORT TERM GOALS: Target date: 04/16/2023    Patient will be independent with initial HEP. Baseline:  Goal status: INITIAL  2.  Pt will have improved L knee AROM from 10 to 100 deg Baseline:  Goal status: INITIAL    LONG TERM GOALS: Target date: 05/14/2023    Patient will be independent with advanced/ongoing HEP to improve outcomes and carryover.  Baseline:  Goal status: INITIAL  2.  Patient will report at least 50% improvement in bilat knee pain to improve QOL. Baseline:  Goal status: INITIAL  3.  Patient will have improved L knee AROM from >/= 0 to 115 deg to safely ascend/descend stairs Baseline:  Goal status: INITIAL  4.  Patient will demonstrate improved functional LE strength as demonstrated by 5x STS </=13 sec without UE support. Baseline:  Goal status: INITIAL  5.  Patient will be able to ambulate 1000' with LRAD and normal gait pattern without increased pain to access community.  Baseline:  Goal status: INITIAL  6. Patient will have improved FOTO  score of >/=59 Baseline:  Goal status: INITIAL    PLAN:  PT FREQUENCY: 2x/week with plans to decrease to 1x/wk (she only has 23 visits for the whole year and she has already used 9 visits during last PT episode)  PT DURATION: 8 weeks  PLANNED INTERVENTIONS: Therapeutic exercises, Therapeutic activity, Neuromuscular re-education, Balance training, Gait training, Patient/Family education, Self Care, Joint mobilization, Stair training, Aquatic Therapy, Dry Needling, Electrical stimulation, Cryotherapy, Moist heat, Taping, Vasopneumatic device, Ionotophoresis 4mg /ml Dexamethasone, Manual therapy, and Re-evaluation  PLAN FOR NEXT SESSION: gait/stairs, Knee ROM/stretching.    Dayne Chait, PT 03/25/2023, 11:43 AM

## 2023-03-27 ENCOUNTER — Ambulatory Visit: Payer: 59 | Admitting: Rehabilitative and Restorative Service Providers"

## 2023-03-27 ENCOUNTER — Encounter: Payer: Self-pay | Admitting: Rehabilitative and Restorative Service Providers"

## 2023-03-27 DIAGNOSIS — R2689 Other abnormalities of gait and mobility: Secondary | ICD-10-CM

## 2023-03-27 DIAGNOSIS — R600 Localized edema: Secondary | ICD-10-CM

## 2023-03-27 DIAGNOSIS — Z96652 Presence of left artificial knee joint: Secondary | ICD-10-CM | POA: Diagnosis not present

## 2023-03-27 DIAGNOSIS — M6281 Muscle weakness (generalized): Secondary | ICD-10-CM

## 2023-03-27 NOTE — Therapy (Signed)
OUTPATIENT PHYSICAL THERAPY LOWER EXTREMITY EVALUATION   Patient Name: Lisa Everett MRN: [id number] DOB:[birth year], 57 y.o., female Today's Date: 03/27/2023  END OF SESSION:  PT End of Session - 03/27/23 1103     Visit Number 3    Number of Visits 14    Date for PT Re-Evaluation 05/14/23    Authorization Type [employer] -- 23 visit limit for the whole year    Authorization - Number of Visits 23    PT Start Time 1104    PT Stop Time 1150    PT Time Calculation (min) 46 min    Activity Tolerance Patient tolerated treatment well    Behavior During Therapy WFL for tasks assessed/performed             Past Medical History:  Diagnosis Date   Allergy    pet dander   Arthritis    Asthma    Family history of adverse reaction to anesthesia    Sister has naseau and vomiting   Hot flashes    Hypertension    Hypokalemia    Vitamin D deficiency    Past Surgical History:  Procedure Laterality Date   CATARACT EXTRACTION Bilateral    CESAREAN SECTION     HERNIA REPAIR     x3 3rd with Dr. Sheliah Hatch 07-02-18   INCISIONAL HERNIA REPAIR N/A 07/02/2018   Procedure: HERNIA REPAIR INCISIONAL OPEN RECURRENT;  Surgeon: Kinsinger, De Blanch, MD;  Location: [facility];  Service: General;  Laterality: N/A;   INSERTION OF MESH N/A 07/02/2018   Procedure: INSERTION OF MESH;  Surgeon: Sheliah Hatch De Blanch, MD;  Location: [facility];  Service: General;  Laterality: N/A;   LAPAROTOMY N/A 07/03/2018   Procedure: EXPLORATORY LAPAROTOMY, RE-OPENING OF INCISION AND REMOVAL OF HEMATOMA;  Surgeon: Sheliah Hatch De Blanch, MD;  Location: [facility];  Service: General;  Laterality: N/A;   TOTAL KNEE ARTHROPLASTY Left 03/09/2023   Procedure: TOTAL KNEE ARTHROPLASTY;  Surgeon: Ollen Gross, MD;  Location: [facility];  Service: Orthopedics;  Laterality: Left;   WISDOM TOOTH EXTRACTION     Patient Active Problem List   Diagnosis Date Noted   OA (osteoarthritis) of knee 03/09/2023   Primary osteoarthritis of left  knee 03/09/2023   Incisional hernia 07/02/2018    PCP: n/a REFERRING PROVIDER: Ollen Gross, MD REFERRING DIAG: M17.12 Unilateral primary osteoarthritis, left knee THERAPY DIAG:  Status post left knee replacement  Muscle weakness (generalized)  Localized edema  Other abnormalities of gait and mobility  Rationale for Evaluation and Treatment: Rehabilitation  ONSET DATE: 03/09/23 L TKA  SUBJECTIVE:  SUBJECTIVE STATEMENT: Pt was able to go upstairs with help this morning to get her shower. This is improvement since focusing on steps last session.  PERTINENT HISTORY: Chronic knee OA  PAIN:  Are you having pain? Yes: NPRS scale: 2 currently, at worst 8/10 Pain location: ant knee Pain description: Dull ache Aggravating factors: Cold weather, rain, being on my feet a lot, stairs Relieving factors: Heat/ice, pain medication  PRECAUTIONS: None  WEIGHT BEARING RESTRICTIONS: No  FALLS:  Has patient fallen in last 6 months? 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)  PATIENT GOALS: Improve pain for return to all normal function/work  NEXT MD VISIT: 03/24/23  OBJECTIVE:  (Measures in this section from initial evaluation unless otherwise noted) PATIENT  SURVEYS:  FOTO 35; predicted 59  EDEMA:  21" on L, 18" on R  LOWER EXTREMITY ROM: ROM Right 11/04/22 Left 11/04/22 Left Eval  Left 03/27/23  Hip flexion      Hip extension      Hip abduction      Hip adduction      Hip internal rotation      Hip external rotation      Knee flexion 128 A/PROM 120 A/PROM 80 AROM  88 AAROM  Knee extension -10 AROM 0 PROM -34 AROM 0 PROM -25 AROM -15  AROM in LAQ  Ankle dorsiflexion      Ankle plantarflexion      Ankle inversion      Ankle eversion       (Blank rows = not  tested)  LOWER EXTREMITY MMT: MMT Right eval Left eval  Hip flexion 5 2  Hip extension  3-  Hip abduction  3  Hip adduction    Hip internal rotation    Hip external rotation    Knee flexion 3+ 3  Knee extension 4+ 2  Ankle dorsiflexion    Ankle plantarflexion    Ankle inversion    Ankle eversion     (Blank rows = not tested)  FUNCTIONAL TESTS:  5x STS TBA (limited due to pt pain)  GAIT: Distance walked: 50'  Assistive device utilized: Environmental consultant - 2 wheeled Level of assistance: CGA Comments: flexed trunk, wide BOS, decreased L LE weight shift/weight bearing, maintains knee in mostly extension, increased L lateral trunk lean   [facility] Adult PT Treatment:                                                DATE: 03/27/23 Therapeutic Exercise: Bike x 4 minutes rocking ant/posterior for AAROM knee Seated Gentle leg hangs with kicking for [employer] set with assist from PT engaging quad via tapping and end range assist to engage quad AAROM knee flexion/extension with pillowcase on floor x 12 reps Supine Heel slides x 10 reps L knee to chest with PT assisting to hold leg for gravity assisted flexion HS PROM stretch SLR with PT assist -- unable to lift SAQ with cues to maintain contact with bolster behind knee x 12 reps Gait: With glut set before walking Tactile cues for L hip initiation X 80 feet with RW x 2 reps Stairs x 4 with handrail on one side and step to pattern Modalities: Vaso x 10 minutes L knee x 34 degrees                                                                                                                    [facility] Adult PT Treatment:  DATE: 03/25/23 Therapeutic Exercise: Nustep L5 x 5 min for warm up and AAROM Stair negotiation 4'' steps with 1 rail on Lt with cues for sequencing Seated LAQ with tapping for quad activation 2 x 8 SAQ initially with tactile cues, progressed to AROM 2 x 10 Supine quad set - requires  max tactile and verbal cues for mm contraction Supine heel slide with strap x 10 Supine clam red TB x 20 Modalities: Vaso x 10 min 34 degrees low pressure  PATIENT EDUCATION:  Education details: Exam findings, POC, initial HEP. Reiterated importance of performing HEP at home especially within the first few weeks Person educated: Patient Education method: Explanation, Demonstration, and Handouts Education comprehension: verbalized understanding, returned demonstration, and needs further education  HOME EXERCISE PROGRAM: Access Code: [email or web address] URL: [email or web address] Date: 03/25/2023 Prepared by: Reggy Eye  Exercises - Supine Heel Slide with Strap  - 1 x daily - 7 x weekly - 1 sets - 10 reps - Supine Quad Set  - 1 x daily - 7 x weekly - 2-3 sets - 10 reps - 3 sec hold - Supine Knee Extension Strengthening  - 1 x daily - 7 x weekly - 2 sets - 10 reps - Supine Gluteal Sets  - 1 x daily - 7 x weekly - 2 sets - 10 reps - Hooklying Isometric Clamshell  - 1 x daily - 7 x weekly - 2 sets - 10 reps  Patient Education - Going Up and Down Stairs With Two Rails After Surgery  ASSESSMENT:  CLINICAL IMPRESSION: The patient is continuing with pain throughout session. With standing tasks, she maintains flexion and PT cues for glut set to engage upright trunk before gait and stairs. Plan to continue to progress to patient tolerance.  OBJECTIVE IMPAIRMENTS: Abnormal gait, decreased activity tolerance, decreased balance, decreased endurance, decreased mobility, difficulty walking, decreased ROM, decreased strength, impaired flexibility, and pain.   GOALS: Goals reviewed with patient? Yes  SHORT TERM GOALS: Target date: 04/16/2023  Patient will be independent with initial HEP. Baseline:  Goal status: INITIAL  2.  Pt will have improved L knee AROM from 10 to 100 deg Baseline:  Goal status: INITIAL  LONG TERM GOALS: Target date: 05/14/2023  Patient will be independent  with advanced/ongoing HEP to improve outcomes and carryover.  Baseline:  Goal status: INITIAL  2.  Patient will report at least 50% improvement in bilat knee pain to improve QOL. Baseline:  Goal status: INITIAL  3.  Patient will have improved L knee AROM from >/= 0 to 115 deg to safely ascend/descend stairs Baseline:  Goal status: INITIAL  4.  Patient will demonstrate improved functional LE strength as demonstrated by 5x STS </=13 sec without UE support. Baseline:  Goal status: INITIAL  5.  Patient will be able to ambulate 1000' with LRAD and normal gait pattern without increased pain to access community.  Baseline:  Goal status: INITIAL  6. Patient will have improved FOTO score of >/=59 Baseline:  Goal status: INITIAL  PLAN:  PT FREQUENCY: 2x/week with plans to decrease to 1x/wk (she only has 23 visits for the whole year and she has already used 9 visits during last PT episode)  PT DURATION: 8 weeks  PLANNED INTERVENTIONS: Therapeutic exercises, Therapeutic activity, Neuromuscular re-education, Balance training, Gait training, Patient/Family education, Self Care, Joint mobilization, Stair training, Aquatic Therapy, Dry Needling, Electrical stimulation, Cryotherapy, Moist heat, Taping, Vasopneumatic device, Ionotophoresis 4mg /ml Dexamethasone, Manual therapy, and Re-evaluation  PLAN FOR NEXT SESSION: gait/stairs, Knee ROM/stretching.  Jaston Havens, PT 03/27/2023, 11:45 AM

## 2023-03-30 NOTE — Therapy (Signed)
OUTPATIENT PHYSICAL THERAPY LOWER EXTREMITY TREATMENT   Patient Name: Lisa Everett MRN: [id number] DOB:[birth year], 57 y.o., female Today's Date: 03/31/2023  END OF SESSION:  PT End of Session - 03/31/23 1357     Visit Number 4    Number of Visits 14    Date for PT Re-Evaluation 05/14/23    Authorization Type [employer] -- 23 visit limit for the whole year    Authorization - Visit Number 12    Authorization - Number of Visits 23    PT Start Time 1358    PT Stop Time 1451    PT Time Calculation (min) 53 min    Activity Tolerance Patient tolerated treatment well    Behavior During Therapy WFL for tasks assessed/performed             Past Medical History:  Diagnosis Date   Allergy    pet dander   Arthritis    Asthma    Family history of adverse reaction to anesthesia    Sister has naseau and vomiting   Hot flashes    Hypertension    Hypokalemia    Vitamin D deficiency    Past Surgical History:  Procedure Laterality Date   CATARACT EXTRACTION Bilateral    CESAREAN SECTION     HERNIA REPAIR     x3 3rd with Dr. Sheliah Hatch 07-02-18   INCISIONAL HERNIA REPAIR N/A 07/02/2018   Procedure: HERNIA REPAIR INCISIONAL OPEN RECURRENT;  Surgeon: Kinsinger, De Blanch, MD;  Location: [facility];  Service: General;  Laterality: N/A;   INSERTION OF MESH N/A 07/02/2018   Procedure: INSERTION OF MESH;  Surgeon: Sheliah Hatch De Blanch, MD;  Location: [facility];  Service: General;  Laterality: N/A;   LAPAROTOMY N/A 07/03/2018   Procedure: EXPLORATORY LAPAROTOMY, RE-OPENING OF INCISION AND REMOVAL OF HEMATOMA;  Surgeon: Sheliah Hatch De Blanch, MD;  Location: [facility];  Service: General;  Laterality: N/A;   TOTAL KNEE ARTHROPLASTY Left 03/09/2023   Procedure: TOTAL KNEE ARTHROPLASTY;  Surgeon: Ollen Gross, MD;  Location: [facility];  Service: Orthopedics;  Laterality: Left;   WISDOM TOOTH EXTRACTION     Patient Active Problem List   Diagnosis Date Noted   OA (osteoarthritis) of knee 03/09/2023    Primary osteoarthritis of left knee 03/09/2023   Incisional hernia 07/02/2018    PCP: n/a REFERRING PROVIDER: Ollen Gross, MD REFERRING DIAG: M17.12 Unilateral primary osteoarthritis, left knee THERAPY DIAG:  Status post left knee replacement  Muscle weakness (generalized)  Localized edema  Other abnormalities of gait and mobility  Chronic pain of left knee  Chronic pain of right knee  Rationale for Evaluation and Treatment: Rehabilitation  ONSET DATE: 03/09/23 L TKA  SUBJECTIVE:  SUBJECTIVE STATEMENT: I think the swelling is going down. Pain is getting better.   PERTINENT HISTORY: Chronic knee OA  PAIN:  Are you having pain? Yes: NPRS scale: 3/10 Pain location: ant knee Pain description: Dull ache Aggravating factors: Cold weather, rain, being on my feet a lot, stairs Relieving factors: Heat/ice, pain medication  PRECAUTIONS: None  WEIGHT BEARING RESTRICTIONS: No  FALLS:  Has patient fallen in last 6 months? 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)  PATIENT GOALS: Improve pain for return to all normal function/work  NEXT MD VISIT: 03/24/23  OBJECTIVE:  (Measures in this section from initial  evaluation unless otherwise noted) PATIENT SURVEYS:  FOTO 35; predicted 59  EDEMA:  21" on L, 18" on R  LOWER EXTREMITY ROM: ROM Right 11/04/22 Left 11/04/22 Left Eval  Left 03/27/23 Left  03/31/23  Hip flexion       Hip extension       Hip abduction       Hip adduction       Hip internal rotation       Hip external rotation       Knee flexion 128 A/PROM 120 A/PROM 80 AROM  88 AAROM 100 A/ROM supine  Knee extension -10 AROM 0 PROM -34 AROM 0 PROM -25 AROM -15  AROM in LAQ -7 deg A/ROM supine  Ankle dorsiflexion       Ankle plantarflexion       Ankle  inversion       Ankle eversion        (Blank rows = not tested)  LOWER EXTREMITY MMT: MMT Right eval Left eval  Hip flexion 5 2  Hip extension  3-  Hip abduction  3  Hip adduction    Hip internal rotation    Hip external rotation    Knee flexion 3+ 3  Knee extension 4+ 2  Ankle dorsiflexion    Ankle plantarflexion    Ankle inversion    Ankle eversion     (Blank rows = not tested)  FUNCTIONAL TESTS:  5x STS TBA (limited due to pt pain)  GAIT: Distance walked: 50'  Assistive device utilized: Environmental consultant - 2 wheeled Level of assistance: CGA Comments: flexed trunk, wide BOS, decreased L LE weight shift/weight bearing, maintains knee in mostly extension, increased L lateral trunk lean  [facility] Adult PT Treatment:                                                DATE: 03/31/23 Therapeutic Exercise: Nustep L5 x 5 min minutes moving seat forward to increase bend Seated Gentle leg hangs with kicking for AAROM, use of R leg to assist with flexion AAROM knee flexion/extension with strap x 10 reps Supine Quad sets x 10 with toes toward head 5 sec hold SAQ x 10 5 sec hold SLR 2x5 cues for control on eccentric lowering HS PROM stretch with ankle on bolster (PT also demonstrated seated with foot on stool and prone knee hang) Heel slides x 10 reps with strap Bridge x 10 Gait: With glut set before walking x 10 5 sec hold Use of mirror for visual cueing x 20 ft X 80 feet with RW x 2 reps  Modalities: Vaso x 10 minutes L knee x 34 degrees med pressure  [facility] Adult PT Treatment:                                                DATE: 03/27/23 Therapeutic Exercise: Bike x 4 minutes rocking ant/posterior for AAROM knee Seated Gentle leg hangs with kicking for [employer] set with assist from PT engaging quad via tapping and end range assist to engage quad AAROM knee flexion/extension with pillowcase on floor x 12 reps Supine Heel slides x 10 reps L knee to chest with PT assisting to hold leg for  gravity assisted  flexion HS PROM stretch SLR with PT assist -- unable to lift SAQ with cues to maintain contact with bolster behind knee x 12 reps Gait: With glut set before walking Tactile cues for L hip initiation X 80 feet with RW x 2 reps Stairs x 4 with handrail on one side and step to pattern Modalities: Vaso x 10 minutes L knee x 34 degrees                                                                                                                    [facility] Adult PT Treatment:                                                DATE: 03/25/23 Therapeutic Exercise: Nustep L5 x 5 min for warm up and AAROM Stair negotiation 4'' steps with 1 rail on Lt with cues for sequencing Seated LAQ with tapping for quad activation 2 x 8 SAQ initially with tactile cues, progressed to AROM 2 x 10 Supine quad set - requires max tactile and verbal cues for mm contraction Supine heel slide with strap x 10 Supine clam red TB x 20 Modalities: Vaso x 10 min 34 degrees low pressure  PATIENT EDUCATION:  Education details: Exam findings, POC, initial HEP. Reiterated importance of performing HEP at home especially within the first few weeks Person educated: Patient Education method: Explanation, Demonstration, and Handouts Education comprehension: verbalized understanding, returned demonstration, and needs further education  HOME EXERCISE PROGRAM: Access Code: [email or web address] URL: [email or web address] Date: 03/31/2023 Prepared by: Raynelle Fanning  Exercises - Supine Heel Slide with Strap  - 1 x daily - 7 x weekly - 1 sets - 10 reps - Supine Quad Set  - 1 x daily - 7 x weekly - 2-3 sets - 10 reps - 3 sec hold - Supine Knee Extension Strengthening  - 1 x daily - 7 x weekly - 2 sets - 10 reps - Supine Gluteal Sets  - 1 x daily - 7 x weekly - 2 sets - 10 reps - Hooklying Isometric Clamshell  - 1 x daily - 7 x weekly - 2 sets - 10 reps - Supine Active Straight Leg Raise (Mirrored)  - 2 x daily - 7 x weekly  - 3 sets - 10 reps - Prone Knee Extension Hang  - 2 x daily - 7 x weekly - 1 sets - 3 reps - max hold - Seated Knee Extension Stretch with Chair  - 2 x daily - 7 x weekly - 1 sets - 1 reps - max hold  Patient Education - Going Up and Down Stairs With Two Rails After Surgery  ASSESSMENT:  CLINICAL IMPRESSION: Anyjah is demonstrated improved posture with gait today and significant gain in L knee ROM measuring 7-100 deg. She was able to perform SLR without assist also.  HEP progressed with SLR and  knee extension stretches. She continues to demonstrate potential for improvement and would benefit from continued skilled therapy to address impairments.    OBJECTIVE IMPAIRMENTS: Abnormal gait, decreased activity tolerance, decreased balance, decreased endurance, decreased mobility, difficulty walking, decreased ROM, decreased strength, impaired flexibility, and pain.   GOALS: Goals reviewed with patient? Yes  SHORT TERM GOALS: Target date: 04/16/2023  Patient will be independent with initial HEP. Baseline:  Goal status: INITIAL  2.  Pt will have improved L knee AROM from 10 to 100 deg Baseline:  Goal status: MET  LONG TERM GOALS: Target date: 05/14/2023  Patient will be independent with advanced/ongoing HEP to improve outcomes and carryover.  Baseline:  Goal status: INITIAL  2.  Patient will report at least 50% improvement in bilat knee pain to improve QOL. Baseline:  Goal status: INITIAL  3.  Patient will have improved L knee AROM from >/= 0 to 115 deg to safely ascend/descend stairs Baseline:  Goal status: INITIAL  4.  Patient will demonstrate improved functional LE strength as demonstrated by 5x STS </=13 sec without UE support. Baseline:  Goal status: INITIAL  5.  Patient will be able to ambulate 1000' with LRAD and normal gait pattern without increased pain to access community.  Baseline:  Goal status: INITIAL  6. Patient will have improved FOTO score of  >/=59 Baseline:  Goal status: INITIAL  PLAN:  PT FREQUENCY: 2x/week with plans to decrease to 1x/wk (she only has 23 visits for the whole year and she has already used 9 visits during last PT episode)  PT DURATION: 8 weeks  PLANNED INTERVENTIONS: Therapeutic exercises, Therapeutic activity, Neuromuscular re-education, Balance training, Gait training, Patient/Family education, Self Care, Joint mobilization, Stair training, Aquatic Therapy, Dry Needling, Electrical stimulation, Cryotherapy, Moist heat, Taping, Vasopneumatic device, Ionotophoresis 4mg /ml Dexamethasone, Manual therapy, and Re-evaluation  PLAN FOR NEXT SESSION: gait/stairs, Knee ROM/stretching.    Deveion Denz, PT 03/31/2023, 2:47 PM

## 2023-03-31 ENCOUNTER — Ambulatory Visit: Payer: 59 | Attending: Orthopedic Surgery | Admitting: Physical Therapy

## 2023-03-31 ENCOUNTER — Encounter: Payer: Self-pay | Admitting: Physical Therapy

## 2023-03-31 DIAGNOSIS — R2689 Other abnormalities of gait and mobility: Secondary | ICD-10-CM | POA: Diagnosis present

## 2023-03-31 DIAGNOSIS — G8929 Other chronic pain: Secondary | ICD-10-CM | POA: Insufficient documentation

## 2023-03-31 DIAGNOSIS — M25562 Pain in left knee: Secondary | ICD-10-CM | POA: Insufficient documentation

## 2023-03-31 DIAGNOSIS — M6281 Muscle weakness (generalized): Secondary | ICD-10-CM | POA: Insufficient documentation

## 2023-03-31 DIAGNOSIS — R600 Localized edema: Secondary | ICD-10-CM | POA: Diagnosis present

## 2023-03-31 DIAGNOSIS — Z96652 Presence of left artificial knee joint: Secondary | ICD-10-CM | POA: Insufficient documentation

## 2023-03-31 DIAGNOSIS — M25561 Pain in right knee: Secondary | ICD-10-CM | POA: Diagnosis present

## 2023-04-03 ENCOUNTER — Ambulatory Visit: Payer: 59

## 2023-04-03 DIAGNOSIS — R2689 Other abnormalities of gait and mobility: Secondary | ICD-10-CM

## 2023-04-03 DIAGNOSIS — R600 Localized edema: Secondary | ICD-10-CM

## 2023-04-03 DIAGNOSIS — G8929 Other chronic pain: Secondary | ICD-10-CM

## 2023-04-03 DIAGNOSIS — Z96652 Presence of left artificial knee joint: Secondary | ICD-10-CM

## 2023-04-03 DIAGNOSIS — M6281 Muscle weakness (generalized): Secondary | ICD-10-CM

## 2023-04-03 NOTE — Therapy (Signed)
OUTPATIENT PHYSICAL THERAPY LOWER EXTREMITY TREATMENT   Patient Name: Ece Hardwicke MRN: [id number] DOB:1966-03-27, 57 y.o., female Today's Date: 04/03/2023  END OF SESSION:  PT End of Session - 04/03/23 1017     Visit Number 5    Number of Visits 14    Date for PT Re-Evaluation 05/14/23    Authorization Type [employer] -- 23 visit limit for the whole year    Authorization - Visit Number 5    Authorization - Number of Visits 14    PT Start Time 1020    PT Stop Time 1108    PT Time Calculation (min) 48 min    Activity Tolerance Patient tolerated treatment well    Behavior During Therapy WFL for tasks assessed/performed             Past Medical History:  Diagnosis Date   Allergy    pet dander   Arthritis    Asthma    Family history of adverse reaction to anesthesia    Sister has naseau and vomiting   Hot flashes    Hypertension    Hypokalemia    Vitamin D deficiency    Past Surgical History:  Procedure Laterality Date   CATARACT EXTRACTION Bilateral    CESAREAN SECTION     HERNIA REPAIR     x3 3rd with Dr. Sheliah Hatch 07-02-18   INCISIONAL HERNIA REPAIR N/A 07/02/2018   Procedure: HERNIA REPAIR INCISIONAL OPEN RECURRENT;  Surgeon: Kinsinger, De Blanch, MD;  Location: [facility];  Service: General;  Laterality: N/A;   INSERTION OF MESH N/A 07/02/2018   Procedure: INSERTION OF MESH;  Surgeon: Sheliah Hatch De Blanch, MD;  Location: [facility];  Service: General;  Laterality: N/A;   LAPAROTOMY N/A 07/03/2018   Procedure: EXPLORATORY LAPAROTOMY, RE-OPENING OF INCISION AND REMOVAL OF HEMATOMA;  Surgeon: Sheliah Hatch De Blanch, MD;  Location: [facility];  Service: General;  Laterality: N/A;   TOTAL KNEE ARTHROPLASTY Left 03/09/2023   Procedure: TOTAL KNEE ARTHROPLASTY;  Surgeon: Ollen Gross, MD;  Location: [facility];  Service: Orthopedics;  Laterality: Left;   WISDOM TOOTH EXTRACTION     Patient Active Problem List   Diagnosis Date Noted   OA (osteoarthritis) of knee 03/09/2023    Primary osteoarthritis of left knee 03/09/2023   Incisional hernia 07/02/2018    PCP: n/a REFERRING PROVIDER: Ollen Gross, MD REFERRING DIAG: M17.12 Unilateral primary osteoarthritis, left knee THERAPY DIAG:  Status post left knee replacement  Muscle weakness (generalized)  Localized edema  Other abnormalities of gait and mobility  Chronic pain of right knee  Chronic pain of left knee  Rationale for Evaluation and Treatment: Rehabilitation  ONSET DATE: 03/09/23 L TKA  SUBJECTIVE:  SUBJECTIVE STATEMENT: Patient reports 3/10 pain in knee today, states she has more pain on lateral side of knee.  PERTINENT HISTORY: Chronic knee OA  PAIN:  Are you having pain? Yes: NPRS scale: 3/10 Pain location: ant knee Pain description: Dull ache Aggravating factors: Cold weather, rain, being on my feet a lot, stairs Relieving factors: Heat/ice, pain medication  PRECAUTIONS: None  WEIGHT BEARING RESTRICTIONS: No  FALLS:  Has patient fallen in last 6 months? 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)  PATIENT GOALS: Improve pain for return to all normal function/work  NEXT MD VISIT: 03/24/23  OBJECTIVE:  (Measures  in this section from initial evaluation unless otherwise noted) PATIENT SURVEYS:  FOTO 35; predicted 23  EDEMA:  21" on L, 18" on R  LOWER EXTREMITY ROM: ROM Right 11/04/22 Left 11/04/22 Left Eval  Left 03/27/23 Left  03/31/23  Hip flexion       Hip extension       Hip abduction       Hip adduction       Hip internal rotation       Hip external rotation       Knee flexion 128 A/PROM 120 A/PROM 80 AROM  88 AAROM 100 A/ROM supine  Knee extension -10 AROM 0 PROM -34 AROM 0 PROM -25 AROM -15  AROM in LAQ -7 deg A/ROM supine  Ankle dorsiflexion       Ankle  plantarflexion       Ankle inversion       Ankle eversion        (Blank rows = not tested)  LOWER EXTREMITY MMT: MMT Right eval Left eval  Hip flexion 5 2  Hip extension  3-  Hip abduction  3  Hip adduction    Hip internal rotation    Hip external rotation    Knee flexion 3+ 3  Knee extension 4+ 2  Ankle dorsiflexion    Ankle plantarflexion    Ankle inversion    Ankle eversion     (Blank rows = not tested)  FUNCTIONAL TESTS:  5x STS TBA (limited due to pt pain)  GAIT: Distance walked: 50'  Assistive device utilized: Environmental consultant - 2 wheeled Level of assistance: CGA Comments: flexed trunk, wide BOS, decreased L LE weight shift/weight bearing, maintains knee in mostly extension, increased L lateral trunk lean    [facility] Adult PT Treatment:                                                DATE: 04/03/2023 Therapeutic Exercise: Recumbent bike partial revolutions x AAROM heel slides 10x10" Quad set with small ball 10x5" SAQ green bolster 10x5"  S/L straight leg hip abd + glute set x10 S/L hip ext on diagonal leg raise (assist from therapist) L LAQ YTB 2x10 Therapeutic Activity: Walking with cueing for glute activation 6" foot taps with focus on dynamic knee flexion --> weight shifting for knee flexion stretch Modalities: Vaso x 10 minutes L knee x 34 degrees med pressure    [facility] Adult PT Treatment:                                                DATE: 03/31/23 Therapeutic Exercise: Nustep L5 x 5 min minutes moving seat forward to increase bend Seated Gentle leg hangs with kicking for AAROM, use of R leg to assist with flexion AAROM knee flexion/extension with strap x 10 reps Supine Quad sets x 10 with toes toward head 5 sec hold SAQ x 10 5 sec hold SLR 2x5 cues for control on eccentric lowering HS PROM stretch with ankle on bolster (PT also demonstrated seated with foot on stool and prone knee hang) Heel slides x 10 reps with strap Bridge x 10 Gait: With glut set  before walking x 10 5 sec hold Use of mirror for  visual cueing x 20 ft X 80 feet with RW x 2 reps  Modalities: Vaso x 10 minutes L knee x 34 degrees med pressure    [facility] Adult PT Treatment:                                                DATE: 03/27/23 Therapeutic Exercise: Bike x 4 minutes rocking ant/posterior for AAROM knee Seated Gentle leg hangs with kicking for [employer] set with assist from PT engaging quad via tapping and end range assist to engage quad AAROM knee flexion/extension with pillowcase on floor x 12 reps Supine Heel slides x 10 reps L knee to chest with PT assisting to hold leg for gravity assisted flexion HS PROM stretch SLR with PT assist -- unable to lift SAQ with cues to maintain contact with bolster behind knee x 12 reps Gait: With glut set before walking Tactile cues for L hip initiation X 80 feet with RW x 2 reps Stairs x 4 with handrail on one side and step to pattern Modalities: Vaso x 10 minutes L knee x 34 degrees                                                                                                                     PATIENT EDUCATION:  Education details: Exam findings, POC, initial HEP. Reiterated importance of performing HEP at home especially within the first few weeks Person educated: Patient Education method: Explanation, Demonstration, and Handouts Education comprehension: verbalized understanding, returned demonstration, and needs further education  HOME EXERCISE PROGRAM: Access Code: [email or web address] URL: [email or web address] Date: 03/31/2023 Prepared by: Raynelle Fanning  Exercises - Supine Heel Slide with Strap  - 1 x daily - 7 x weekly - 1 sets - 10 reps - Supine Quad Set  - 1 x daily - 7 x weekly - 2-3 sets - 10 reps - 3 sec hold - Supine Knee Extension Strengthening  - 1 x daily - 7 x weekly - 2 sets - 10 reps - Supine Gluteal Sets  - 1 x daily - 7 x weekly - 2 sets - 10 reps - Hooklying Isometric Clamshell  - 1 x daily  - 7 x weekly - 2 sets - 10 reps - Supine Active Straight Leg Raise (Mirrored)  - 2 x daily - 7 x weekly - 3 sets - 10 reps - Prone Knee Extension Hang  - 2 x daily - 7 x weekly - 1 sets - 3 reps - max hold - Seated Knee Extension Stretch with Chair  - 2 x daily - 7 x weekly - 1 sets - 1 reps - max hold  Patient Education - Going Up and Down Stairs With Two Rails After Surgery  ASSESSMENT:  CLINICAL IMPRESSION: Gait training continued with focus on functional knee flexion and glute activation in L single leg stance for improved stability.  OBJECTIVE IMPAIRMENTS: Abnormal gait, decreased activity tolerance, decreased balance, decreased endurance, decreased mobility, difficulty walking, decreased ROM, decreased strength, impaired flexibility, and pain.   GOALS: Goals reviewed with patient? Yes  SHORT TERM GOALS: Target date: 04/16/2023  Patient will be independent with initial HEP. Baseline:  Goal status: INITIAL  2.  Pt will have improved L knee AROM from 10 to 100 deg Baseline:  Goal status: MET  LONG TERM GOALS: Target date: 05/14/2023  Patient will be independent with advanced/ongoing HEP to improve outcomes and carryover.  Baseline:  Goal status: INITIAL  2.  Patient will report at least 50% improvement in bilat knee pain to improve QOL. Baseline:  Goal status: INITIAL  3.  Patient will have improved L knee AROM from >/= 0 to 115 deg to safely ascend/descend stairs Baseline:  Goal status: INITIAL  4.  Patient will demonstrate improved functional LE strength as demonstrated by 5x STS </=13 sec without UE support. Baseline:  Goal status: INITIAL  5.  Patient will be able to ambulate 1000' with LRAD and normal gait pattern without increased pain to access community.  Baseline:  Goal status: INITIAL  6. Patient will have improved FOTO score of >/=59 Baseline:  Goal status: INITIAL  PLAN:  PT FREQUENCY: 2x/week with plans to decrease to 1x/wk (she only has 23  visits for the whole year and she has already used 9 visits during last PT episode)  PT DURATION: 8 weeks  PLANNED INTERVENTIONS: Therapeutic exercises, Therapeutic activity, Neuromuscular re-education, Balance training, Gait training, Patient/Family education, Self Care, Joint mobilization, Stair training, Aquatic Therapy, Dry Needling, Electrical stimulation, Cryotherapy, Moist heat, Taping, Vasopneumatic device, Ionotophoresis 4mg /ml Dexamethasone, Manual therapy, and Re-evaluation  PLAN FOR NEXT SESSION: gait/stairs, Knee ROM/stretching.    Sanjuana Mae, PTA 04/03/2023, 11:02 AM

## 2023-04-06 NOTE — Therapy (Signed)
OUTPATIENT PHYSICAL THERAPY LOWER EXTREMITY TREATMENT   Patient Name: Camika Apicella MRN: [id number] DOB:Jul 06, 1966, 57 y.o., female Today's Date: 04/07/2023  END OF SESSION:  PT End of Session - 04/07/23 1145     Visit Number 6    Number of Visits 14    Date for PT Re-Evaluation 05/14/23    Authorization Type [employer] -- 23 visit limit for the whole year    Authorization - Visit Number 6    Authorization - Number of Visits 14    PT Start Time 1145    PT Stop Time 1239    PT Time Calculation (min) 54 min    Activity Tolerance Patient tolerated treatment well    Behavior During Therapy WFL for tasks assessed/performed              Past Medical History:  Diagnosis Date   Allergy    pet dander   Arthritis    Asthma    Family history of adverse reaction to anesthesia    Sister has naseau and vomiting   Hot flashes    Hypertension    Hypokalemia    Vitamin D deficiency    Past Surgical History:  Procedure Laterality Date   CATARACT EXTRACTION Bilateral    CESAREAN SECTION     HERNIA REPAIR     x3 3rd with Dr. Sheliah Hatch 07-02-18   INCISIONAL HERNIA REPAIR N/A 07/02/2018   Procedure: HERNIA REPAIR INCISIONAL OPEN RECURRENT;  Surgeon: Kinsinger, De Blanch, MD;  Location: [facility];  Service: General;  Laterality: N/A;   INSERTION OF MESH N/A 07/02/2018   Procedure: INSERTION OF MESH;  Surgeon: Sheliah Hatch De Blanch, MD;  Location: [facility];  Service: General;  Laterality: N/A;   LAPAROTOMY N/A 07/03/2018   Procedure: EXPLORATORY LAPAROTOMY, RE-OPENING OF INCISION AND REMOVAL OF HEMATOMA;  Surgeon: Sheliah Hatch De Blanch, MD;  Location: [facility];  Service: General;  Laterality: N/A;   TOTAL KNEE ARTHROPLASTY Left 03/09/2023   Procedure: TOTAL KNEE ARTHROPLASTY;  Surgeon: Ollen Gross, MD;  Location: [facility];  Service: Orthopedics;  Laterality: Left;   WISDOM TOOTH EXTRACTION     Patient Active Problem List   Diagnosis Date Noted   OA (osteoarthritis) of knee 03/09/2023    Primary osteoarthritis of left knee 03/09/2023   Incisional hernia 07/02/2018    PCP: n/a REFERRING PROVIDER: Ollen Gross, MD REFERRING DIAG: M17.12 Unilateral primary osteoarthritis, left knee THERAPY DIAG:  Status post left knee replacement  Muscle weakness (generalized)  Localized edema  Other abnormalities of gait and mobility  Chronic pain of right knee  Rationale for Evaluation and Treatment: Rehabilitation  ONSET DATE: 03/09/23 L TKA  SUBJECTIVE:  SUBJECTIVE STATEMENT: I just started using a crutch today so hopefully I won't lean forward as much.  PERTINENT HISTORY: Chronic knee OA  PAIN:  Are you having pain? Yes: NPRS scale: 2/10 Pain location: ant knee Pain description: Dull ache Aggravating factors: Cold weather, rain, being on my feet a lot, stairs Relieving factors: Heat/ice, pain medication  PRECAUTIONS: None  WEIGHT BEARING RESTRICTIONS: No  FALLS:  Has patient fallen in last 6 months? 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)  PATIENT GOALS: Improve pain for return to all normal function/work  NEXT MD VISIT: 03/24/23  OBJECTIVE:  (Measures in this section from initial evaluation unless  otherwise noted) PATIENT SURVEYS:  FOTO 35; predicted 79  EDEMA:  21" on L, 18" on R  LOWER EXTREMITY ROM: ROM Right 11/04/22 Left 11/04/22 Left Eval  Left 03/27/23 Left  03/31/23 Left 04/07/23  Hip flexion        Hip extension        Hip abduction        Hip adduction        Hip internal rotation        Hip external rotation        Knee flexion 128 A/PROM 120 A/PROM 80 AROM  88 AAROM 100 A/ROM supine 106 A/ROM  Knee extension -10 AROM 0 PROM -34 AROM 0 PROM -25 AROM -15  AROM in LAQ -7 deg A/ROM supine -7 A/ROM  Ankle dorsiflexion        Ankle  plantarflexion        Ankle inversion        Ankle eversion         (Blank rows = not tested)  LOWER EXTREMITY MMT: MMT Right eval Left eval Left 04/07/23  Hip flexion 5 2 4   Hip extension  3-   Hip abduction  3   Hip adduction     Hip internal rotation     Hip external rotation     Knee flexion 3+ 3 4+  Knee extension 4+ 2 5-  Ankle dorsiflexion     Ankle plantarflexion     Ankle inversion     Ankle eversion      (Blank rows = not tested)  FUNCTIONAL TESTS:  5x STS 18 sec 04/07/23  GAIT: Distance walked: 50'  Assistive device utilized: Environmental consultant - 2 wheeled Level of assistance: CGA Comments: flexed trunk, wide BOS, decreased L LE weight shift/weight bearing, maintains knee in mostly extension, increased L lateral trunk lean   [facility] Adult PT Treatment:                                                DATE: 04/07/2023 Therapeutic Exercise: Recumbent bike partial revolutions x cues for full range bacward and forward AAROM heel slides 5, 5x10" in flexed position SAQ green bolster x 20 S/L straight leg hip abd + glute set x10 SLR 2x10 S/L hip ABD 2x20 Prone knee hang x 1 min with 1.5# wt on ankle Gait: With single crutch x 20 ft (raised crutch so handle is cane height to discourage trunk lean) With SPC working on taking full step with R LE increasing left stance time 50 ft with SPC Modalities: Vaso x 10 minutes L knee x 34 degrees med pressure  [facility] Adult PT Treatment:                                                DATE: 04/03/2023 Therapeutic Exercise: Recumbent bike partial revolutions x AAROM heel slides 10x10" Quad set with small ball 10x5" SAQ green bolster 10x5"  S/L straight leg hip abd + glute set x10 S/L hip ext on diagonal leg raise (assist from therapist) L LAQ YTB 2x10 Therapeutic Activity: Walking with cueing for glute activation 6" foot taps with focus on dynamic knee flexion --> weight shifting for knee flexion stretch Modalities: Vaso  x 10  minutes L knee x 34 degrees med pressure   [facility] Adult PT Treatment:                                                DATE: 03/31/23 Therapeutic Exercise: Nustep L5 x 5 min minutes moving seat forward to increase bend Seated Gentle leg hangs with kicking for AAROM, use of R leg to assist with flexion AAROM knee flexion/extension with strap x 10 reps Supine Quad sets x 10 with toes toward head 5 sec hold SAQ x 10 5 sec hold SLR 2x5 cues for control on eccentric lowering HS PROM stretch with ankle on bolster (PT also demonstrated seated with foot on stool and prone knee hang) Heel slides x 10 reps with strap Bridge x 10 Gait: With glut set before walking x 10 5 sec hold Use of mirror for visual cueing x 20 ft X 80 feet with RW x 2 reps  Modalities: Vaso x 10 minutes L knee x 34 degrees med pressure                                    PATIENT EDUCATION:  Education details: Exam findings, POC, initial HEP. Reiterated importance of performing HEP at home especially within the first few weeks Person educated: Patient Education method: Explanation, Demonstration, and Handouts Education comprehension: verbalized understanding, returned demonstration, and needs further education  HOME EXERCISE PROGRAM: Access Code: [email or web address] URL: [email or web address] Date: 03/31/2023 Prepared by: Raynelle Fanning  Exercises - Supine Heel Slide with Strap  - 1 x daily - 7 x weekly - 1 sets - 10 reps - Supine Quad Set  - 1 x daily - 7 x weekly - 2-3 sets - 10 reps - 3 sec hold - Supine Knee Extension Strengthening  - 1 x daily - 7 x weekly - 2 sets - 10 reps - Supine Gluteal Sets  - 1 x daily - 7 x weekly - 2 sets - 10 reps - Hooklying Isometric Clamshell  - 1 x daily - 7 x weekly - 2 sets - 10 reps - Supine Active Straight Leg Raise (Mirrored)  - 2 x daily - 7 x weekly - 3 sets - 10 reps - Prone Knee Extension Hang  - 2 x daily - 7 x weekly - 1 sets - 3 reps - max hold - Seated Knee Extension Stretch  with Chair  - 2 x daily - 7 x weekly - 1 sets - 1 reps - max hold  Patient Education - Going Up and Down Stairs With Two Rails After Surgery  ASSESSMENT:  CLINICAL IMPRESSION: Nazly is progressing with L knee flexion, but extension is still limited by 7 deg. She was able to complete 5xSTS in 18 seconds today. She presents with single crutch today so we worked on gait with SPC and crutch. She does not have a cane at home but PT encouraged taking full steps with counter support at home to help normalize gait. LE strength has improved as well. Yasmine continues to demonstrate potential for improvement and would benefit from continued skilled therapy to address impairments.    OBJECTIVE IMPAIRMENTS: Abnormal gait, decreased activity tolerance, decreased balance, decreased endurance, decreased mobility, difficulty walking, decreased ROM, decreased strength, impaired flexibility, and pain.   GOALS:  Goals reviewed with patient? Yes  SHORT TERM GOALS: Target date: 04/16/2023  Patient will be independent with initial HEP. Baseline:  Goal status: INITIAL  2.  Pt will have improved L knee AROM from 10 to 100 deg Baseline:  Goal status: MET  LONG TERM GOALS: Target date: 05/14/2023  Patient will be independent with advanced/ongoing HEP to improve outcomes and carryover.  Baseline:  Goal status: INITIAL  2.  Patient will report at least 50% improvement in bilat knee pain to improve QOL. Baseline:  Goal status: INITIAL  3.  Patient will have improved L knee AROM from >/= 0 to 115 deg to safely ascend/descend stairs Baseline:  Goal status: INITIAL  4.  Patient will demonstrate improved functional LE strength as demonstrated by 5x STS </=13 sec without UE support. Baseline:  Goal status: INITIAL  5.  Patient will be able to ambulate 1000' with LRAD and normal gait pattern without increased pain to access community.  Baseline:  Goal status: INITIAL  6. Patient will have improved FOTO  score of >/=59 Baseline:  Goal status: INITIAL  PLAN:  PT FREQUENCY: 2x/week with plans to decrease to 1x/wk (she only has 23 visits for the whole year and she has already used 9 visits during last PT episode)  PT DURATION: 8 weeks  PLANNED INTERVENTIONS: Therapeutic exercises, Therapeutic activity, Neuromuscular re-education, Balance training, Gait training, Patient/Family education, Self Care, Joint mobilization, Stair training, Aquatic Therapy, Dry Needling, Electrical stimulation, Cryotherapy, Moist heat, Taping, Vasopneumatic device, Ionotophoresis 4mg /ml Dexamethasone, Manual therapy, and Re-evaluation  PLAN FOR NEXT SESSION: focus on EXTENSION, gait/stairs, Knee ROM/stretching.   Solon Palm, PT  04/07/2023, 12:36 PM

## 2023-04-07 ENCOUNTER — Encounter: Payer: Self-pay | Admitting: Physical Therapy

## 2023-04-07 ENCOUNTER — Ambulatory Visit: Payer: 59 | Admitting: Physical Therapy

## 2023-04-07 DIAGNOSIS — Z96652 Presence of left artificial knee joint: Secondary | ICD-10-CM | POA: Diagnosis not present

## 2023-04-07 DIAGNOSIS — M6281 Muscle weakness (generalized): Secondary | ICD-10-CM

## 2023-04-07 DIAGNOSIS — R600 Localized edema: Secondary | ICD-10-CM

## 2023-04-07 DIAGNOSIS — R2689 Other abnormalities of gait and mobility: Secondary | ICD-10-CM

## 2023-04-07 DIAGNOSIS — G8929 Other chronic pain: Secondary | ICD-10-CM

## 2023-04-09 ENCOUNTER — Ambulatory Visit: Payer: 59 | Admitting: Physical Therapy

## 2023-04-14 ENCOUNTER — Ambulatory Visit: Payer: 59 | Admitting: Physical Therapy

## 2023-04-16 ENCOUNTER — Ambulatory Visit: Payer: 59 | Admitting: Physical Therapy

## 2023-04-16 ENCOUNTER — Encounter: Payer: Self-pay | Admitting: Physical Therapy

## 2023-04-16 DIAGNOSIS — R2689 Other abnormalities of gait and mobility: Secondary | ICD-10-CM

## 2023-04-16 DIAGNOSIS — R600 Localized edema: Secondary | ICD-10-CM

## 2023-04-16 DIAGNOSIS — G8929 Other chronic pain: Secondary | ICD-10-CM

## 2023-04-16 DIAGNOSIS — Z96652 Presence of left artificial knee joint: Secondary | ICD-10-CM | POA: Diagnosis not present

## 2023-04-16 DIAGNOSIS — M6281 Muscle weakness (generalized): Secondary | ICD-10-CM

## 2023-04-16 NOTE — Therapy (Signed)
OUTPATIENT PHYSICAL THERAPY LOWER EXTREMITY TREATMENT   Patient Name: Jenevy Aherne MRN: [id number] DOB:October 22, 1966, 57 y.o., female Today's Date: 04/16/2023  END OF SESSION:  PT End of Session - 04/16/23 1138     Visit Number 7    Number of Visits 14    Date for PT Re-Evaluation 05/14/23    Authorization Type [employer] -- 23 visit limit for the whole year    Authorization - Visit Number 7    Authorization - Number of Visits 14    PT Start Time 1140    PT Stop Time 1220    PT Time Calculation (min) 40 min    Activity Tolerance Patient tolerated treatment well    Behavior During Therapy WFL for tasks assessed/performed             Past Medical History:  Diagnosis Date   Allergy    pet dander   Arthritis    Asthma    Family history of adverse reaction to anesthesia    Sister has naseau and vomiting   Hot flashes    Hypertension    Hypokalemia    Vitamin D deficiency    Past Surgical History:  Procedure Laterality Date   CATARACT EXTRACTION Bilateral    CESAREAN SECTION     HERNIA REPAIR     x3 3rd with Dr. Sheliah Hatch 07-02-18   INCISIONAL HERNIA REPAIR N/A 07/02/2018   Procedure: HERNIA REPAIR INCISIONAL OPEN RECURRENT;  Surgeon: Kinsinger, De Blanch, MD;  Location: [facility];  Service: General;  Laterality: N/A;   INSERTION OF MESH N/A 07/02/2018   Procedure: INSERTION OF MESH;  Surgeon: Sheliah Hatch De Blanch, MD;  Location: [facility];  Service: General;  Laterality: N/A;   LAPAROTOMY N/A 07/03/2018   Procedure: EXPLORATORY LAPAROTOMY, RE-OPENING OF INCISION AND REMOVAL OF HEMATOMA;  Surgeon: Sheliah Hatch De Blanch, MD;  Location: [facility];  Service: General;  Laterality: N/A;   TOTAL KNEE ARTHROPLASTY Left 03/09/2023   Procedure: TOTAL KNEE ARTHROPLASTY;  Surgeon: Ollen Gross, MD;  Location: [facility];  Service: Orthopedics;  Laterality: Left;   WISDOM TOOTH EXTRACTION     Patient Active Problem List   Diagnosis Date Noted   OA (osteoarthritis) of knee 03/09/2023    Primary osteoarthritis of left knee 03/09/2023   Incisional hernia 07/02/2018    PCP: n/a REFERRING PROVIDER: Ollen Gross, MD REFERRING DIAG: M17.12 Unilateral primary osteoarthritis, left knee THERAPY DIAG:  Status post left knee replacement  Muscle weakness (generalized)  Localized edema  Other abnormalities of gait and mobility  Chronic pain of right knee  Chronic pain of left knee  Rationale for Evaluation and Treatment: Rehabilitation  ONSET DATE: 03/09/23 L TKA  SUBJECTIVE:  SUBJECTIVE STATEMENT: Pt saw Dr. Lequita Halt on Tuesday and he was pleased with her progress. Imaging was good -- knee is in good alignment. Pt notes some increased soreness this morning.   PERTINENT HISTORY: Chronic knee OA  PAIN:  Are you having pain? Yes: NPRS scale: 4/10 Pain location: ant knee Pain description: Dull ache Aggravating factors: Cold weather, rain, being on my feet a lot, stairs Relieving factors: Heat/ice, pain medication  PRECAUTIONS: None  WEIGHT BEARING RESTRICTIONS: No  FALLS:  Has patient fallen in last 6 months? 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)  PATIENT GOALS: Improve pain for return  to all normal function/work  NEXT MD VISIT: Next 5 weeks  OBJECTIVE:  (Measures in this section from initial evaluation unless otherwise noted) PATIENT SURVEYS:  FOTO 35; predicted 59  EDEMA:  21" on L, 18" on R  LOWER EXTREMITY ROM: ROM Right 11/04/22 Left 11/04/22 Left Eval  Left 03/27/23 Left  03/31/23 Left 04/07/23  Hip flexion        Hip extension        Hip abduction        Hip adduction        Hip internal rotation        Hip external rotation        Knee flexion 128 A/PROM 120 A/PROM 80 AROM  88 AAROM 100 A/ROM supine 106 A/ROM  Knee extension -10 AROM 0  PROM -34 AROM 0 PROM -25 AROM -15  AROM in LAQ -7 deg A/ROM supine -7 A/ROM  Ankle dorsiflexion        Ankle plantarflexion        Ankle inversion        Ankle eversion         (Blank rows = not tested)  LOWER EXTREMITY MMT: MMT Right eval Left eval Left 04/07/23  Hip flexion 5 2 4   Hip extension  3-   Hip abduction  3   Hip adduction     Hip internal rotation     Hip external rotation     Knee flexion 3+ 3 4+  Knee extension 4+ 2 5-  Ankle dorsiflexion     Ankle plantarflexion     Ankle inversion     Ankle eversion      (Blank rows = not tested)  FUNCTIONAL TESTS:  5x STS 18 sec 04/07/23  GAIT: Distance walked: 50'  Assistive device utilized: Environmental consultant - 2 wheeled Level of assistance: CGA Comments: flexed trunk, wide BOS, decreased L LE weight shift/weight bearing, maintains knee in mostly extension, increased L lateral trunk lean  [facility] Adult PT Treatment:                                                DATE: 04/16/23 Therapeutic Exercise: Recumbent bike full revolutions x Leg press DL 16# [id number], SL 04# V40 Prone knee hang x1 min Prone knee hang + quad set 2x10 Modified deadlift with 5# KB on mat table 3x10 Neuromuscular re-ed: Step tap 4" step 2x10 Gait: 2x40' single crutch, cues for toe off with knee flexion on L and increased L stance phase Modalities: Vaso x 10 minutes L knee x 34 degrees med pressure   [facility] Adult PT Treatment:                                                DATE: 04/07/2023 Therapeutic Exercise: Recumbent bike partial revolutions x cues for full range bacward and forward AAROM heel slides 5, 5x10" in flexed position SAQ green bolster x 20 S/L straight leg hip abd + glute set x10 SLR 2x10 S/L hip ABD 2x20 Prone knee hang x 1 min with 1.5# wt on ankle Gait: With single crutch x 20 ft (raised crutch so handle is cane height to discourage trunk lean) With SPC working on taking full  step with R LE increasing left stance time 50 ft  with SPC Modalities: Vaso x 10 minutes L knee x 34 degrees med pressure  [facility] Adult PT Treatment:                                                DATE: 04/03/2023 Therapeutic Exercise: Recumbent bike partial revolutions x AAROM heel slides 10x10" Quad set with small ball 10x5" SAQ green bolster 10x5"  S/L straight leg hip abd + glute set x10 S/L hip ext on diagonal leg raise (assist from therapist) L LAQ YTB 2x10 Therapeutic Activity: Walking with cueing for glute activation 6" foot taps with focus on dynamic knee flexion --> weight shifting for knee flexion stretch Modalities: Vaso x 10 minutes L knee x 34 degrees med pressure                                    PATIENT EDUCATION:  Education details: Exam findings, POC, initial HEP. Reiterated importance of performing HEP at home especially within the first few weeks Person educated: Patient Education method: Explanation, Demonstration, and Handouts Education comprehension: verbalized understanding, returned demonstration, and needs further education  HOME EXERCISE PROGRAM: Access Code: [email or web address] URL: [email or web address] Date: 03/31/2023 Prepared by: Raynelle Fanning  Exercises - Supine Heel Slide with Strap  - 1 x daily - 7 x weekly - 1 sets - 10 reps - Supine Quad Set  - 1 x daily - 7 x weekly - 2-3 sets - 10 reps - 3 sec hold - Supine Knee Extension Strengthening  - 1 x daily - 7 x weekly - 2 sets - 10 reps - Supine Gluteal Sets  - 1 x daily - 7 x weekly - 2 sets - 10 reps - Hooklying Isometric Clamshell  - 1 x daily - 7 x weekly - 2 sets - 10 reps - Supine Active Straight Leg Raise (Mirrored)  - 2 x daily - 7 x weekly - 3 sets - 10 reps - Prone Knee Extension Hang  - 2 x daily - 7 x weekly - 1 sets - 3 reps - max hold - Seated Knee Extension Stretch with Chair  - 2 x daily - 7 x weekly - 1 sets - 1 reps - max hold  Patient Education - Going Up and Down Stairs With Two Rails After Surgery  ASSESSMENT:  CLINICAL  IMPRESSION: Pt able to perform full revolutions on bike today. Continued to work on knee ROM (especially terminal knee extension). Focused on weight shifting and stabilizing on L LE today. Continued work on Animator as pt tends to flex at trunk and demo increased R LE weight shift using crutches.    OBJECTIVE IMPAIRMENTS: Abnormal gait, decreased activity tolerance, decreased balance, decreased endurance, decreased mobility, difficulty walking, decreased ROM, decreased strength, impaired flexibility, and pain.   GOALS: Goals reviewed with patient? Yes  SHORT TERM GOALS: Target date: 04/16/2023  Patient will be independent with initial HEP. Baseline:  Goal status: MET  2.  Pt will have improved L knee AROM from 10 to 100 deg Baseline:  Goal status: MET  LONG TERM GOALS: Target date: 05/14/2023  Patient will be independent with advanced/ongoing HEP to improve outcomes and carryover.  Baseline:  Goal status: INITIAL  2.  Patient  will report at least 50% improvement in bilat knee pain to improve QOL. Baseline:  Goal status: INITIAL  3.  Patient will have improved L knee AROM from >/= 0 to 115 deg to safely ascend/descend stairs Baseline:  Goal status: INITIAL  4.  Patient will demonstrate improved functional LE strength as demonstrated by 5x STS </=13 sec without UE support. Baseline:  Goal status: INITIAL  5.  Patient will be able to ambulate 1000' with LRAD and normal gait pattern without increased pain to access community.  Baseline:  Goal status: INITIAL  6. Patient will have improved FOTO score of >/=59 Baseline:  Goal status: INITIAL  PLAN:  PT FREQUENCY: 2x/week with plans to decrease to 1x/wk (she only has 23 visits for the whole year and she has already used 9 visits during last PT episode)  PT DURATION: 8 weeks  PLANNED INTERVENTIONS: Therapeutic exercises, Therapeutic activity, Neuromuscular re-education, Balance training, Gait training, Patient/Family  education, Self Care, Joint mobilization, Stair training, Aquatic Therapy, Dry Needling, Electrical stimulation, Cryotherapy, Moist heat, Taping, Vasopneumatic device, Ionotophoresis 4mg /ml Dexamethasone, Manual therapy, and Re-evaluation  PLAN FOR NEXT SESSION: focus on EXTENSION, gait/stairs, Knee ROM/stretching.   Genevie Elman April Ma L Jean Skow, PT 04/16/2023, 11:38 AM

## 2023-04-21 ENCOUNTER — Encounter: Payer: Self-pay | Admitting: Physical Therapy

## 2023-04-21 ENCOUNTER — Ambulatory Visit: Payer: 59 | Admitting: Physical Therapy

## 2023-04-21 DIAGNOSIS — R600 Localized edema: Secondary | ICD-10-CM

## 2023-04-21 DIAGNOSIS — Z96652 Presence of left artificial knee joint: Secondary | ICD-10-CM

## 2023-04-21 DIAGNOSIS — R2689 Other abnormalities of gait and mobility: Secondary | ICD-10-CM

## 2023-04-21 DIAGNOSIS — M6281 Muscle weakness (generalized): Secondary | ICD-10-CM

## 2023-04-21 NOTE — Therapy (Signed)
OUTPATIENT PHYSICAL THERAPY LOWER EXTREMITY TREATMENT   Patient Name: Lisa Everett MRN: [id number] DOB:[birth year], 57 y.o., female Today's Date: 04/21/2023  END OF SESSION:  PT End of Session - 04/21/23 1217     Visit Number 8    Number of Visits 14    Date for PT Re-Evaluation 05/14/23    Authorization Type [employer] -- 23 visit limit for the whole year    Authorization - Visit Number 8    Authorization - Number of Visits 14    PT Start Time 1140    PT Stop Time 1225    PT Time Calculation (min) 45 min    Activity Tolerance Patient tolerated treatment well    Behavior During Therapy WFL for tasks assessed/performed              Past Medical History:  Diagnosis Date   Allergy    pet dander   Arthritis    Asthma    Family history of adverse reaction to anesthesia    Sister has naseau and vomiting   Hot flashes    Hypertension    Hypokalemia    Vitamin D deficiency    Past Surgical History:  Procedure Laterality Date   CATARACT EXTRACTION Bilateral    CESAREAN SECTION     HERNIA REPAIR     x3 3rd with Dr. Sheliah Hatch 07-02-18   INCISIONAL HERNIA REPAIR N/A 07/02/2018   Procedure: HERNIA REPAIR INCISIONAL OPEN RECURRENT;  Surgeon: Kinsinger, De Blanch, MD;  Location: [facility];  Service: General;  Laterality: N/A;   INSERTION OF MESH N/A 07/02/2018   Procedure: INSERTION OF MESH;  Surgeon: Sheliah Hatch De Blanch, MD;  Location: [facility];  Service: General;  Laterality: N/A;   LAPAROTOMY N/A 07/03/2018   Procedure: EXPLORATORY LAPAROTOMY, RE-OPENING OF INCISION AND REMOVAL OF HEMATOMA;  Surgeon: Sheliah Hatch De Blanch, MD;  Location: [facility];  Service: General;  Laterality: N/A;   TOTAL KNEE ARTHROPLASTY Left 03/09/2023   Procedure: TOTAL KNEE ARTHROPLASTY;  Surgeon: Ollen Gross, MD;  Location: [facility];  Service: Orthopedics;  Laterality: Left;   WISDOM TOOTH EXTRACTION     Patient Active Problem List   Diagnosis Date Noted   OA (osteoarthritis) of knee 03/09/2023    Primary osteoarthritis of left knee 03/09/2023   Incisional hernia 07/02/2018    PCP: n/a REFERRING PROVIDER: Ollen Gross, MD REFERRING DIAG: M17.12 Unilateral primary osteoarthritis, left knee THERAPY DIAG:  Status post left knee replacement  Muscle weakness (generalized)  Localized edema  Other abnormalities of gait and mobility  Rationale for Evaluation and Treatment: Rehabilitation  ONSET DATE: 03/09/23 L TKA  SUBJECTIVE:  SUBJECTIVE STATEMENT: Pt c/o stiffness but not as much pain  PERTINENT HISTORY: Chronic knee OA  PAIN:  Are you having pain? Yes: NPRS scale: 4/10 Pain location: ant knee Pain description: Dull ache Aggravating factors: Cold weather, rain, being on my feet a lot, stairs Relieving factors: Heat/ice, pain medication  PRECAUTIONS: None  WEIGHT BEARING RESTRICTIONS: No  FALLS:  Has patient fallen in last 6 months? 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)  PATIENT GOALS: Improve pain for return to all normal function/work  NEXT MD VISIT: Next 5 weeks  OBJECTIVE:  (Measures in this section from initial evaluation unless otherwise noted) PATIENT SURVEYS:  FOTO 35; predicted 59  EDEMA:  21" on L, 18" on R  LOWER EXTREMITY ROM: ROM Right 11/04/22 Left 11/04/22 Left Eval  Left 03/27/23 Left  03/31/23 Left 04/07/23  Hip flexion        Hip extension        Hip abduction        Hip adduction        Hip internal rotation        Hip external rotation        Knee flexion 128 A/PROM 120 A/PROM 80 AROM  88 AAROM 100 A/ROM supine 106 A/ROM  Knee extension -10 AROM 0 PROM -34 AROM 0 PROM -25 AROM -15  AROM in LAQ -7 deg A/ROM supine -7 A/ROM  Ankle dorsiflexion        Ankle plantarflexion        Ankle inversion        Ankle eversion          (Blank rows = not tested)  LOWER EXTREMITY MMT: MMT Right eval Left eval Left 04/07/23  Hip flexion 5 2 4   Hip extension  3-   Hip abduction  3   Hip adduction     Hip internal rotation     Hip external rotation     Knee flexion 3+ 3 4+  Knee extension 4+ 2 5-  Ankle dorsiflexion     Ankle plantarflexion     Ankle inversion     Ankle eversion      (Blank rows = not tested)  FUNCTIONAL TESTS:  5x STS 18 sec 04/07/23  GAIT: Distance walked: 50'  Assistive device utilized: Environmental consultant - 2 wheeled Level of assistance: CGA Comments: flexed trunk, wide BOS, decreased L LE weight shift/weight bearing, maintains knee in mostly extension, increased L lateral trunk lean  [facility] Adult PT Treatment:                                                DATE: 04/21/23 Therapeutic Exercise: Recumbent bike x 5 min full revolutions Leg press DL 96# 2 x 10, SL 04# x 10 TKE ball on wall x 15 SLR 2x10 SAQ x 20 Sidelying hip abd x 20 Prone HS curl --> quad set 2 x 10   Modalities: Vaso x 10 min 34 degrees med pressure  [facility] Adult PT Treatment:                                                DATE: 04/16/23 Therapeutic Exercise: Recumbent bike full revolutions x Leg press DL 54# [id number], SL 19# J47 Prone knee hang x1 min Prone knee hang + quad set 2x10 Modified deadlift with 5# KB on mat table 3x10 Neuromuscular re-ed: Step tap 4" step 2x10 Gait: 2x40' single crutch, cues for toe off with knee flexion on L and increased L stance phase Modalities: Vaso x 10 minutes L knee x 34 degrees med pressure   [facility] Adult PT Treatment:                                                DATE: 04/07/2023 Therapeutic Exercise:  Recumbent bike partial revolutions x cues for full range bacward and forward AAROM heel slides 5, 5x10" in flexed position SAQ green bolster x 20 S/L straight leg hip abd + glute set x10 SLR 2x10 S/L hip ABD 2x20 Prone knee hang x 1 min with 1.5# wt on ankle Gait: With single  crutch x 20 ft (raised crutch so handle is cane height to discourage trunk lean) With SPC working on taking full step with R LE increasing left stance time 50 ft with SPC Modalities: Vaso x 10 minutes L knee x 34 degrees med pressure                                     PATIENT EDUCATION:  Education details: Exam findings, POC, initial HEP. Reiterated importance of performing HEP at home especially within the first few weeks Person educated: Patient Education method: Explanation, Demonstration, and Handouts Education comprehension: verbalized understanding, returned demonstration, and needs further education  HOME EXERCISE PROGRAM: Access Code: [email or web address] URL: [email or web address] Date: 03/31/2023 Prepared by: Raynelle Fanning  Exercises - Supine Heel Slide with Strap  - 1 x daily - 7 x weekly - 1 sets - 10 reps - Supine Quad Set  - 1 x daily - 7 x weekly - 2-3 sets - 10 reps - 3 sec hold - Supine Knee Extension Strengthening  - 1 x daily - 7 x weekly - 2 sets - 10 reps - Supine Gluteal Sets  - 1 x daily - 7 x weekly - 2 sets - 10 reps - Hooklying Isometric Clamshell  - 1 x daily - 7 x weekly - 2 sets - 10 reps - Supine Active Straight Leg Raise (Mirrored)  - 2 x daily - 7 x weekly - 3 sets - 10 reps - Prone Knee Extension Hang  - 2 x daily - 7 x weekly - 1 sets - 3 reps - max hold - Seated Knee Extension Stretch with Chair  - 2 x daily - 7 x weekly - 1 sets - 1 reps - max hold  Patient Education - Going Up and Down Stairs With Two Rails After Surgery  ASSESSMENT:  CLINICAL IMPRESSION: Continued to progress Lt knee strength and ROM. Pt with improving gait throughout clinic today, still requires cues for posture during gait with 1 crutch. She is progressing towards goals   OBJECTIVE IMPAIRMENTS: Abnormal gait, decreased activity tolerance, decreased balance, decreased endurance, decreased mobility, difficulty walking, decreased ROM, decreased strength, impaired flexibility, and  pain.   GOALS: Goals reviewed with patient? Yes  SHORT TERM GOALS: Target date: 04/16/2023  Patient will be independent with initial HEP. Baseline:  Goal status: MET  2.  Pt will have improved L knee AROM from 10 to 100 deg Baseline:  Goal status: MET  LONG TERM GOALS: Target date: 05/14/2023  Patient will be independent with advanced/ongoing HEP to improve outcomes and carryover.  Baseline:  Goal status: INITIAL  2.  Patient will report at least 50% improvement in bilat knee pain to improve QOL. Baseline:  Goal status: INITIAL  3.  Patient will have improved L knee AROM from >/= 0 to 115 deg to safely ascend/descend stairs Baseline:  Goal status: INITIAL  4.  Patient will demonstrate improved functional LE strength as demonstrated by 5x STS </=13 sec without UE support. Baseline:  Goal status: INITIAL  5.  Patient will be able to ambulate 1000' with LRAD  and normal gait pattern without increased pain to access community.  Baseline:  Goal status: INITIAL  6. Patient will have improved FOTO score of >/=59 Baseline:  Goal status: INITIAL  PLAN:  PT FREQUENCY: 2x/week with plans to decrease to 1x/wk (she only has 23 visits for the whole year and she has already used 9 visits during last PT episode)  PT DURATION: 8 weeks  PLANNED INTERVENTIONS: Therapeutic exercises, Therapeutic activity, Neuromuscular re-education, Balance training, Gait training, Patient/Family education, Self Care, Joint mobilization, Stair training, Aquatic Therapy, Dry Needling, Electrical stimulation, Cryotherapy, Moist heat, Taping, Vasopneumatic device, Ionotophoresis 4mg /ml Dexamethasone, Manual therapy, and Re-evaluation  PLAN FOR NEXT SESSION: focus on EXTENSION, gait/stairs, Knee ROM/stretching.   Lakashia Collison, PT 04/21/2023, 12:18 PM

## 2023-04-23 ENCOUNTER — Ambulatory Visit: Payer: 59 | Admitting: Physical Therapy

## 2023-04-23 DIAGNOSIS — Z96652 Presence of left artificial knee joint: Secondary | ICD-10-CM

## 2023-04-23 DIAGNOSIS — R600 Localized edema: Secondary | ICD-10-CM

## 2023-04-23 DIAGNOSIS — G8929 Other chronic pain: Secondary | ICD-10-CM

## 2023-04-23 DIAGNOSIS — M6281 Muscle weakness (generalized): Secondary | ICD-10-CM

## 2023-04-23 DIAGNOSIS — R2689 Other abnormalities of gait and mobility: Secondary | ICD-10-CM

## 2023-04-23 NOTE — Therapy (Signed)
OUTPATIENT PHYSICAL THERAPY LOWER EXTREMITY TREATMENT   Patient Name: Lisa Everett MRN: [id number] DOB:13-Nov-1965, 57 y.o., female Today's Date: 04/23/2023  END OF SESSION:  PT End of Session - 04/23/23 1153     Visit Number 9    Number of Visits 14    Date for PT Re-Evaluation 05/14/23    Authorization Type [employer] -- 23 visit limit for the whole year    Authorization - Visit Number 9    Authorization - Number of Visits 14    PT Start Time 1147    PT Stop Time 1230    PT Time Calculation (min) 43 min    Activity Tolerance Patient tolerated treatment well    Behavior During Therapy WFL for tasks assessed/performed             Past Medical History:  Diagnosis Date   Allergy    pet dander   Arthritis    Asthma    Family history of adverse reaction to anesthesia    Sister has naseau and vomiting   Hot flashes    Hypertension    Hypokalemia    Vitamin D deficiency    Past Surgical History:  Procedure Laterality Date   CATARACT EXTRACTION Bilateral    CESAREAN SECTION     HERNIA REPAIR     x3 3rd with Dr. Sheliah Hatch 07-02-18   INCISIONAL HERNIA REPAIR N/A 07/02/2018   Procedure: HERNIA REPAIR INCISIONAL OPEN RECURRENT;  Surgeon: Kinsinger, De Blanch, MD;  Location: [facility];  Service: General;  Laterality: N/A;   INSERTION OF MESH N/A 07/02/2018   Procedure: INSERTION OF MESH;  Surgeon: Sheliah Hatch De Blanch, MD;  Location: [facility];  Service: General;  Laterality: N/A;   LAPAROTOMY N/A 07/03/2018   Procedure: EXPLORATORY LAPAROTOMY, RE-OPENING OF INCISION AND REMOVAL OF HEMATOMA;  Surgeon: Sheliah Hatch De Blanch, MD;  Location: [facility];  Service: General;  Laterality: N/A;   TOTAL KNEE ARTHROPLASTY Left 03/09/2023   Procedure: TOTAL KNEE ARTHROPLASTY;  Surgeon: Ollen Gross, MD;  Location: [facility];  Service: Orthopedics;  Laterality: Left;   WISDOM TOOTH EXTRACTION     Patient Active Problem List   Diagnosis Date Noted   OA (osteoarthritis) of knee 03/09/2023    Primary osteoarthritis of left knee 03/09/2023   Incisional hernia 07/02/2018    PCP: n/a REFERRING PROVIDER: Ollen Gross, MD REFERRING DIAG: M17.12 Unilateral primary osteoarthritis, left knee THERAPY DIAG:  Status post left knee replacement  Muscle weakness (generalized)  Localized edema  Other abnormalities of gait and mobility  Chronic pain of right knee  Chronic pain of left knee  Rationale for Evaluation and Treatment: Rehabilitation  ONSET DATE: 03/09/23 L TKA  SUBJECTIVE:  SUBJECTIVE STATEMENT: Pt reports nothing new. Has 5 visits left of PT.   PERTINENT HISTORY: Chronic knee OA  PAIN:  Are you having pain? Yes: NPRS scale: 2/10 Pain location: ant knee Pain description: Dull ache Aggravating factors: Cold weather, rain, being on my feet a lot, stairs Relieving factors: Heat/ice, pain medication  PRECAUTIONS: None  WEIGHT BEARING RESTRICTIONS: No  FALLS:  Has patient fallen in last 6 months? 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)  PATIENT GOALS: Improve pain for return to all normal function/work  NEXT MD VISIT: Next 5 weeks  OBJECTIVE:  (Measures in this section from  initial evaluation unless otherwise noted) PATIENT SURVEYS:  FOTO 35; predicted 59  EDEMA:  21" on L, 18" on R  LOWER EXTREMITY ROM: ROM Right 11/04/22 Left 11/04/22 Left Eval  Left 03/27/23 Left  03/31/23 Left 04/07/23  Hip flexion        Hip extension        Hip abduction        Hip adduction        Hip internal rotation        Hip external rotation        Knee flexion 128 A/PROM 120 A/PROM 80 AROM  88 AAROM 100 A/ROM supine 106 A/ROM  Knee extension -10 AROM 0 PROM -34 AROM 0 PROM -25 AROM -15  AROM in LAQ -7 deg A/ROM supine -7 A/ROM  Ankle dorsiflexion        Ankle  plantarflexion        Ankle inversion        Ankle eversion         (Blank rows = not tested)  LOWER EXTREMITY MMT: MMT Right eval Left eval Left 04/07/23  Hip flexion 5 2 4   Hip extension  3-   Hip abduction  3   Hip adduction     Hip internal rotation     Hip external rotation     Knee flexion 3+ 3 4+  Knee extension 4+ 2 5-  Ankle dorsiflexion     Ankle plantarflexion     Ankle inversion     Ankle eversion      (Blank rows = not tested)  FUNCTIONAL TESTS:  5x STS 18 sec 04/07/23  GAIT: Distance walked: 50'  Assistive device utilized: Environmental consultant - 2 wheeled Level of assistance: CGA Comments: flexed trunk, wide BOS, decreased L LE weight shift/weight bearing, maintains knee in mostly extension, increased L lateral trunk lean  [facility] Adult PT Treatment:                                                DATE: 04/23/23 Therapeutic Exercise: Recumbent bike x 5 min full revolutions Knee flexion on 6" step x10 Standing hamstring curl 2x10 Lumbar ext against counter x10 Sit<>stand 2x5 Neuromuscular re-ed: Step tap 6" step 2x10 Tandem stance 2x30 sec Slider fwd 2x10 Gait: L or R stance with opposing leg heel touch fwd 2x10 Staggered stance rock fwd/bwd 2x10 Fwd Amb 2x20' no a/d next to counter Modalities: Vaso x 10 min 34 degrees med pressure    [facility] Adult PT Treatment:                                                DATE: 04/21/23 Therapeutic Exercise: Recumbent bike x 5 min full revolutions Leg press DL 13# 2 x 10, SL 08# x 10 TKE ball on wall x 15 SLR 2x10 SAQ x 20 Sidelying hip abd x 20 Prone HS curl --> quad set 2 x 10 Modalities: Vaso x 10 min 34 degrees med pressure  [facility] Adult PT Treatment:  DATE: 04/16/23 Therapeutic Exercise: Recumbent bike full revolutions x Leg press DL 16# 1W96, SL 04# V40 Prone knee hang x1 min Prone knee hang + quad set 2x10 Modified deadlift with 5# KB on mat table 3x10 Neuromuscular  re-ed: Step tap 4" step 2x10 Gait: 2x40' single crutch, cues for toe off with knee flexion on L and increased L stance phase Modalities: Vaso x 10 minutes L knee x 34 degrees med pressure                                     PATIENT EDUCATION:  Education details: Exam findings, POC, initial HEP. Reiterated importance of performing HEP at home especially within the first few weeks Person educated: Patient Education method: Explanation, Demonstration, and Handouts Education comprehension: verbalized understanding, returned demonstration, and needs further education  HOME EXERCISE PROGRAM: Access Code: [email or web address] URL: [email or web address] Date: 03/31/2023 Prepared by: Raynelle Fanning  Exercises - Supine Heel Slide with Strap  - 1 x daily - 7 x weekly - 1 sets - 10 reps - Supine Quad Set  - 1 x daily - 7 x weekly - 2-3 sets - 10 reps - 3 sec hold - Supine Knee Extension Strengthening  - 1 x daily - 7 x weekly - 2 sets - 10 reps - Supine Gluteal Sets  - 1 x daily - 7 x weekly - 2 sets - 10 reps - Hooklying Isometric Clamshell  - 1 x daily - 7 x weekly - 2 sets - 10 reps - Supine Active Straight Leg Raise (Mirrored)  - 2 x daily - 7 x weekly - 3 sets - 10 reps - Prone Knee Extension Hang  - 2 x daily - 7 x weekly - 1 sets - 3 reps - max hold - Seated Knee Extension Stretch with Chair  - 2 x daily - 7 x weekly - 1 sets - 1 reps - max hold  Patient Education - Going Up and Down Stairs With Two Rails After Surgery  ASSESSMENT:  CLINICAL IMPRESSION: Worked on L LE weight shifting and hip flexion. Pain noted when pt lifts L LE up on to step. Initiated amb without a/d. Tends to flex at trunk during R LE stance phase. Highly challenged with balance/stabilization exercises.    OBJECTIVE IMPAIRMENTS: Abnormal gait, decreased activity tolerance, decreased balance, decreased endurance, decreased mobility, difficulty walking, decreased ROM, decreased strength, impaired flexibility, and pain.    GOALS: Goals reviewed with patient? Yes  SHORT TERM GOALS: Target date: 04/16/2023  Patient will be independent with initial HEP. Baseline:  Goal status: MET  2.  Pt will have improved L knee AROM from 10 to 100 deg Baseline:  Goal status: MET  LONG TERM GOALS: Target date: 05/14/2023  Patient will be independent with advanced/ongoing HEP to improve outcomes and carryover.  Baseline:  Goal status: INITIAL  2.  Patient will report at least 50% improvement in bilat knee pain to improve QOL. Baseline:  Goal status: INITIAL  3.  Patient will have improved L knee AROM from >/= 0 to 115 deg to safely ascend/descend stairs Baseline:  Goal status: INITIAL  4.  Patient will demonstrate improved functional LE strength as demonstrated by 5x STS </=13 sec without UE support. Baseline:  Goal status: INITIAL  5.  Patient will be able to ambulate 1000' with LRAD and normal gait pattern without increased pain to access community.  Baseline:  Goal  status: INITIAL  6. Patient will have improved FOTO score of >/=59 Baseline:  Goal status: INITIAL  PLAN:  PT FREQUENCY: 2x/week with plans to decrease to 1x/wk (she only has 23 visits for the whole year and she has already used 9 visits during last PT episode)  PT DURATION: 8 weeks  PLANNED INTERVENTIONS: Therapeutic exercises, Therapeutic activity, Neuromuscular re-education, Balance training, Gait training, Patient/Family education, Self Care, Joint mobilization, Stair training, Aquatic Therapy, Dry Needling, Electrical stimulation, Cryotherapy, Moist heat, Taping, Vasopneumatic device, Ionotophoresis 4mg /ml Dexamethasone, Manual therapy, and Re-evaluation  PLAN FOR NEXT SESSION: focus on EXTENSION, gait/stairs, Knee ROM/stretching.   Nalaya Wojdyla April Ma L Mccrae Speciale, PT 04/23/2023, 11:53 AM

## 2023-04-29 ENCOUNTER — Ambulatory Visit: Payer: 59 | Attending: Orthopedic Surgery | Admitting: Physical Therapy

## 2023-04-29 ENCOUNTER — Encounter: Payer: Self-pay | Admitting: Physical Therapy

## 2023-04-29 DIAGNOSIS — Z96652 Presence of left artificial knee joint: Secondary | ICD-10-CM | POA: Diagnosis present

## 2023-04-29 DIAGNOSIS — M25561 Pain in right knee: Secondary | ICD-10-CM | POA: Insufficient documentation

## 2023-04-29 DIAGNOSIS — M6281 Muscle weakness (generalized): Secondary | ICD-10-CM | POA: Insufficient documentation

## 2023-04-29 DIAGNOSIS — G8929 Other chronic pain: Secondary | ICD-10-CM | POA: Diagnosis present

## 2023-04-29 DIAGNOSIS — M25562 Pain in left knee: Secondary | ICD-10-CM | POA: Diagnosis present

## 2023-04-29 DIAGNOSIS — R600 Localized edema: Secondary | ICD-10-CM | POA: Diagnosis present

## 2023-04-29 DIAGNOSIS — R2689 Other abnormalities of gait and mobility: Secondary | ICD-10-CM | POA: Insufficient documentation

## 2023-04-29 NOTE — Therapy (Signed)
OUTPATIENT PHYSICAL THERAPY LOWER EXTREMITY TREATMENT   Patient Name: Jayceon Baldez MRN: [id number] DOB:Jul 10, 1966, 57 y.o., female Today's Date: 04/29/2023  END OF SESSION:  PT End of Session - 04/29/23 1214     Visit Number 10    Number of Visits 14    Date for PT Re-Evaluation 05/14/23    Authorization Type [employer] -- 23 visit limit for the whole year    Authorization - Visit Number 10    Authorization - Number of Visits 14    PT Start Time 1145    PT Stop Time 1230    PT Time Calculation (min) 45 min    Activity Tolerance Patient tolerated treatment well    Behavior During Therapy WFL for tasks assessed/performed              Past Medical History:  Diagnosis Date   Allergy    pet dander   Arthritis    Asthma    Family history of adverse reaction to anesthesia    Sister has naseau and vomiting   Hot flashes    Hypertension    Hypokalemia    Vitamin D deficiency    Past Surgical History:  Procedure Laterality Date   CATARACT EXTRACTION Bilateral    CESAREAN SECTION     HERNIA REPAIR     x3 3rd with Dr. Sheliah Hatch 07-02-18   INCISIONAL HERNIA REPAIR N/A 07/02/2018   Procedure: HERNIA REPAIR INCISIONAL OPEN RECURRENT;  Surgeon: Kinsinger, De Blanch, MD;  Location: [facility];  Service: General;  Laterality: N/A;   INSERTION OF MESH N/A 07/02/2018   Procedure: INSERTION OF MESH;  Surgeon: Sheliah Hatch De Blanch, MD;  Location: [facility];  Service: General;  Laterality: N/A;   LAPAROTOMY N/A 07/03/2018   Procedure: EXPLORATORY LAPAROTOMY, RE-OPENING OF INCISION AND REMOVAL OF HEMATOMA;  Surgeon: Sheliah Hatch De Blanch, MD;  Location: [facility];  Service: General;  Laterality: N/A;   TOTAL KNEE ARTHROPLASTY Left 03/09/2023   Procedure: TOTAL KNEE ARTHROPLASTY;  Surgeon: Ollen Gross, MD;  Location: [facility];  Service: Orthopedics;  Laterality: Left;   WISDOM TOOTH EXTRACTION     Patient Active Problem List   Diagnosis Date Noted   OA (osteoarthritis) of knee 03/09/2023    Primary osteoarthritis of left knee 03/09/2023   Incisional hernia 07/02/2018    PCP: n/a REFERRING PROVIDER: Ollen Gross, MD REFERRING DIAG: M17.12 Unilateral primary osteoarthritis, left knee THERAPY DIAG:  Status post left knee replacement  Muscle weakness (generalized)  Localized edema  Other abnormalities of gait and mobility  Rationale for Evaluation and Treatment: Rehabilitation  ONSET DATE: 03/09/23 L TKA  SUBJECTIVE:  SUBJECTIVE STATEMENT: Pt reports nothing new. Has 5 visits left of PT.   PERTINENT HISTORY: Chronic knee OA  PAIN:  Are you having pain? Yes: NPRS scale: 2/10 Pain location: ant knee Pain description: Dull ache Aggravating factors: Cold weather, rain, being on my feet a lot, stairs Relieving factors: Heat/ice, pain medication  PRECAUTIONS: None  WEIGHT BEARING RESTRICTIONS: No  FALLS:  Has patient fallen in last 6 months? 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)  PATIENT GOALS: Improve pain for return to all normal function/work  NEXT MD VISIT: Next 5 weeks  OBJECTIVE:  (Measures in this section from initial evaluation unless otherwise noted) PATIENT SURVEYS:  FOTO 35; predicted  59  EDEMA:  21" on L, 18" on R  LOWER EXTREMITY ROM: ROM Right 11/04/22 Left 11/04/22 Left Eval  Left 03/27/23 Left  03/31/23 Left 04/07/23  Hip flexion        Hip extension        Hip abduction        Hip adduction        Hip internal rotation        Hip external rotation        Knee flexion 128 A/PROM 120 A/PROM 80 AROM  88 AAROM 100 A/ROM supine 106 A/ROM  Knee extension -10 AROM 0 PROM -34 AROM 0 PROM -25 AROM -15  AROM in LAQ -7 deg A/ROM supine -7 A/ROM  Ankle dorsiflexion        Ankle plantarflexion        Ankle inversion        Ankle  eversion         (Blank rows = not tested)  LOWER EXTREMITY MMT: MMT Right eval Left eval Left 04/07/23  Hip flexion 5 2 4   Hip extension  3-   Hip abduction  3   Hip adduction     Hip internal rotation     Hip external rotation     Knee flexion 3+ 3 4+  Knee extension 4+ 2 5-  Ankle dorsiflexion     Ankle plantarflexion     Ankle inversion     Ankle eversion      (Blank rows = not tested)  FUNCTIONAL TESTS:  5x STS 18 sec 04/07/23  GAIT: Distance walked: 50'  Assistive device utilized: Environmental consultant - 2 wheeled Level of assistance: CGA Comments: flexed trunk, wide BOS, decreased L LE weight shift/weight bearing, maintains knee in mostly extension, increased L lateral trunk lean  [facility] Adult PT Treatment:                                                DATE: 04/29/23 Therapeutic Exercise: Nustep L6 x 5 min for warm up and ROM Lt knee flexion ROM and strength tapping over pool noodle in standing Standing HS curl x 20 Seated knee flexion tapping 6'' step without compensation  Neuromuscular re-ed: Tandem stance 2x30 sec Step tap 6" step 2x10  Gait: Pre gait stepping over/back pool noodle focus on wt shifts and Lt knee flexion Side stepping, fwd/bkwd step without AD Gait x 20' with turns without AD Modalities: Vaso x 10 min 34 degrees med pressure   [facility] Adult PT Treatment:                                                DATE: 04/23/23 Therapeutic Exercise: Recumbent bike x 5 min full revolutions Knee flexion on 6" step x10 Standing hamstring curl 2x10 Lumbar ext against counter x10 Sit<>stand 2x5 Neuromuscular re-ed: Step tap 6" step 2x10 Tandem stance 2x30 sec Slider fwd 2x10 Gait: L or R stance with opposing leg heel touch fwd 2x10 Staggered stance rock fwd/bwd 2x10 Fwd Amb 2x20' no a/d next to counter Modalities: Vaso x 10 min 34 degrees med pressure    [facility] Adult PT Treatment:  DATE: 04/21/23 Therapeutic  Exercise: Recumbent bike x 5 min full revolutions Leg press DL 16# 2 x 10, SL 10# x 10 TKE ball on wall x 15 SLR 2x10 SAQ x 20 Sidelying hip abd x 20 Prone HS curl --> quad set 2 x 10 Modalities: Vaso x 10 min 34 degrees med pressure                                      PATIENT EDUCATION:  Education details: Exam findings, POC, initial HEP. Reiterated importance of performing HEP at home especially within the first few weeks Person educated: Patient Education method: Explanation, Demonstration, and Handouts Education comprehension: verbalized understanding, returned demonstration, and needs further education  HOME EXERCISE PROGRAM: Access Code: [email or web address] URL: [email or web address] Date: 03/31/2023 Prepared by: Raynelle Fanning  Exercises - Supine Heel Slide with Strap  - 1 x daily - 7 x weekly - 1 sets - 10 reps - Supine Quad Set  - 1 x daily - 7 x weekly - 2-3 sets - 10 reps - 3 sec hold - Supine Knee Extension Strengthening  - 1 x daily - 7 x weekly - 2 sets - 10 reps - Supine Gluteal Sets  - 1 x daily - 7 x weekly - 2 sets - 10 reps - Hooklying Isometric Clamshell  - 1 x daily - 7 x weekly - 2 sets - 10 reps - Supine Active Straight Leg Raise (Mirrored)  - 2 x daily - 7 x weekly - 3 sets - 10 reps - Prone Knee Extension Hang  - 2 x daily - 7 x weekly - 1 sets - 3 reps - max hold - Seated Knee Extension Stretch with Chair  - 2 x daily - 7 x weekly - 1 sets - 1 reps - max hold  Patient Education - Going Up and Down Stairs With Two Rails After Surgery  ASSESSMENT:  CLINICAL IMPRESSION: Pt continues with antalgic gait with decreased Lt knee flexion and decreased stance on Lt despite pregait activities and cuing. Discussed using RW or shopping cart to normalize gait mechanics at home   OBJECTIVE IMPAIRMENTS: Abnormal gait, decreased activity tolerance, decreased balance, decreased endurance, decreased mobility, difficulty walking, decreased ROM, decreased strength,  impaired flexibility, and pain.   GOALS: Goals reviewed with patient? Yes  SHORT TERM GOALS: Target date: 04/16/2023  Patient will be independent with initial HEP. Baseline:  Goal status: MET  2.  Pt will have improved L knee AROM from 10 to 100 deg Baseline:  Goal status: MET  LONG TERM GOALS: Target date: 05/14/2023  Patient will be independent with advanced/ongoing HEP to improve outcomes and carryover.  Baseline:  Goal status: INITIAL  2.  Patient will report at least 50% improvement in bilat knee pain to improve QOL. Baseline:  Goal status: INITIAL  3.  Patient will have improved L knee AROM from >/= 0 to 115 deg to safely ascend/descend stairs Baseline:  Goal status: INITIAL  4.  Patient will demonstrate improved functional LE strength as demonstrated by 5x STS </=13 sec without UE support. Baseline:  Goal status: INITIAL  5.  Patient will be able to ambulate 1000' with LRAD and normal gait pattern without increased pain to access community.  Baseline:  Goal status: INITIAL  6. Patient will have improved FOTO score of >/=59 Baseline:  Goal status: INITIAL  PLAN:  PT FREQUENCY: 2x/week with plans to decrease  to 1x/wk (she only has 23 visits for the whole year and she has already used 9 visits during last PT episode)  PT DURATION: 8 weeks  PLANNED INTERVENTIONS: Therapeutic exercises, Therapeutic activity, Neuromuscular re-education, Balance training, Gait training, Patient/Family education, Self Care, Joint mobilization, Stair training, Aquatic Therapy, Dry Needling, Electrical stimulation, Cryotherapy, Moist heat, Taping, Vasopneumatic device, Ionotophoresis 4mg /ml Dexamethasone, Manual therapy, and Re-evaluation  PLAN FOR NEXT SESSION: focus on EXTENSION, gait/stairs, Knee ROM/stretching.   Ociel Retherford, PT 04/29/2023, 12:16 PM

## 2023-05-06 ENCOUNTER — Ambulatory Visit: Payer: 59

## 2023-05-12 ENCOUNTER — Ambulatory Visit: Payer: 59 | Admitting: Physical Therapy

## 2023-05-19 ENCOUNTER — Encounter: Payer: Self-pay | Admitting: Physical Therapy

## 2023-05-19 ENCOUNTER — Ambulatory Visit: Payer: 59 | Admitting: Physical Therapy

## 2023-05-19 DIAGNOSIS — R600 Localized edema: Secondary | ICD-10-CM

## 2023-05-19 DIAGNOSIS — M6281 Muscle weakness (generalized): Secondary | ICD-10-CM

## 2023-05-19 DIAGNOSIS — R2689 Other abnormalities of gait and mobility: Secondary | ICD-10-CM

## 2023-05-19 DIAGNOSIS — Z96652 Presence of left artificial knee joint: Secondary | ICD-10-CM

## 2023-05-19 DIAGNOSIS — G8929 Other chronic pain: Secondary | ICD-10-CM

## 2023-05-19 NOTE — Therapy (Signed)
OUTPATIENT PHYSICAL THERAPY LOWER EXTREMITY TREATMENT   Patient Name: Lisa Everett MRN: [id number] DOB:Jan 06, 1966, 57 y.o., female Today's Date: 05/19/2023  END OF SESSION:  PT End of Session - 05/19/23 1145     Visit Number 11    Number of Visits 14    Date for PT Re-Evaluation 05/14/23    Authorization Type [employer] -- 23 visit limit for the whole year    Authorization - Visit Number 11    Authorization - Number of Visits 14    PT Start Time 1145    PT Stop Time 1230    PT Time Calculation (min) 45 min    Activity Tolerance Patient tolerated treatment well    Behavior During Therapy WFL for tasks assessed/performed              Past Medical History:  Diagnosis Date   Allergy    pet dander   Arthritis    Asthma    Family history of adverse reaction to anesthesia    Sister has naseau and vomiting   Hot flashes    Hypertension    Hypokalemia    Vitamin D deficiency    Past Surgical History:  Procedure Laterality Date   CATARACT EXTRACTION Bilateral    CESAREAN SECTION     HERNIA REPAIR     x3 3rd with Dr. Sheliah Hatch 07-02-18   INCISIONAL HERNIA REPAIR N/A 07/02/2018   Procedure: HERNIA REPAIR INCISIONAL OPEN RECURRENT;  Surgeon: Kinsinger, De Blanch, MD;  Location: [facility];  Service: General;  Laterality: N/A;   INSERTION OF MESH N/A 07/02/2018   Procedure: INSERTION OF MESH;  Surgeon: Sheliah Hatch De Blanch, MD;  Location: [facility];  Service: General;  Laterality: N/A;   LAPAROTOMY N/A 07/03/2018   Procedure: EXPLORATORY LAPAROTOMY, RE-OPENING OF INCISION AND REMOVAL OF HEMATOMA;  Surgeon: Sheliah Hatch De Blanch, MD;  Location: [facility];  Service: General;  Laterality: N/A;   TOTAL KNEE ARTHROPLASTY Left 03/09/2023   Procedure: TOTAL KNEE ARTHROPLASTY;  Surgeon: Ollen Gross, MD;  Location: [facility];  Service: Orthopedics;  Laterality: Left;   WISDOM TOOTH EXTRACTION     Patient Active Problem List   Diagnosis Date Noted   OA (osteoarthritis) of knee 03/09/2023    Primary osteoarthritis of left knee 03/09/2023   Incisional hernia 07/02/2018    PCP: n/a REFERRING PROVIDER: Ollen Gross, MD REFERRING DIAG: M17.12 Unilateral primary osteoarthritis, left knee THERAPY DIAG:  Status post left knee replacement  Muscle weakness (generalized)  Localized edema  Other abnormalities of gait and mobility  Chronic pain of right knee  Chronic pain of left knee  Rationale for Evaluation and Treatment: Rehabilitation  ONSET DATE: 03/09/23 L TKA  SUBJECTIVE:  SUBJECTIVE STATEMENT: Pt states she's been doing all right. Pt states knee still feels tight. Pt has been walking with the cane since Saturday. Pt reports she's been walking okay with the cane (has been using her brother in law's). Pt reports it swells off/on. Pt will be seeing ortho tomorrow.   PERTINENT HISTORY: Chronic knee OA  PAIN:  Are you having pain? Yes: NPRS scale: 2/10 Pain location: ant knee Pain description: Dull ache Aggravating factors: Cold weather, rain, being on my feet a lot, stairs Relieving factors: Heat/ice, pain medication  PRECAUTIONS: None  WEIGHT BEARING RESTRICTIONS: No  FALLS:  Has patient fallen in last 6 months? 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)  PATIENT GOALS: Improve pain for return to all normal function/work  NEXT MD VISIT: Next 5 weeks  OBJECTIVE:  (Measures in this section from initial evaluation unless otherwise noted) PATIENT SURVEYS:  FOTO 35; predicted 59  EDEMA:  21" on L, 18" on R  LOWER EXTREMITY ROM: ROM Right 11/04/22 Left 11/04/22 Left Eval  Left 03/27/23 Left  03/31/23 Left 04/07/23 Left 05/19/23  Hip flexion         Hip extension         Hip abduction         Hip adduction         Hip internal rotation         Hip  external rotation         Knee flexion 128 A/PROM 120 A/PROM 80 AROM  88 AAROM 100 A/ROM supine 106 A/ROM 115 AROM  Knee extension -10 AROM 0 PROM -34 AROM 0 PROM -25 AROM -15  AROM in LAQ -7 deg A/ROM supine -7 A/ROM -10 AROM -5 PROM  Ankle dorsiflexion         Ankle plantarflexion         Ankle inversion         Ankle eversion          (Blank rows = not tested)  LOWER EXTREMITY MMT: MMT Right eval Left eval Left 04/07/23  Hip flexion 5 2 4   Hip extension  3-   Hip abduction  3   Hip adduction     Hip internal rotation     Hip external rotation     Knee flexion 3+ 3 4+  Knee extension 4+ 2 5-  Ankle dorsiflexion     Ankle plantarflexion     Ankle inversion     Ankle eversion      (Blank rows = not tested)  FUNCTIONAL TESTS:  5x STS 18 sec 04/07/23 5x STS 22 sec 05/19/23  GAIT: Distance walked: 50'  Assistive device utilized: Environmental consultant - 2 wheeled Level of assistance: CGA Comments: flexed trunk, wide BOS, decreased L LE weight shift/weight bearing, maintains knee in mostly extension, increased L lateral trunk lean  [facility] Adult PT Treatment:                                                DATE: 05/19/23 Therapeutic Exercise: Recumbent bike L2 x 5 min Prone Knee hang x 1 min  Supine Quad set 2x10 SLR 2x10 Sit<>stand 2x5 Manual Therapy: Scar massage Grade II to III knee mobilization for knee extension Therapeutic Activity: Rechecking pt's goals Amb 5 laps around gym with SPC SBA -- cues to keep hips/trunk extended Therapeutic Activity: Reviewed appropriate HEP and scar massage techniques for home  [facility] Adult PT Treatment:                                                DATE: 04/29/23 Therapeutic Exercise: Nustep L6 x 5 min for warm up and ROM Lt knee flexion ROM and strength tapping over pool noodle in standing Standing HS curl x 20 Seated knee flexion tapping 6'' step without compensation Neuromuscular re-ed: Tandem stance 2x30 sec  Step tap 6" step  2x10 Gait: Pre gait stepping over/back pool noodle focus on wt shifts and Lt knee flexion Side stepping, fwd/bkwd step without AD Gait x 20' with turns without AD Modalities: Vaso x 10 min 34 degrees med pressure   [facility] Adult PT Treatment:                                                DATE: 04/23/23 Therapeutic Exercise: Recumbent bike x 5 min full revolutions Knee flexion on 6" step x10 Standing hamstring curl 2x10 Lumbar ext against counter x10 Sit<>stand 2x5 Neuromuscular re-ed: Step tap 6" step 2x10 Tandem stance 2x30 sec Slider fwd 2x10 Gait: L or R stance with opposing leg heel touch fwd 2x10 Staggered stance rock fwd/bwd 2x10 Fwd Amb 2x20' no a/d next to counter Modalities: Vaso x 10 min 34 degrees med pressure                                      PATIENT EDUCATION:  Education details: Exam findings, POC, initial HEP. Reiterated importance of performing HEP at home especially within the first few weeks Person educated: Patient Education method: Explanation, Demonstration, and Handouts Education comprehension: verbalized understanding, returned demonstration, and needs further education  HOME EXERCISE PROGRAM: Access Code: [email or web address] URL: [email or web address] Date: 03/31/2023 Prepared by: Raynelle Fanning  Exercises - Supine Heel Slide with Strap  - 1 x daily - 7 x weekly - 1 sets - 10 reps - Supine Quad Set  - 1 x daily - 7 x weekly - 2-3 sets - 10 reps - 3 sec hold - Supine Knee Extension Strengthening  - 1 x daily - 7 x weekly - 2 sets - 10 reps - Supine Gluteal Sets  - 1 x daily - 7 x weekly - 2 sets - 10 reps - Hooklying Isometric Clamshell  - 1 x daily - 7 x weekly - 2 sets - 10 reps - Supine Active Straight Leg Raise (Mirrored)  - 2 x daily - 7 x weekly - 3 sets - 10 reps - Prone Knee Extension Hang  - 2 x daily - 7 x weekly - 1 sets - 3 reps - max hold - Seated Knee Extension Stretch with Chair  - 2 x daily - 7 x weekly - 1 sets - 1 reps - max  hold  Patient Education - Going Up and Down Stairs With Two Rails After Surgery  ASSESSMENT:  CLINICAL IMPRESSION: Pt continues to lack in full knee extension and demos continued quad weakness and decreased endurance. Pt unable to tolerate performing SLR for a full 2 sets of 10 reps. Educated pt on scar massaging to decrease knee tightness. Continued gait training with SPC -- pt is demonstrating improving gait pattern and increased weight shift but due to her quad weakness is still not safe to be ind without a/d. Pt has only partially met her goals at this time. Discussed continuing to PT for another month. Pt interested in one more visit after she starts work in a week. Pt would greatly benefit from more PT visits to address her continued gait instability, work on weaning off cane and quad strengthening.   OBJECTIVE IMPAIRMENTS: Abnormal gait, decreased activity tolerance, decreased balance, decreased endurance, decreased mobility, difficulty walking, decreased ROM,  decreased strength, impaired flexibility, and pain.   GOALS: Goals reviewed with patient? Yes  SHORT TERM GOALS: Target date: 04/16/2023  Patient will be independent with initial HEP. Baseline:  Goal status: MET  2.  Pt will have improved L knee AROM from 10 to 100 deg Baseline:  Goal status: MET  LONG TERM GOALS: Target date: 06/16/2023   Patient will be independent with advanced/ongoing HEP to improve outcomes and carryover.  Baseline:  Goal status: MET  2.  Patient will report at least 50% improvement in bilat knee pain to improve QOL. Baseline:  05/19/23: 80% improvement Goal status: MET  3.  Patient will have improved L knee AROM from >/= 0 to 115 deg to safely ascend/descend stairs Baseline:  05/19/23: -5 to 115 deg Goal status: PARTIALLY MET  4.  Patient will demonstrate improved functional LE strength as demonstrated by 5x STS </=13 sec without UE support. Baseline:  Goal status: NOT MET  5.  Patient  will be able to ambulate 1000' with LRAD and normal gait pattern without increased pain to access community.  Baseline:  05/19/23: 5x80' in clinic today with SPC Goal status: IN PROGRESS  6. Patient will have improved FOTO score of >/=59 Baseline:  05/19/23: 47 Goal status: IN PROGRESS  PLAN:  PT FREQUENCY: 2x/week with plans to decrease to 1x/wk (she only has 23 visits for the whole year and she has already used 9 visits during last PT episode)  PT DURATION: 8 weeks  PLANNED INTERVENTIONS: Therapeutic exercises, Therapeutic activity, Neuromuscular re-education, Balance training, Gait training, Patient/Family education, Self Care, Joint mobilization, Stair training, Aquatic Therapy, Dry Needling, Electrical stimulation, Cryotherapy, Moist heat, Taping, Vasopneumatic device, Ionotophoresis 4mg /ml Dexamethasone, Manual therapy, and Re-evaluation  PLAN FOR NEXT SESSION: focus on EXTENSION, gait/stairs, Knee ROM/stretching.   Jareb Radoncic April Ma L Jodye Scali, PT 05/19/2023, 11:45 AM

## 2023-07-01 ENCOUNTER — Ambulatory Visit: Payer: 59 | Attending: Orthopedic Surgery | Admitting: Physical Therapy

## 2023-07-01 ENCOUNTER — Encounter: Payer: Self-pay | Admitting: Physical Therapy

## 2023-07-01 DIAGNOSIS — R600 Localized edema: Secondary | ICD-10-CM | POA: Insufficient documentation

## 2023-07-01 DIAGNOSIS — R2689 Other abnormalities of gait and mobility: Secondary | ICD-10-CM | POA: Insufficient documentation

## 2023-07-01 DIAGNOSIS — Z96652 Presence of left artificial knee joint: Secondary | ICD-10-CM | POA: Insufficient documentation

## 2023-07-01 DIAGNOSIS — M6281 Muscle weakness (generalized): Secondary | ICD-10-CM | POA: Insufficient documentation

## 2023-07-01 NOTE — Therapy (Signed)
OUTPATIENT PHYSICAL THERAPY LOWER EXTREMITY TREATMENT AND DISCHARGE   Patient Name: Lisa Everett MRN: [id number] DOB:Jan 15, 1966, 57 y.o., female Today's Date: 07/01/2023  END OF SESSION:  PT End of Session - 07/01/23 1349     Visit Number 12    Number of Visits 14    Date for PT Re-Evaluation 07/01/23    Authorization Type [employer] -- 23 visit limit for the whole year    Authorization - Visit Number 12    Authorization - Number of Visits 14    PT Start Time 1315    PT Stop Time 1355    PT Time Calculation (min) 40 min    Activity Tolerance Patient tolerated treatment well    Behavior During Therapy WFL for tasks assessed/performed               Past Medical History:  Diagnosis Date   Allergy    pet dander   Arthritis    Asthma    Family history of adverse reaction to anesthesia    Sister has naseau and vomiting   Hot flashes    Hypertension    Hypokalemia    Vitamin D deficiency    Past Surgical History:  Procedure Laterality Date   CATARACT EXTRACTION Bilateral    CESAREAN SECTION     HERNIA REPAIR     x3 3rd with Dr. Sheliah Hatch 07-02-18   INCISIONAL HERNIA REPAIR N/A 07/02/2018   Procedure: HERNIA REPAIR INCISIONAL OPEN RECURRENT;  Surgeon: Kinsinger, De Blanch, MD;  Location: [facility];  Service: General;  Laterality: N/A;   INSERTION OF MESH N/A 07/02/2018   Procedure: INSERTION OF MESH;  Surgeon: Sheliah Hatch De Blanch, MD;  Location: [facility];  Service: General;  Laterality: N/A;   LAPAROTOMY N/A 07/03/2018   Procedure: EXPLORATORY LAPAROTOMY, RE-OPENING OF INCISION AND REMOVAL OF HEMATOMA;  Surgeon: Sheliah Hatch De Blanch, MD;  Location: [facility];  Service: General;  Laterality: N/A;   TOTAL KNEE ARTHROPLASTY Left 03/09/2023   Procedure: TOTAL KNEE ARTHROPLASTY;  Surgeon: Ollen Gross, MD;  Location: [facility];  Service: Orthopedics;  Laterality: Left;   WISDOM TOOTH EXTRACTION     Patient Active Problem List   Diagnosis Date Noted   OA (osteoarthritis) of  knee 03/09/2023   Primary osteoarthritis of left knee 03/09/2023   Incisional hernia 07/02/2018    PCP: n/a REFERRING PROVIDER: Ollen Gross, MD REFERRING DIAG: M17.12 Unilateral primary osteoarthritis, left knee THERAPY DIAG:  Status post left knee replacement  Muscle weakness (generalized)  Localized edema  Other abnormalities of gait and mobility  Rationale for Evaluation and Treatment: Rehabilitation  ONSET DATE: 03/09/23 L TKA  SUBJECTIVE:  SUBJECTIVE STATEMENT: Pt has returned to work. She was initially using SPC but now is able to walk without device. She states initially she was moving "slow" but that now she feels she is walking faster. She states no pain but "sore" sometimes.  PERTINENT HISTORY: Chronic knee OA  PAIN:  Are you having pain? Yes: NPRS scale: 2/10 Pain location: ant knee Pain description: Dull ache Aggravating factors: Cold weather, rain, being on my feet a lot, stairs Relieving factors: Heat/ice, pain medication  PRECAUTIONS: None  WEIGHT BEARING RESTRICTIONS: No  FALLS:  Has patient fallen in last 6 months? 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)  PATIENT GOALS: Improve  pain for return to all normal function/work  NEXT MD VISIT: Next 5 weeks  OBJECTIVE:  (Measures in this section from initial evaluation unless otherwise noted) PATIENT SURVEYS:  FOTO 35; predicted 59  EDEMA:  21" on L, 18" on R  LOWER EXTREMITY ROM: ROM Right 11/04/22 Left 11/04/22 Left Eval  Left 03/27/23 Left  03/31/23 Left 04/07/23 Left 05/19/23 Left 07/01/23  Hip flexion          Hip extension          Hip abduction          Hip adduction          Hip internal rotation          Hip external rotation          Knee flexion 128 A/PROM 120 A/PROM 80 AROM  88 AAROM  100 A/ROM supine 106 A/ROM 115 AROM 124 AROM  Knee extension -10 AROM 0 PROM -34 AROM 0 PROM -25 AROM -15  AROM in LAQ -7 deg A/ROM supine -7 A/ROM -10 AROM -5 PROM -3 AROM  Ankle dorsiflexion          Ankle plantarflexion          Ankle inversion          Ankle eversion           (Blank rows = not tested)  LOWER EXTREMITY MMT: MMT Right eval Left eval Left 04/07/23  Hip flexion 5 2 4   Hip extension  3-   Hip abduction  3   Hip adduction     Hip internal rotation     Hip external rotation     Knee flexion 3+ 3 4+  Knee extension 4+ 2 5-  Ankle dorsiflexion     Ankle plantarflexion     Ankle inversion     Ankle eversion      (Blank rows = not tested)  FUNCTIONAL TESTS:  5x STS 18 sec 04/07/23 5x STS 22 sec 05/19/23  GAIT: Distance walked: 50'  Assistive device utilized: Environmental consultant - 2 wheeled Level of assistance: CGA Comments: flexed trunk, wide BOS, decreased L LE weight shift/weight bearing, maintains knee in mostly extension, increased L lateral trunk lean  [facility] Adult PT Treatment:                                                DATE: 06/30/22 Therapeutic Exercise: Recumbent bike x 5 min L3 ROM (see above) Gait forward and backward focus on knee ROM and decreasing antalgic gait Step ups 6'' step 2 x 10 with light UE support Gait outdoors on grass and incline/decline with slow gait speed but no LOB, no pain  Modalities: Vaso x 10 min low pressure 34 degrees   [facility] Adult PT Treatment:                                                DATE: 05/19/23 Therapeutic Exercise: Recumbent bike L2 x 5 min Prone Knee hang x 1 min  Supine Quad set 2x10 SLR 2x10 Sit<>stand 2x5 Manual Therapy: Scar massage Grade II to III knee mobilization for knee extension Therapeutic Activity: Rechecking pt's goals Amb 5 laps around gym with SPC SBA -- cues  to keep hips/trunk extended Therapeutic Activity: Reviewed appropriate HEP and scar massage techniques for home  [facility] Adult PT  Treatment:                                                DATE: 04/29/23 Therapeutic Exercise: Nustep L6 x 5 min for warm up and ROM Lt knee flexion ROM and strength tapping over pool noodle in standing Standing HS curl x 20 Seated knee flexion tapping 6'' step without compensation Neuromuscular re-ed: Tandem stance 2x30 sec Step tap 6" step 2x10 Gait: Pre gait stepping over/back pool noodle focus on wt shifts and Lt knee flexion Side stepping, fwd/bkwd step without AD Gait x 20' with turns without AD Modalities: Vaso x 10 min 34 degrees med pressure   [facility] Adult PT Treatment:                                                DATE: 04/23/23 Therapeutic Exercise: Recumbent bike x 5 min full revolutions Knee flexion on 6" step x10 Standing hamstring curl 2x10 Lumbar ext against counter x10 Sit<>stand 2x5 Neuromuscular re-ed: Step tap 6" step 2x10 Tandem stance 2x30 sec Slider fwd 2x10 Gait: L or R stance with opposing leg heel touch fwd 2x10 Staggered stance rock fwd/bwd 2x10 Fwd Amb 2x20' no a/d next to counter Modalities: Vaso x 10 min 34 degrees med pressure                                      PATIENT EDUCATION:  Education details: Exam findings, POC, initial HEP. Reiterated importance of performing HEP at home especially within the first few weeks Person educated: Patient Education method: Explanation, Demonstration, and Handouts Education comprehension: verbalized understanding, returned demonstration, and needs further education  HOME EXERCISE PROGRAM: Access Code: [email or web address] URL: [email or web address] Date: 03/31/2023 Prepared by: Raynelle Fanning  Exercises - Supine Heel Slide with Strap  - 1 x daily - 7 x weekly - 1 sets - 10 reps - Supine Quad Set  - 1 x daily - 7 x weekly - 2-3 sets - 10 reps - 3 sec hold - Supine Knee Extension Strengthening  - 1 x daily - 7 x weekly - 2 sets - 10 reps - Supine Gluteal Sets  - 1 x daily - 7 x weekly - 2 sets - 10 reps -  Hooklying Isometric Clamshell  - 1 x daily - 7 x weekly - 2 sets - 10 reps - Supine Active Straight Leg Raise (Mirrored)  - 2 x daily - 7 x weekly - 3 sets - 10 reps - Prone Knee Extension Hang  - 2 x daily - 7 x weekly - 1 sets - 3 reps - max hold - Seated Knee Extension Stretch with Chair  - 2 x daily - 7 x weekly - 1 sets - 1 reps - max hold  Patient Education - Going Up and Down Stairs With Two Rails After Surgery  ASSESSMENT:  CLINICAL IMPRESSION: Pt has met most goals as she has improved functional mobility and decreased pain. She has improved ROM but continues with antalgic gait. Pt feels ready to d/c to  HEP   OBJECTIVE IMPAIRMENTS: Abnormal gait, decreased activity tolerance, decreased balance, decreased endurance, decreased mobility, difficulty walking, decreased ROM, decreased strength, impaired flexibility, and pain.   GOALS: Goals reviewed with patient? Yes  SHORT TERM GOALS: Target date: 04/16/2023  Patient will be independent with initial HEP. Baseline:  Goal status: MET  2.  Pt will have improved L knee AROM from 10 to 100 deg Baseline:  Goal status: MET  LONG TERM GOALS: Target date: 06/16/2023   Patient will be independent with advanced/ongoing HEP to improve outcomes and carryover.  Baseline:  Goal status: MET  2.  Patient will report at least 50% improvement in bilat knee pain to improve QOL. Baseline:  05/19/23: 80% improvement Goal status: MET  3.  Patient will have improved L knee AROM from >/= 0 to 115 deg to safely ascend/descend stairs Baseline:  05/19/23: -5 to 115 deg Goal status: PARTIALLY MET  4.  Patient will demonstrate improved functional LE strength as demonstrated by 5x STS </=13 sec without UE support. Baseline:  Goal status: NOT MET  5.  Patient will be able to ambulate 1000' with LRAD and normal gait pattern without increased pain to access community.  Baseline:  05/19/23: 5x80' in clinic today with SPC Goal status: IN  PROGRESS  6. Patient will have improved FOTO score of >/=59 Baseline:  05/19/23: 47 Goal status: MET  PLAN:  PT FREQUENCY: 2x/week with plans to decrease to 1x/wk (she only has 23 visits for the whole year and she has already used 9 visits during last PT episode)  PT DURATION: 8 weeks  PLANNED INTERVENTIONS: Therapeutic exercises, Therapeutic activity, Neuromuscular re-education, Balance training, Gait training, Patient/Family education, Self Care, Joint mobilization, Stair training, Aquatic Therapy, Dry Needling, Electrical stimulation, Cryotherapy, Moist heat, Taping, Vasopneumatic device, Ionotophoresis 4mg /ml Dexamethasone, Manual therapy, and Re-evaluation  PLAN FOR NEXT SESSION: d/c  PHYSICAL THERAPY DISCHARGE SUMMARY  Visits from Start of Care: 12  Current functional level related to goals / functional outcomes: Improved ROM and strength, decreased pain   Remaining deficits: Impaired gait   Education / Equipment: HEP   Patient agrees to discharge. Patient goals were partially met. Patient is being discharged due to being pleased with the current functional level.   Sherrelle Prochazka, PT 07/01/2023, 1:49 PM

## 2023-08-10 ENCOUNTER — Other Ambulatory Visit: Payer: Self-pay | Admitting: Internal Medicine

## 2023-08-10 DIAGNOSIS — Z1231 Encounter for screening mammogram for malignant neoplasm of breast: Secondary | ICD-10-CM

## 2023-09-02 ENCOUNTER — Ambulatory Visit (INDEPENDENT_AMBULATORY_CARE_PROVIDER_SITE_OTHER): Payer: 59 | Admitting: Podiatry

## 2023-09-02 ENCOUNTER — Encounter: Payer: Self-pay | Admitting: Podiatry

## 2023-09-02 DIAGNOSIS — B351 Tinea unguium: Secondary | ICD-10-CM | POA: Diagnosis not present

## 2023-09-02 MED ORDER — TERBINAFINE HCL 250 MG PO TABS: 250.0000 mg | ORAL_TABLET | Freq: Every day | ORAL | 0 refills | Status: AC

## 2023-09-03 NOTE — Progress Notes (Signed)
Subjective:   Patient ID: Lisa Everett, female   DOB: 57 y.o.   MRN: [id number]   HPI Patient presents with concerns about discoloration of nailbeds and states it has been around 8 months and she is concerned about fungus ingrown or other nail pathology.  No change in health history   ROS      Objective:  Physical Exam  Neuro vascular status intact range of motion adequate patient found to have discoloration of Nailbeds and there also may be some incurvation in the corners at the same time.  He will doing better from previous visits     Assessment:  Mycotic nail infection bilateral with ingrown component with discoloration       Plan:  Chronic mycotic nail infection dystrophic changes with the possibility also for nail trauma.  I spent a great deal of time educating her on the differences between trauma and nail fungus and at this point organ to start her on topical formula 7 along with a pulse antifungal of Lamisil see results and decide what else might be necessary

## 2023-09-10 ENCOUNTER — Ambulatory Visit
Admission: RE | Admit: 2023-09-10 | Discharge: 2023-09-10 | Disposition: A | Discharge status: Home / Self Care | Payer: Self-pay | Source: Ambulatory Visit | Attending: Internal Medicine | Admitting: Internal Medicine

## 2023-09-10 DIAGNOSIS — Z1231 Encounter for screening mammogram for malignant neoplasm of breast: Secondary | ICD-10-CM

## 2024-08-16 ENCOUNTER — Other Ambulatory Visit: Payer: Self-pay | Admitting: Internal Medicine

## 2024-08-16 DIAGNOSIS — Z1231 Encounter for screening mammogram for malignant neoplasm of breast: Secondary | ICD-10-CM

## 2024-09-13 ENCOUNTER — Ambulatory Visit
Admission: RE | Admit: 2024-09-13 | Discharge: 2024-09-13 | Disposition: A | Source: Ambulatory Visit | Attending: Internal Medicine | Admitting: Internal Medicine

## 2024-09-13 DIAGNOSIS — Z1231 Encounter for screening mammogram for malignant neoplasm of breast: Secondary | ICD-10-CM

## 2024-09-15 LAB — COLOGUARD: COLOGUARD: NEGATIVE
